# Patient Record
Sex: Male | Born: 1970
Health system: Southern US, Community
[De-identification: ages and names within clinical notes are randomized; demographics above are authoritative.]

## PROBLEM LIST (undated history)

## (undated) DIAGNOSIS — I10 Essential (primary) hypertension: Secondary | ICD-10-CM

## (undated) DIAGNOSIS — I2510 Atherosclerotic heart disease of native coronary artery without angina pectoris: Secondary | ICD-10-CM

## (undated) HISTORY — DX: Atherosclerotic heart disease of native coronary artery without angina pectoris: I25.10

## (undated) HISTORY — PX: KNEE ARTHROSCOPY: SHX127

## (undated) HISTORY — DX: Essential (primary) hypertension: I10

---

## 1998-05-07 ENCOUNTER — Emergency Department (HOSPITAL_COMMUNITY): Admission: EM | Admit: 1998-05-07 | Discharge: 1998-05-07 | Payer: Self-pay | Admitting: Emergency Medicine

## 1998-05-17 ENCOUNTER — Ambulatory Visit (HOSPITAL_BASED_OUTPATIENT_CLINIC_OR_DEPARTMENT_OTHER): Admission: RE | Admit: 1998-05-17 | Discharge: 1998-05-17 | Payer: Self-pay | Admitting: Orthopedic Surgery

## 1999-05-02 ENCOUNTER — Encounter: Admission: RE | Admit: 1999-05-02 | Discharge: 1999-07-31 | Payer: Self-pay | Admitting: Family Medicine

## 2001-02-03 ENCOUNTER — Emergency Department (HOSPITAL_COMMUNITY): Admission: EM | Admit: 2001-02-03 | Discharge: 2001-02-03 | Payer: Self-pay | Admitting: Emergency Medicine

## 2006-03-25 ENCOUNTER — Ambulatory Visit: Payer: Self-pay | Admitting: Family Medicine

## 2006-03-25 ENCOUNTER — Encounter: Admission: RE | Admit: 2006-03-25 | Discharge: 2006-03-25 | Payer: Self-pay | Admitting: Family Medicine

## 2006-04-09 ENCOUNTER — Encounter: Admission: RE | Admit: 2006-04-09 | Discharge: 2006-06-04 | Payer: Self-pay | Admitting: Family Medicine

## 2006-05-16 ENCOUNTER — Ambulatory Visit: Payer: Self-pay | Admitting: Family Medicine

## 2006-05-27 ENCOUNTER — Encounter: Admission: RE | Admit: 2006-05-27 | Discharge: 2006-05-27 | Payer: Self-pay | Admitting: Family Medicine

## 2007-09-06 ENCOUNTER — Ambulatory Visit: Payer: Self-pay | Admitting: Family Medicine

## 2008-01-28 ENCOUNTER — Ambulatory Visit: Payer: Self-pay | Admitting: Internal Medicine

## 2008-01-28 DIAGNOSIS — L989 Disorder of the skin and subcutaneous tissue, unspecified: Secondary | ICD-10-CM | POA: Insufficient documentation

## 2008-01-28 DIAGNOSIS — Z9889 Other specified postprocedural states: Secondary | ICD-10-CM | POA: Insufficient documentation

## 2009-06-12 ENCOUNTER — Emergency Department (HOSPITAL_BASED_OUTPATIENT_CLINIC_OR_DEPARTMENT_OTHER): Admission: EM | Admit: 2009-06-12 | Discharge: 2009-06-12 | Payer: Self-pay | Admitting: Emergency Medicine

## 2009-06-20 ENCOUNTER — Ambulatory Visit: Payer: Self-pay | Admitting: Family Medicine

## 2010-08-14 ENCOUNTER — Ambulatory Visit: Payer: Self-pay | Admitting: Family Medicine

## 2010-08-15 DIAGNOSIS — R21 Rash and other nonspecific skin eruption: Secondary | ICD-10-CM | POA: Insufficient documentation

## 2010-09-28 NOTE — Assessment & Plan Note (Signed)
Summary: KNOTS ON BACK AND CHEST//PH   Vital Signs:  Patient profile:   40 year old male Height:      71 inches Weight:      210.6 pounds BMI:     29.48 Temp:     98.5 degrees F oral BP sitting:   130 / 90  (left arm) Cuff size:   large  Vitals Entered By: Almeta Monas CMA Duncan Dull) (August 15, 2010 3:52 PM) CC: c/o knot on the back---pt noticed it 4 days ago, says it is tender   History of Present Illness: Pt here c/o rash on chest and back that is itchy.  He has not used any otc meds.  No new meds, lotions, soaps etc.  They do have dogs but they are mostly in doors.  He first noticed area on his back and then it spread to his chest.    Current Medications (verified): 1)  Lotrisone 1-0.05 % Crea (Clotrimazole-Betamethasone) .... Apply Two Times A Day  Allergies (verified): No Known Drug Allergies  Past History:  Past Medical History: Last updated: 01/28/2008 hx of HTN 10years ago -- was treated with medication  Family History: Last updated: 01/28/2008 CAD  - no HTN- no stroke - no DM - no colon Ca -no prostate Ca -no  Social History: Last updated: 01/28/2008 Never Smoked Alcohol use-no Drug use-no  Risk Factors: Smoking Status: never (01/28/2008)  Family History: Reviewed history from 01/28/2008 and no changes required. CAD  - no HTN- no stroke - no DM - no colon Ca -no prostate Ca -no  Social History: Reviewed history from 01/28/2008 and no changes required. Never Smoked Alcohol use-no Drug use-no  Review of Systems      See HPI  Physical Exam  General:  Well-developed,well-nourished,in no acute distress; alert,appropriate and cooperative throughout examination Skin:  + Patch scaley papules on L side upper back and same kind of patch on L side upper abd  Cervical Nodes:  No lymphadenopathy noted Psych:  Cognition and judgment appear intact. Alert and cooperative with normal attention span and concentration. No apparent delusions, illusions,  hallucinations   Impression & Recommendations:  Problem # 1:  SKIN RASH (ICD-782.1)  His updated medication list for this problem includes:    Lotrisone 1-0.05 % Crea (Clotrimazole-betamethasone) .Marland Kitchen... Apply two times a day  Discussed medication use and symptom control.   Complete Medication List: 1)  Lotrisone 1-0.05 % Crea (Clotrimazole-betamethasone) .... Apply two times a day Prescriptions: LOTRISONE 1-0.05 % CREA (CLOTRIMAZOLE-BETAMETHASONE) apply two times a day  #30g x 1   Entered and Authorized by:   Loreen Freud DO   Signed by:   Loreen Freud DO on 08/15/2010   Method used:   Electronically to        [facility] [phone]* (retail)       [address]       [place], [place]  [address]       Ph: [phone]       Fax: [phone]   RxID:   [id number]    Orders Added: 1)  Est. Patient Level III [36644]

## 2011-10-26 ENCOUNTER — Ambulatory Visit: Payer: Self-pay | Admitting: Family Medicine

## 2011-11-30 ENCOUNTER — Encounter: Payer: Self-pay | Admitting: Family Medicine

## 2011-11-30 ENCOUNTER — Ambulatory Visit (INDEPENDENT_AMBULATORY_CARE_PROVIDER_SITE_OTHER): Payer: BC Managed Care – PPO | Admitting: Family Medicine

## 2011-11-30 VITALS — BP 128/90 | HR 77 | Temp 98.1°F | Ht 70.0 in | Wt 216.0 lb

## 2011-11-30 DIAGNOSIS — Z23 Encounter for immunization: Secondary | ICD-10-CM

## 2011-11-30 DIAGNOSIS — R5383 Other fatigue: Secondary | ICD-10-CM

## 2011-11-30 DIAGNOSIS — Z0000 Encounter for general adult medical examination without abnormal findings: Secondary | ICD-10-CM

## 2011-11-30 DIAGNOSIS — R5381 Other malaise: Secondary | ICD-10-CM

## 2011-11-30 NOTE — Patient Instructions (Signed)
Preventive Care for Adults, Male A healthy lifestyle and preventative care can promote health and wellness. Preventative health guidelines for men include the following key practices:  A routine yearly physical is a good way to check with your caregiver about your health and preventative screening. It is a chance to share any concerns and updates on your health, and to receive a thorough exam.   Visit your dentist for a routine exam and preventative care every 6 months. Brush your teeth twice a day and floss once a day. Good oral hygiene prevents tooth decay and gum disease.   The frequency of eye exams is based on your age, health, family medical history, use of contact lenses, and other factors. Follow your caregiver's recommendations for frequency of eye exams.   Eat a healthy diet. Foods like vegetables, fruits, whole grains, low-fat dairy products, and lean protein foods contain the nutrients you need without too many calories. Decrease your intake of foods high in solid fats, added sugars, and salt. Eat the right amount of calories for you.Get information about a proper diet from your caregiver, if necessary.   Regular physical exercise is one of the most important things you can do for your health. Most adults should get at least 150 minutes of moderate-intensity exercise (any activity that increases your heart rate and causes you to sweat) each week. In addition, most adults need muscle-strengthening exercises on 2 or more days a week.   Maintain a healthy weight. The body mass index (BMI) is a screening tool to identify possible weight problems. It provides an estimate of body fat based on height and weight. Your caregiver can help determine your BMI, and can help you achieve or maintain a healthy weight.For adults 20 years and older:   A BMI below 18.5 is considered underweight.   A BMI of 18.5 to 24.9 is normal.   A BMI of 25 to 29.9 is considered overweight.   A BMI of 30 and above  is considered obese.   Maintain normal blood lipids and cholesterol levels by exercising and minimizing your intake of saturated fat. Eat a balanced diet with plenty of fruit and vegetables. Blood tests for lipids and cholesterol should begin at age 20 and be repeated every 5 years. If your lipid or cholesterol levels are high, you are over 50, or you are a high risk for heart disease, you may need your cholesterol levels checked more frequently.Ongoing high lipid and cholesterol levels should be treated with medicines if diet and exercise are not effective.   If you smoke, find out from your caregiver how to quit. If you do not use tobacco, do not start.   If you choose to drink alcohol, do not exceed 2 drinks per day. One drink is considered to be 12 ounces (355 mL) of beer, 5 ounces (148 mL) of wine, or 1.5 ounces (44 mL) of liquor.   Avoid use of street drugs. Do not share needles with anyone. Ask for help if you need support or instructions about stopping the use of drugs.   High blood pressure causes heart disease and increases the risk of stroke. Your blood pressure should be checked at least every 1 to 2 years. Ongoing high blood pressure should be treated with medicines, if weight loss and exercise are not effective.   If you are 45 to 42 years old, ask your caregiver if you should take aspirin to prevent heart disease.   Diabetes screening involves taking a blood   sample to check your fasting blood sugar level. This should be done once every 3 years, after age 45, if you are within normal weight and without risk factors for diabetes. Testing should be considered at a younger age or be carried out more frequently if you are overweight and have at least 1 risk factor for diabetes.   Colorectal cancer can be detected and often prevented. Most routine colorectal cancer screening begins at the age of 50 and continues through age 75. However, your caregiver may recommend screening at an earlier  age if you have risk factors for colon cancer. On a yearly basis, your caregiver may provide home test kits to check for hidden blood in the stool. Use of a small camera at the end of a tube, to directly examine the colon (sigmoidoscopy or colonoscopy), can detect the earliest forms of colorectal cancer. Talk to your caregiver about this at age 50, when routine screening begins. Direct examination of the colon should be repeated every 5 to 10 years through age 75, unless early forms of pre-cancerous polyps or small growths are found.   Hepatitis C blood testing is recommended for all people born from 1945 through 1965 and any individual with known risks for hepatitis C.   Practice safe sex. Use condoms and avoid high-risk sexual practices to reduce the spread of sexually transmitted infections (STIs). STIs include gonorrhea, chlamydia, syphilis, trichomonas, herpes, HPV, and human immunodeficiency virus (HIV). Herpes, HIV, and HPV are viral illnesses that have no cure. They can result in disability, cancer, and death.   A one-time screening for abdominal aortic aneurysm (AAA) and surgical repair of large AAAs by sound wave imaging (ultrasonography) is recommended for ages 65 to 75 years who are current or former smokers.   Healthy men should no longer receive prostate-specific antigen (PSA) blood tests as part of routine cancer screening. Consult with your caregiver about prostate cancer screening.   Testicular cancer screening is not recommended for adult males who have no symptoms. Screening includes self-exam, caregiver exam, and other screening tests. Consult with your caregiver about any symptoms you have or any concerns you have about testicular cancer.   Use sunscreen with skin protection factor (SPF) of 30 or more. Apply sunscreen liberally and repeatedly throughout the day. You should seek shade when your shadow is shorter than you. Protect yourself by wearing long sleeves, pants, a  wide-brimmed hat, and sunglasses year round, whenever you are outdoors.   Once a month, do a whole body skin exam, using a mirror to look at the skin on your back. Notify your caregiver of new moles, moles that have irregular borders, moles that are larger than a pencil eraser, or moles that have changed in shape or color.   Stay current with required immunizations.   Influenza. You need a dose every fall (or winter). The composition of the flu vaccine changes each year, so being vaccinated once is not enough.   Pneumococcal polysaccharide. You need 1 to 2 doses if you smoke cigarettes or if you have certain chronic medical conditions. You need 1 dose at age 65 (or older) if you have never been vaccinated.   Tetanus, diphtheria, pertussis (Tdap, Td). Get 1 dose of Tdap vaccine if you are younger than age 65 years, are over 65 and have contact with an infant, are a healthcare worker, or simply want to be protected from whooping cough. After that, you need a Td booster dose every 10 years. Consult your caregiver if   you have not had at least 3 tetanus and diphtheria-containing shots sometime in your life or have a deep or dirty wound.   HPV. This vaccine is recommended for males 13 through 42 years of age. This vaccine may be given to men 22 through 42 years of age who have not completed the 3 dose series. It is recommended for men through age 26 who have sex with men or whose immune system is weakened because of HIV infection, other illness, or medications. The vaccine is given in 3 doses over 6 months.   Measles, mumps, rubella (MMR). You need at least 1 dose of MMR if you were born in 1957 or later. You may also need a 2nd dose.   Meningococcal. If you are age 19 to 21 years and a first-year college student living in a residence hall, or have one of several medical conditions, you need to get vaccinated against meningococcal disease. You may also need additional booster doses.   Zoster (shingles).  If you are age 60 years or older, you should get this vaccine.   Varicella (chickenpox). If you have never had chickenpox or you were vaccinated but received only 1 dose, talk to your caregiver to find out if you need this vaccine.   Hepatitis A. You need this vaccine if you have a specific risk factor for hepatitis A virus infection, or you simply wish to be protected from this disease. The vaccine is usually given as 2 doses, 6 to 18 months apart.   Hepatitis B. You need this vaccine if you have a specific risk factor for hepatitis B virus infection or you simply wish to be protected from this disease. The vaccine is given in 3 doses, usually over 6 months.  Preventative Service / Frequency Ages 19 to 39  Blood pressure check.** / Every 1 to 2 years.   Lipid and cholesterol check.** / Every 5 years beginning at age 20.   Hepatitis C blood test.** / For any individual with known risks for hepatitis C.   Skin self-exam. / Monthly.   Influenza immunization.** / Every year.   Pneumococcal polysaccharide immunization.** / 1 to 2 doses if you smoke cigarettes or if you have certain chronic medical conditions.   Tetanus, diphtheria, pertussis (Tdap,Td) immunization. / A one-time dose of Tdap vaccine. After that, you need a Td booster dose every 10 years.   HPV immunization. / 3 doses over 6 months, if 26 and younger.   Measles, mumps, rubella (MMR) immunization. / You need at least 1 dose of MMR if you were born in 1957 or later. You may also need a 2nd dose.   Meningococcal immunization. / 1 dose if you are age 19 to 21 years and a first-year college student living in a residence hall, or have one of several medical conditions, you need to get vaccinated against meningococcal disease. You may also need additional booster doses.   Varicella immunization.** / Consult your caregiver.   Hepatitis A immunization.** / Consult your caregiver. 2 doses, 6 to 18 months apart.   Hepatitis B  immunization.** / Consult your caregiver. 3 doses usually over 6 months.  Ages 40 to 64  Blood pressure check.** / Every 1 to 2 years.   Lipid and cholesterol check.** / Every 5 years beginning at age 20.   Fecal occult blood test (FOBT) of stool. / Every year beginning at age 50 and continuing until age 75. You may not have to do this test if   you get colonoscopy every 10 years.   Flexible sigmoidoscopy** or colonoscopy.** / Every 5 years for a flexible sigmoidoscopy or every 10 years for a colonoscopy beginning at age 50 and continuing until age 75.   Hepatitis C blood test.** / For all people born from 1945 through 1965 and any individual with known risks for hepatitis C.   Skin self-exam. / Monthly.   Influenza immunization.** / Every year.   Pneumococcal polysaccharide immunization.** / 1 to 2 doses if you smoke cigarettes or if you have certain chronic medical conditions.   Tetanus, diphtheria, pertussis (Tdap/Td) immunization.** / A one-time dose of Tdap vaccine. After that, you need a Td booster dose every 10 years.   Measles, mumps, rubella (MMR) immunization. / You need at least 1 dose of MMR if you were born in 1957 or later. You may also need a 2nd dose.   Varicella immunization.**/ Consult your caregiver.   Meningococcal immunization.** / Consult your caregiver.   Hepatitis A immunization.** / Consult your caregiver. 2 doses, 6 to 18 months apart.   Hepatitis B immunization.** / Consult your caregiver. 3 doses, usually over 6 months.  Ages 65 and over  Blood pressure check.** / Every 1 to 2 years.   Lipid and cholesterol check.**/ Every 5 years beginning at age 20.   Fecal occult blood test (FOBT) of stool. / Every year beginning at age 50 and continuing until age 75. You may not have to do this test if you get colonoscopy every 10 years.   Flexible sigmoidoscopy** or colonoscopy.** / Every 5 years for a flexible sigmoidoscopy or every 10 years for a colonoscopy  beginning at age 50 and continuing until age 75.   Hepatitis C blood test.** / For all people born from 1945 through 1965 and any individual with known risks for hepatitis C.   Abdominal aortic aneurysm (AAA) screening.** / A one-time screening for ages 65 to 75 years who are current or former smokers.   Skin self-exam. / Monthly.   Influenza immunization.** / Every year.   Pneumococcal polysaccharide immunization.** / 1 dose at age 65 (or older) if you have never been vaccinated.   Tetanus, diphtheria, pertussis (Tdap, Td) immunization. / A one-time dose of Tdap vaccine if you are over 65 and have contact with an infant, are a healthcare worker, or simply want to be protected from whooping cough. After that, you need a Td booster dose every 10 years.   Varicella immunization. ** / Consult your caregiver.   Meningococcal immunization.** / Consult your caregiver.   Hepatitis A immunization. ** / Consult your caregiver. 2 doses, 6 to 18 months apart.   Hepatitis B immunization.** / Check with your caregiver. 3 doses, usually over 6 months.  **Family history and personal history of risk and conditions may change your caregiver's recommendations. Document Released: 10/09/2001 Document Revised: 08/02/2011 Document Reviewed: 01/08/2011 [employer] Patient Information 2012 [employer]. 

## 2011-11-30 NOTE — Progress Notes (Signed)
Subjective:    Patient ID: Chad Chase, male    DOB: 1970-11-26, 41 y.o.   MRN: [id number]  HPI Pt here for cpe and labs.  Pt c/o fatigue and is asking about testosterone and other labs.    Review of Systems Review of Systems  Constitutional: Negative for activity change, appetite change and fatigue.  HENT: Negative for hearing loss, congestion, tinnitus and ear discharge.  dentist 1year ago Eyes: Negative for visual disturbance (see optho q3y).  Respiratory: Negative for cough, chest tightness and shortness of breath.   Cardiovascular: Negative for chest pain, palpitations and leg swelling.  Gastrointestinal: Negative for abdominal pain, diarrhea, constipation and abdominal distention.  Genitourinary: Negative for urgency, frequency, decreased urine volume and difficulty urinating.  Musculoskeletal: Negative for back pain, arthralgias and gait problem.  Skin: Negative for color change, pallor and rash.  Neurological: Negative for dizziness, light-headedness, numbness and headaches.  Hematological: Negative for adenopathy. Does not bruise/bleed easily.  Psychiatric/Behavioral: Negative for suicidal ideas, confusion, sleep disturbance, self-injury, dysphoric mood, decreased concentration and agitation.   Past Medical History  Diagnosis Date  . Hypertension     10 years ago--treated with medication   History   Social History  . Marital Status: Married    Spouse Name: N/A    Number of Children: N/A  . Years of Education: N/A   Occupational History  . self employed    Social History Main Topics  . Smoking status: Never Smoker   . Smokeless tobacco: Never Used  . Alcohol Use: No  . Drug Use: No  . Sexually Active: Yes -- Male partner(s)   Other Topics Concern  . Not on file   Social History Narrative  . No narrative on file   Family History  Problem Relation Age of Onset  . Hypertension Mother          Objective:   Physical Exam  BP 128/90  Pulse 77   Temp(Src) 98.1 F (36.7 C) (Oral)  Ht 5\' 10"  (1.778 m)  Wt 216 lb (97.977 kg)  BMI 30.99 kg/m2  SpO2 97%  General Appearance:    Alert, cooperative, no distress, appears stated age  Head:    Normocephalic, without obvious abnormality, atraumatic  Eyes:    PERRL, conjunctiva/corneas clear, EOM's intact, fundi    benign, both eyes       Ears:    Normal TM's and external ear canals, both ears  Nose:   Nares normal, septum midline, mucosa normal, no drainage   or sinus tenderness  Throat:   Lips, mucosa, and tongue normal; teeth and gums normal  Neck:   Supple, symmetrical, trachea midline, no adenopathy;       thyroid:  No enlargement/tenderness/nodules; no carotid   bruit or JVD  Back:     Symmetric, no curvature, ROM normal, no CVA tenderness  Lungs:     Clear to auscultation bilaterally, respirations unlabored  Chest wall:    No tenderness or deformity  Heart:    Regular rate and rhythm, S1 and S2 normal, no murmur, rub   or gallop  Abdomen:     Soft, non-tender, bowel sounds active all four quadrants,    no masses, no organomegaly  Genitalia:    Normal male without lesion, discharge or tenderness  Rectal:    Normal tone, normal prostate, no masses or tenderness;   guaiac negative stool  Extremities:   Extremities normal, atraumatic, no cyanosis or edema  Pulses:   2+ and symmetric  all extremities  Skin:   Skin color, texture, turgor normal, no rashes or lesions  Lymph nodes:   Cervical, supraclavicular, and axillary nodes normal  Neurologic:   CNII-XII intact. Normal strength, sensation and reflexes      throughout        Assessment & Plan:  cpe---  ghm utd            Check labs             Dtap given Elevated BP---watch salt,  Diet and exercise                          Recheck 3 months  Fatigue---check labs, including testosterone

## 2011-12-07 ENCOUNTER — Other Ambulatory Visit: Payer: BC Managed Care – PPO

## 2011-12-07 LAB — POCT URINALYSIS DIPSTICK
Bilirubin, UA: NEGATIVE
Glucose, UA: NEGATIVE
Ketones, UA: NEGATIVE
Leukocytes, UA: NEGATIVE
pH, UA: 5

## 2011-12-07 LAB — BASIC METABOLIC PANEL
Chloride: 104 mEq/L (ref 96–112)
Potassium: 4.2 mEq/L (ref 3.5–5.1)

## 2011-12-07 LAB — TSH: TSH: 1.7 u[IU]/mL (ref 0.35–5.50)

## 2011-12-07 LAB — HEPATIC FUNCTION PANEL
ALT: 26 U/L (ref 0–53)
AST: 32 U/L (ref 0–37)
Bilirubin, Direct: 0.1 mg/dL (ref 0.0–0.3)
Total Protein: 7.2 g/dL (ref 6.0–8.3)

## 2011-12-07 LAB — LDL CHOLESTEROL, DIRECT: Direct LDL: 221.8 mg/dL

## 2011-12-07 LAB — CBC WITH DIFFERENTIAL/PLATELET
Basophils Relative: 0.9 % (ref 0.0–3.0)
Eosinophils Relative: 1.9 % (ref 0.0–5.0)
HCT: 39.8 % (ref 39.0–52.0)
Lymphs Abs: 1.8 10*3/uL (ref 0.7–4.0)
MCV: 88.3 fl (ref 78.0–100.0)
Monocytes Absolute: 0.5 10*3/uL (ref 0.1–1.0)
Neutrophils Relative %: 46.2 % (ref 43.0–77.0)
RBC: 4.51 Mil/uL (ref 4.22–5.81)
WBC: 4.5 10*3/uL (ref 4.5–10.5)

## 2011-12-07 LAB — LIPID PANEL: Triglycerides: 126 mg/dL (ref 0.0–149.0)

## 2011-12-10 LAB — TESTOSTERONE, FREE, TOTAL, SHBG
Sex Hormone Binding: 21 nmol/L (ref 13–71)
Testosterone, Free: 78.8 pg/mL (ref 47.0–244.0)
Testosterone: 314.81 ng/dL (ref 300–890)

## 2012-03-27 ENCOUNTER — Ambulatory Visit: Payer: BC Managed Care – PPO | Admitting: Family Medicine

## 2014-07-27 ENCOUNTER — Ambulatory Visit (INDEPENDENT_AMBULATORY_CARE_PROVIDER_SITE_OTHER): Payer: BC Managed Care – PPO | Admitting: Family Medicine

## 2014-07-27 ENCOUNTER — Encounter: Payer: Self-pay | Admitting: Family Medicine

## 2014-07-27 VITALS — BP 124/80 | HR 54 | Temp 98.4°F | Wt 231.0 lb

## 2014-07-27 DIAGNOSIS — H02823 Cysts of right eye, unspecified eyelid: Secondary | ICD-10-CM

## 2014-07-27 NOTE — Progress Notes (Signed)
   Subjective:    Patient ID: Chad Chase, male    DOB: Oct 26, 1970, 43 y.o.   MRN: [id number]  HPI Pt here c/o bump in eyelid x 3 weeks that is growing and becoming more painful.   Review of Systems     Objective:   Physical Exam        Assessment & Plan:

## 2014-07-27 NOTE — Patient Instructions (Signed)

## 2014-07-27 NOTE — Progress Notes (Signed)
Pre visit review using our clinic review tool, if applicable. No additional management support is needed unless otherwise documented below in the visit note. 

## 2015-09-21 ENCOUNTER — Telehealth: Payer: Self-pay | Admitting: *Deleted

## 2015-09-21 NOTE — Telephone Encounter (Signed)
At time of Pre-Visit Call, pt requests to reschedule appt for tomorrow. Call transferred to Schedulers.

## 2015-09-22 ENCOUNTER — Encounter: Payer: Self-pay | Admitting: Family Medicine

## 2015-09-29 ENCOUNTER — Encounter: Payer: Self-pay | Admitting: Family Medicine

## 2015-09-30 ENCOUNTER — Encounter: Payer: Self-pay | Admitting: Family Medicine

## 2015-09-30 ENCOUNTER — Telehealth: Payer: Self-pay | Admitting: Family Medicine

## 2015-09-30 NOTE — Telephone Encounter (Signed)
Marked to charge and mailing no show letter °

## 2015-09-30 NOTE — Telephone Encounter (Signed)
charge 

## 2015-09-30 NOTE — Telephone Encounter (Signed)
Pt was no show for cpe 09/29/15 9:30am, pt has not rescheduled, charge or no charge?

## 2015-11-28 ENCOUNTER — Telehealth: Payer: Self-pay

## 2015-11-28 NOTE — Telephone Encounter (Signed)
[facility] note received via fax  Call  Date: 11/25/15 Time:  7:17:50 pm  Caller: [employer] Pet[employer]icki Capitano (Spouse) Return number:  [phone]  Nurse: Lutricia HorsfallAmy Lovelace, RN  Chief Complaint:  Abdominal pain  Reason for call:  Caller states husband has cough, sore throat, and abdominal pain since last night.  Cold symptoms last week and took OTC meds.    Related visit to physician within the last 2 weeks: No  Guideline:  Cough-Acute Productive---Cough with cold symptoms  Disposition:  Home Care

## 2015-11-28 NOTE — Telephone Encounter (Signed)
Called to follow up with patient.  Wife answered and said that patient has gone back to work.  He's feeling somewhat better.  Cough improved, but throat still mildly irritated.  She says that she will check on him and if he continues to have symptoms, she will have him call us to schedule an appt.    Message routed to PCP for FYI.

## 2015-11-28 NOTE — Telephone Encounter (Signed)
noted 

## 2015-11-29 ENCOUNTER — Ambulatory Visit: Payer: Self-pay | Admitting: Medical

## 2015-11-29 DIAGNOSIS — J069 Acute upper respiratory infection, unspecified: Secondary | ICD-10-CM | POA: Diagnosis not present

## 2016-06-21 ENCOUNTER — Encounter: Payer: Self-pay | Admitting: Family Medicine

## 2016-06-21 ENCOUNTER — Ambulatory Visit (INDEPENDENT_AMBULATORY_CARE_PROVIDER_SITE_OTHER): Payer: BLUE CROSS/BLUE SHIELD | Admitting: Family Medicine

## 2016-06-21 VITALS — BP 142/100 | HR 67 | Temp 97.8°F | Resp 16 | Ht 72.0 in | Wt 228.8 lb

## 2016-06-21 DIAGNOSIS — Z0000 Encounter for general adult medical examination without abnormal findings: Secondary | ICD-10-CM | POA: Diagnosis not present

## 2016-06-21 DIAGNOSIS — R350 Frequency of micturition: Secondary | ICD-10-CM | POA: Diagnosis not present

## 2016-06-21 DIAGNOSIS — I10 Essential (primary) hypertension: Secondary | ICD-10-CM

## 2016-06-21 LAB — LIPID PANEL
CHOL/HDL RATIO: 8
Cholesterol: 318 mg/dL — ABNORMAL HIGH (ref 0–200)
HDL: 40.2 mg/dL (ref >39.00)
LDL Cholesterol: 257 mg/dL — ABNORMAL HIGH (ref 0–99)
NONHDL: 277.7
Triglycerides: 102 mg/dL (ref 0.0–149.0)
VLDL: 20.4 mg/dL (ref 0.0–40.0)

## 2016-06-21 LAB — COMPREHENSIVE METABOLIC PANEL
ALT: 27 U/L (ref 0–53)
AST: 26 U/L (ref 0–37)
Albumin: 4.3 g/dL (ref 3.5–5.2)
Alkaline Phosphatase: 65 U/L (ref 39–117)
BILIRUBIN TOTAL: 0.4 mg/dL (ref 0.2–1.2)
BUN: 14 mg/dL (ref 6–23)
CO2: 30 meq/L (ref 19–32)
Calcium: 10 mg/dL (ref 8.4–10.5)
Chloride: 103 mEq/L (ref 96–112)
Creatinine, Ser: 1.36 mg/dL (ref 0.40–1.50)
GFR: 72.49 mL/min (ref >60.00)
GLUCOSE: 101 mg/dL — AB (ref 70–99)
Potassium: 4.7 mEq/L (ref 3.5–5.1)
SODIUM: 139 meq/L (ref 135–145)
Total Protein: 7.4 g/dL (ref 6.0–8.3)

## 2016-06-21 LAB — CBC WITH DIFFERENTIAL/PLATELET
BASOS PCT: 0.5 % (ref 0.0–3.0)
Basophils Absolute: 0 10*3/uL (ref 0.0–0.1)
EOS PCT: 0.6 % (ref 0.0–5.0)
Eosinophils Absolute: 0 10*3/uL (ref 0.0–0.7)
HEMATOCRIT: 40.2 % (ref 39.0–52.0)
HEMOGLOBIN: 13.3 g/dL (ref 13.0–17.0)
Lymphocytes Relative: 37.4 % (ref 12.0–46.0)
Lymphs Abs: 2.1 10*3/uL (ref 0.7–4.0)
MCHC: 33 g/dL (ref 30.0–36.0)
MCV: 84.7 fl (ref 78.0–100.0)
MONO ABS: 0.6 10*3/uL (ref 0.1–1.0)
Monocytes Relative: 10.5 % (ref 3.0–12.0)
Neutro Abs: 2.8 10*3/uL (ref 1.4–7.7)
Neutrophils Relative %: 51 % (ref 43.0–77.0)
Platelets: 325 10*3/uL (ref 150.0–400.0)
RBC: 4.75 Mil/uL (ref 4.22–5.81)
RDW: 13.4 % (ref 11.5–15.5)
WBC: 5.5 10*3/uL (ref 4.0–10.5)

## 2016-06-21 LAB — POCT URINALYSIS DIPSTICK
BILIRUBIN UA: NEGATIVE
Glucose, UA: NEGATIVE
KETONES UA: NEGATIVE
Leukocytes, UA: NEGATIVE
Nitrite, UA: NEGATIVE
PH UA: 6
Protein, UA: NEGATIVE
RBC UA: NEGATIVE
Urobilinogen, UA: NEGATIVE

## 2016-06-21 LAB — PSA: PSA: 0.38 ng/mL (ref 0.10–4.00)

## 2016-06-21 LAB — TSH: TSH: 1.22 u[IU]/mL (ref 0.35–4.50)

## 2016-06-21 MED ORDER — AMLODIPINE BESYLATE 5 MG PO TABS: 5.0000 mg | ORAL_TABLET | Freq: Every day | ORAL | 3 refills | Status: DC

## 2016-06-21 NOTE — Progress Notes (Signed)
Patient ID: Chad ConnorsBarry L Chase, male    DOB: [birth year]  Age: 45 y.o. MRN: [id number]    Subjective:  Subjective  HPI Chad Chase presents for c/o frequent urination-- he is concerned about prostate cancer because his father was just diagnosed with prostate cancer.   Pt also concerned about a cough he has had for 3 weeks--- it has calmed down some  Review of Systems  Constitutional: Negative for appetite change, diaphoresis, fatigue and unexpected weight change.  Eyes: Negative for pain, redness and visual disturbance.  Respiratory: Negative for cough, chest tightness, shortness of breath and wheezing.   Cardiovascular: Negative for chest pain, palpitations and leg swelling.  Endocrine: Negative for cold intolerance, heat intolerance, polydipsia, polyphagia and polyuria.  Genitourinary: Negative for difficulty urinating, dysuria and frequency.  Neurological: Negative for dizziness, light-headedness, numbness and headaches.    History Past Medical History:  Diagnosis Date  . Hypertension    10 years ago--treated with medication    He has no past surgical history on file.   His family history includes Hypertension in his mother; Prostate cancer in his father.He reports that he has never smoked. He has never used smokeless tobacco. He reports that he does not drink alcohol or use drugs.  No current outpatient prescriptions on file prior to visit.   No current facility-administered medications on file prior to visit.      Objective:  Objective  Physical Exam  Constitutional: He is oriented to person, place, and time. Vital signs are normal. He appears well-developed and well-nourished. He is sleeping.  HENT:  Head: Normocephalic and atraumatic.  Mouth/Throat: Oropharynx is clear and moist.  Eyes: EOM are normal. Pupils are equal, round, and reactive to light.  Neck: Normal range of motion. Neck supple. No thyromegaly present.  Cardiovascular: Normal rate and regular rhythm.   No  murmur heard. Pulmonary/Chest: Effort normal and breath sounds normal. No respiratory distress. He has no wheezes. He has no rales. He exhibits no tenderness.  Genitourinary: Rectum normal and prostate normal. Rectal exam shows guaiac negative stool.  Musculoskeletal: He exhibits no edema or tenderness.  Neurological: He is alert and oriented to person, place, and time.  Skin: Skin is warm and dry.  Psychiatric: He has a normal mood and affect. His behavior is normal. Judgment and thought content normal.  Nursing note and vitals reviewed.  BP (!) 142/100   Pulse 67   Temp 97.8 F (36.6 C) (Oral)   Resp 16   Ht 6' (1.829 m)   Wt 228 lb 12.8 oz (103.8 kg)   SpO2 96%   BMI 31.03 kg/m  Wt Readings from Last 3 Encounters:  06/21/16 228 lb 12.8 oz (103.8 kg)  07/27/14 231 lb (104.8 kg)  11/30/11 216 lb (98 kg)     Lab Results  Component Value Date   WBC 5.5 06/21/2016   HGB 13.3 06/21/2016   HCT 40.2 06/21/2016   PLT 325.0 06/21/2016   GLUCOSE 101 (H) 06/21/2016   CHOL 318 (H) 06/21/2016   TRIG 102.0 06/21/2016   HDL 40.20 06/21/2016   LDLDIRECT 221.8 12/07/2011   LDLCALC 257 (H) 06/21/2016   ALT 27 06/21/2016   AST 26 06/21/2016   NA 139 06/21/2016   K 4.7 06/21/2016   CL 103 06/21/2016   CREATININE 1.36 06/21/2016   BUN 14 06/21/2016   CO2 30 06/21/2016   TSH 1.22 06/21/2016   PSA 0.38 06/21/2016    No results found.   Assessment &  Plan:  Plan  I am having Chad Chase start on amLODipine.  Meds ordered this encounter  Medications  . amLODipine (NORVASC) 5 MG tablet    Sig: Take 1 tablet (5 mg total) by mouth daily.    Dispense:  30 tablet    Refill:  3    Problem List Items Addressed This Visit    None    Visit Diagnoses    Essential hypertension    -  Primary   Relevant Medications   amLODipine (NORVASC) 5 MG tablet   Other Relevant Orders   Lipid panel (Completed)   Comprehensive metabolic panel (Completed)   Urine frequency       Relevant Orders    POCT Urinalysis Dipstick (Completed)   PSA (Completed)   Preventative health care       Relevant Orders   Lipid panel (Completed)   Comprehensive metabolic panel (Completed)   POCT urinalysis dipstick   PSA (Completed)   TSH (Completed)   CBC with Differential/Platelet (Completed)   Ambulatory referral to Gastroenterology      Follow-up: Return in about 2 weeks (around 07/05/2016) for hypertension.  Schedule cpe as well  Donato Schultz, DO

## 2016-06-21 NOTE — Progress Notes (Signed)
Pre visit review using our clinic review tool, if applicable. No additional management support is needed unless otherwise documented below in the visit note. 

## 2016-06-21 NOTE — Patient Instructions (Signed)
DASH Eating Plan  DASH stands for "Dietary Approaches to Stop Hypertension." The DASH eating plan is a healthy eating plan that has been shown to reduce high blood pressure (hypertension). Additional health benefits may include reducing the risk of type 2 diabetes mellitus, heart disease, and stroke. The DASH eating plan may also help with weight loss.  WHAT DO I NEED TO KNOW ABOUT THE DASH EATING PLAN?  For the DASH eating plan, you will follow these general guidelines:  · Choose foods with a percent daily value for sodium of less than 5% (as listed on the food label).  · Use salt-free seasonings or herbs instead of table salt or sea salt.  · Check with your health care provider or pharmacist before using salt substitutes.  · Eat lower-sodium products, often labeled as "lower sodium" or "no salt added."  · Eat fresh foods.  · Eat more vegetables, fruits, and low-fat dairy products.  · Choose whole grains. Look for the word "whole" as the first word in the ingredient list.  · Choose fish and skinless chicken or turkey more often than red meat. Limit fish, poultry, and meat to 6 oz (170 g) each day.  · Limit sweets, desserts, sugars, and sugary drinks.  · Choose heart-healthy fats.  · Limit cheese to 1 oz (28 g) per day.  · Eat more home-cooked food and less restaurant, buffet, and fast food.  · Limit fried foods.  · Cook foods using methods other than frying.  · Limit canned vegetables. If you do use them, rinse them well to decrease the sodium.  · When eating at a restaurant, ask that your food be prepared with less salt, or no salt if possible.  WHAT FOODS CAN I EAT?  Seek help from a dietitian for individual calorie needs.  Grains  Whole grain or whole wheat bread. Brown rice. Whole grain or whole wheat pasta. Quinoa, bulgur, and whole grain cereals. Low-sodium cereals. Corn or whole wheat flour tortillas. Whole grain cornbread. Whole grain crackers. Low-sodium crackers.  Vegetables  Fresh or frozen vegetables  (raw, steamed, roasted, or grilled). Low-sodium or reduced-sodium tomato and vegetable juices. Low-sodium or reduced-sodium tomato sauce and paste. Low-sodium or reduced-sodium canned vegetables.   Fruits  All fresh, canned (in natural juice), or frozen fruits.  Meat and Other Protein Products  Ground beef (85% or leaner), grass-fed beef, or beef trimmed of fat. Skinless chicken or turkey. Ground chicken or turkey. Pork trimmed of fat. All fish and seafood. Eggs. Dried beans, peas, or lentils. Unsalted nuts and seeds. Unsalted canned beans.  Dairy  Low-fat dairy products, such as skim or 1% milk, 2% or reduced-fat cheeses, low-fat ricotta or cottage cheese, or plain low-fat yogurt. Low-sodium or reduced-sodium cheeses.  Fats and Oils  Tub margarines without trans fats. Light or reduced-fat mayonnaise and salad dressings (reduced sodium). Avocado. Safflower, olive, or canola oils. Natural peanut or almond butter.  Other  Unsalted popcorn and pretzels.  The items listed above may not be a complete list of recommended foods or beverages. Contact your dietitian for more options.  WHAT FOODS ARE NOT RECOMMENDED?  Grains  White bread. White pasta. White rice. Refined cornbread. Bagels and croissants. Crackers that contain trans fat.  Vegetables  Creamed or fried vegetables. Vegetables in a cheese sauce. Regular canned vegetables. Regular canned tomato sauce and paste. Regular tomato and vegetable juices.  Fruits  Dried fruits. Canned fruit in light or heavy syrup. Fruit juice.  Meat and Other Protein   Products  Fatty cuts of meat. Ribs, chicken wings, bacon, sausage, bologna, salami, chitterlings, fatback, hot dogs, bratwurst, and packaged luncheon meats. Salted nuts and seeds. Canned beans with salt.  Dairy  Whole or 2% milk, cream, half-and-half, and cream cheese. Whole-fat or sweetened yogurt. Full-fat cheeses or blue cheese. Nondairy creamers and whipped toppings. Processed cheese, cheese spreads, or cheese  curds.  Condiments  Onion and garlic salt, seasoned salt, table salt, and sea salt. Canned and packaged gravies. Worcestershire sauce. Tartar sauce. Barbecue sauce. Teriyaki sauce. Soy sauce, including reduced sodium. Steak sauce. Fish sauce. Oyster sauce. Cocktail sauce. Horseradish. Ketchup and mustard. Meat flavorings and tenderizers. Bouillon cubes. Hot sauce. Tabasco sauce. Marinades. Taco seasonings. Relishes.  Fats and Oils  Butter, stick margarine, lard, shortening, ghee, and bacon fat. Coconut, palm kernel, or palm oils. Regular salad dressings.  Other  Pickles and olives. Salted popcorn and pretzels.  The items listed above may not be a complete list of foods and beverages to avoid. Contact your dietitian for more information.  WHERE CAN I FIND MORE INFORMATION?  National Heart, Lung, and Blood Institute: www.nhlbi.nih.gov/health/health-topics/topics/dash/     This information is not intended to replace advice given to you by your health care provider. Make sure you discuss any questions you have with your health care provider.     Document Released: 08/02/2011 Document Revised: 09/03/2014 Document Reviewed: 06/17/2013  Elsevier Interactive Patient Education ©2016 Elsevier Inc.

## 2016-06-25 ENCOUNTER — Other Ambulatory Visit: Payer: Self-pay

## 2016-06-25 MED ORDER — ATORVASTATIN CALCIUM 20 MG PO TABS: 20.0000 mg | ORAL_TABLET | Freq: Every day | ORAL | 1 refills | Status: DC

## 2016-07-24 ENCOUNTER — Encounter: Payer: Self-pay | Admitting: Family Medicine

## 2016-08-06 ENCOUNTER — Other Ambulatory Visit: Payer: Self-pay | Admitting: Family Medicine

## 2016-09-02 DIAGNOSIS — R05 Cough: Secondary | ICD-10-CM | POA: Diagnosis not present

## 2016-09-07 ENCOUNTER — Encounter: Payer: Self-pay | Admitting: Medical

## 2016-09-07 ENCOUNTER — Ambulatory Visit (INDEPENDENT_AMBULATORY_CARE_PROVIDER_SITE_OTHER): Payer: BLUE CROSS/BLUE SHIELD | Admitting: Medical

## 2016-09-07 VITALS — BP 116/88 | HR 61 | Temp 98.1°F | Resp 16 | Ht 70.0 in | Wt 212.0 lb

## 2016-09-07 DIAGNOSIS — R059 Cough, unspecified: Secondary | ICD-10-CM

## 2016-09-07 DIAGNOSIS — R05 Cough: Secondary | ICD-10-CM

## 2016-09-07 DIAGNOSIS — J209 Acute bronchitis, unspecified: Secondary | ICD-10-CM | POA: Diagnosis not present

## 2016-09-07 DIAGNOSIS — H6690 Otitis media, unspecified, unspecified ear: Secondary | ICD-10-CM

## 2016-09-07 MED ORDER — ALBUTEROL SULFATE HFA 108 (90 BASE) MCG/ACT IN AERS: 2.0000 | INHALATION_SPRAY | Freq: Four times a day (QID) | RESPIRATORY_TRACT | 0 refills | Status: DC | PRN

## 2016-09-07 MED ORDER — FLUTICASONE PROPIONATE 50 MCG/ACT NA SUSP: 2.0000 | Freq: Every day | NASAL | 1 refills | Status: DC

## 2016-09-07 MED ORDER — AZITHROMYCIN 250 MG PO TABS: ORAL_TABLET | ORAL | 0 refills | Status: DC

## 2016-09-07 NOTE — Patient Instructions (Signed)
You appear to have bronchitis post flu. Rest hydrate and tylenol for fever. Use delsym for cough and  rx azithromycin antibiotic. For your nasal congestion flonase.   You should gradually get better. If not then notify us and would recommend a chest xray.  If any wheezing then making albuterol available.  Follow up in 7-10 days or as needed

## 2016-09-07 NOTE — Progress Notes (Signed)
Subjective:    Patient ID: Chad Chase, male    DOB: [birth year], 46 y.o.   MRN: [id number]  HPI  Pt in for recent flu like syndrome. Pt had body cahes chills, fever and fatigue as well as fatigue. Pt states symptoms for about one week ago. Pt treated for uri by UC(given benzonatate). Then later his son had same signs and symptoms diagnosed.   Overall pt feels a lot better but still productive cough, mild fatigue and occasional sweats.  Pt thinks may wheeze little at night. No hx of asthma.     Review of Systems  Constitutional: Positive for diaphoresis, fatigue and fever.       See hpi.  HENT: Positive for congestion. Negative for sinus pain, sinus pressure and sore throat.   Respiratory: Positive for cough and wheezing. Negative for shortness of breath.        Maybe mild wheeze  Cardiovascular: Negative for chest pain and palpitations.  Gastrointestinal: Negative for abdominal pain.  Musculoskeletal: Negative for back pain.  Skin: Negative for rash.  Hematological: Negative for adenopathy. Does not bruise/bleed easily.  Psychiatric/Behavioral: Negative for behavioral problems and confusion. The patient is not nervous/anxious.     Past Medical History:  Diagnosis Date  . Hypertension    10 years ago--treated with medication     Social History   Social History  . Marital status: Married    Spouse name: N/A  . Number of children: N/A  . Years of education: N/A   Occupational History  . self employed [employer]   Social History Main Topics  . Smoking status: Never Smoker  . Smokeless tobacco: Never Used  . Alcohol use No  . Drug use: No  . Sexual activity: Yes    Partners: Female   Other Topics Concern  . Not on file   Social History Narrative  . No narrative on file    No past surgical history on file.  Family History  Problem Relation Age of Onset  . Hypertension Mother   . Prostate cancer Father     No Known Allergies  Current Outpatient  Prescriptions on File Prior to Visit  Medication Sig Dispense Refill  . amLODipine (NORVASC) 5 MG tablet Take 1 tablet (5 mg total) by mouth daily. 30 tablet 3  . atorvastatin (LIPITOR) 20 MG tablet TAKE 1 TABLET BY MOUTH DAILY 30 tablet 1   No current facility-administered medications on file prior to visit.     BP 116/88 (BP Location: Right Arm, Patient Position: Sitting, Cuff Size: Large)   Pulse 61   Temp 98.1 F (36.7 C) (Oral)   Resp 16   Ht 5\' 10"  (1.778 m)   Wt 212 lb (96.2 kg)   SpO2 98%   BMI 30.42 kg/m       Objective:   Physical Exam  General  Mental Status - Alert. General Appearance - Well groomed. Not in acute distress.  Skin Rashes- No Rashes.  HEENT Head- Normal. Ear Auditory Canal - Left- Normal. Right - Normal.Tympanic Membrane- Left- Normal. Right- Normal. Eye Sclera/Conjunctiva- Left- Normal. Right- mild red. Nose & Sinuses Nasal Mucosa- Left-  Boggy and Congested. Right-  Boggy and  Congested.Bilateral no maxillary and no frontal sinus pressure. Mouth & Throat Lips: Upper Lip- Normal: no dryness, cracking, pallor, cyanosis, or vesicular eruption. Lower Lip-Normal: no dryness, cracking, pallor, cyanosis or vesicular eruption. Buccal Mucosa- Bilateral- No Aphthous ulcers. Oropharynx- No Discharge or Erythema. Tonsils: Characteristics- Bilateral- No Erythema  or Congestion. Size/Enlargement- Bilateral- No enlargement. Discharge- bilateral-None.  Neck Neck- Supple. No Masses.   Chest and Lung Exam Auscultation: Breath Sounds:-Clear even and unlabored.  Cardiovascular Auscultation:Rythm- Regular, rate and rhythm. Murmurs & Other Heart Sounds:Ausculatation of the heart reveal- No Murmurs.  Lymphatic Head & Neck General Head & Neck Lymphatics: Bilateral: Description- No Localized lymphadenopathy.       Assessment & Plan:  You appear to have bronchitis post flu. Rest hydrate and tylenol for fever. Use delsym for cough and  rx azithromycin  antibiotic. For your nasal congestion flonase.   You should gradually get better. If not then notify us and would recommend a chest xray.  If any wheezing then making albuterol available.  Follow up in 7-10 days or as needed  Chidi Shirer, Ramon DredgeEdward, VF CorporationPA-C

## 2016-09-07 NOTE — Progress Notes (Signed)
Pre visit review using our clinic review tool, if applicable. No additional management support is needed unless otherwise documented below in the visit note/SLS  

## 2016-09-25 ENCOUNTER — Other Ambulatory Visit: Payer: BLUE CROSS/BLUE SHIELD

## 2016-10-04 ENCOUNTER — Other Ambulatory Visit: Payer: Self-pay | Admitting: *Deleted

## 2016-10-04 MED ORDER — OSELTAMIVIR PHOSPHATE 75 MG PO CAPS: 75.0000 mg | ORAL_CAPSULE | Freq: Every day | ORAL | 0 refills | Status: DC

## 2016-10-04 NOTE — Telephone Encounter (Signed)
Daughter diagnosed with flu

## 2017-07-21 ENCOUNTER — Encounter (HOSPITAL_COMMUNITY)
Admission: RE | Disposition: A | Discharge status: Home / Self Care | Payer: Self-pay | Source: Home / Self Care | Attending: Cardiovascular Disease

## 2017-07-21 ENCOUNTER — Other Ambulatory Visit: Payer: Self-pay

## 2017-07-21 ENCOUNTER — Emergency Department (HOSPITAL_BASED_OUTPATIENT_CLINIC_OR_DEPARTMENT_OTHER): Payer: BLUE CROSS/BLUE SHIELD

## 2017-07-21 ENCOUNTER — Encounter (HOSPITAL_BASED_OUTPATIENT_CLINIC_OR_DEPARTMENT_OTHER): Payer: Self-pay | Admitting: Emergency Medicine

## 2017-07-21 ENCOUNTER — Inpatient Hospital Stay (HOSPITAL_BASED_OUTPATIENT_CLINIC_OR_DEPARTMENT_OTHER)
Admission: RE | Admit: 2017-07-21 | Discharge: 2017-07-24 | DRG: 247 | Disposition: A | Discharge status: Home / Self Care | Payer: BLUE CROSS/BLUE SHIELD | Attending: Interventional Cardiology | Admitting: Interventional Cardiology

## 2017-07-21 DIAGNOSIS — I2111 ST elevation (STEMI) myocardial infarction involving right coronary artery: Secondary | ICD-10-CM

## 2017-07-21 DIAGNOSIS — R079 Chest pain, unspecified: Secondary | ICD-10-CM | POA: Diagnosis not present

## 2017-07-21 DIAGNOSIS — I5030 Unspecified diastolic (congestive) heart failure: Secondary | ICD-10-CM | POA: Diagnosis not present

## 2017-07-21 DIAGNOSIS — Z7951 Long term (current) use of inhaled steroids: Secondary | ICD-10-CM

## 2017-07-21 DIAGNOSIS — R509 Fever, unspecified: Secondary | ICD-10-CM | POA: Diagnosis not present

## 2017-07-21 DIAGNOSIS — I10 Essential (primary) hypertension: Secondary | ICD-10-CM | POA: Diagnosis not present

## 2017-07-21 DIAGNOSIS — Z6830 Body mass index (BMI) 30.0-30.9, adult: Secondary | ICD-10-CM

## 2017-07-21 DIAGNOSIS — Z79899 Other long term (current) drug therapy: Secondary | ICD-10-CM | POA: Diagnosis not present

## 2017-07-21 DIAGNOSIS — I2119 ST elevation (STEMI) myocardial infarction involving other coronary artery of inferior wall: Secondary | ICD-10-CM

## 2017-07-21 DIAGNOSIS — J90 Pleural effusion, not elsewhere classified: Secondary | ICD-10-CM | POA: Diagnosis not present

## 2017-07-21 DIAGNOSIS — R0602 Shortness of breath: Secondary | ICD-10-CM | POA: Diagnosis not present

## 2017-07-21 DIAGNOSIS — E669 Obesity, unspecified: Secondary | ICD-10-CM | POA: Diagnosis not present

## 2017-07-21 DIAGNOSIS — I252 Old myocardial infarction: Secondary | ICD-10-CM

## 2017-07-21 DIAGNOSIS — I25110 Atherosclerotic heart disease of native coronary artery with unstable angina pectoris: Secondary | ICD-10-CM

## 2017-07-21 DIAGNOSIS — Z8249 Family history of ischemic heart disease and other diseases of the circulatory system: Secondary | ICD-10-CM | POA: Diagnosis not present

## 2017-07-21 DIAGNOSIS — E785 Hyperlipidemia, unspecified: Secondary | ICD-10-CM

## 2017-07-21 DIAGNOSIS — I110 Hypertensive heart disease with heart failure: Secondary | ICD-10-CM | POA: Diagnosis not present

## 2017-07-21 DIAGNOSIS — D72829 Elevated white blood cell count, unspecified: Secondary | ICD-10-CM | POA: Diagnosis not present

## 2017-07-21 DIAGNOSIS — E782 Mixed hyperlipidemia: Secondary | ICD-10-CM | POA: Diagnosis not present

## 2017-07-21 DIAGNOSIS — R9431 Abnormal electrocardiogram [ECG] [EKG]: Secondary | ICD-10-CM | POA: Diagnosis not present

## 2017-07-21 DIAGNOSIS — Z955 Presence of coronary angioplasty implant and graft: Secondary | ICD-10-CM

## 2017-07-21 HISTORY — PX: CORONARY/GRAFT ACUTE MI REVASCULARIZATION: CATH118305

## 2017-07-21 HISTORY — PX: LEFT HEART CATH AND CORONARY ANGIOGRAPHY: CATH118249

## 2017-07-21 LAB — BASIC METABOLIC PANEL
Anion gap: 6 (ref 5–15)
BUN: 11 mg/dL (ref 6–20)
CO2: 27 mmol/L (ref 22–32)
CREATININE: 1.36 mg/dL — AB (ref 0.61–1.24)
Calcium: 9 mg/dL (ref 8.9–10.3)
Chloride: 104 mmol/L (ref 101–111)
GFR calc Af Amer: >60 mL/min (ref >60)
Glucose, Bld: 114 mg/dL — ABNORMAL HIGH (ref 65–99)
Potassium: 3.5 mmol/L (ref 3.5–5.1)
SODIUM: 137 mmol/L (ref 135–145)

## 2017-07-21 LAB — CBC
HCT: 40.7 % (ref 39.0–52.0)
Hemoglobin: 13 g/dL (ref 13.0–17.0)
MCH: 28.4 pg (ref 26.0–34.0)
MCHC: 31.9 g/dL (ref 30.0–36.0)
MCV: 88.9 fL (ref 78.0–100.0)
PLATELETS: 270 10*3/uL (ref 150–400)
RBC: 4.58 MIL/uL (ref 4.22–5.81)
RDW: 12.6 % (ref 11.5–15.5)
WBC: 4.5 10*3/uL (ref 4.0–10.5)

## 2017-07-21 LAB — LIPID PANEL
Cholesterol: 265 mg/dL — ABNORMAL HIGH (ref 0–200)
HDL: 39 mg/dL — AB (ref >40)
LDL CALC: 172 mg/dL — AB (ref 0–99)
Total CHOL/HDL Ratio: 6.8 RATIO
Triglycerides: 270 mg/dL — ABNORMAL HIGH (ref <150)
VLDL: 54 mg/dL — ABNORMAL HIGH (ref 0–40)

## 2017-07-21 LAB — TROPONIN I
TROPONIN I: 1.83 ng/mL — AB (ref <0.03)
Troponin I: <0.03 ng/mL (ref <0.03)

## 2017-07-21 LAB — POCT ACTIVATED CLOTTING TIME
ACTIVATED CLOTTING TIME: 477 s
ACTIVATED CLOTTING TIME: 301 s
ACTIVATED CLOTTING TIME: 147 s
Activated Clotting Time: 252 seconds
Activated Clotting Time: 186 seconds

## 2017-07-21 LAB — PROTIME-INR
INR: 0.97
PROTHROMBIN TIME: 12.8 s (ref 11.4–15.2)

## 2017-07-21 LAB — MRSA PCR SCREENING: MRSA by PCR: NEGATIVE

## 2017-07-21 LAB — APTT: APTT: 26 s (ref 24–36)

## 2017-07-21 SURGERY — CORONARY/GRAFT ACUTE MI REVASCULARIZATION
Anesthesia: LOCAL

## 2017-07-21 MED ORDER — MORPHINE SULFATE (PF) 4 MG/ML IV SOLN
4.0000 mg | Freq: Once | INTRAVENOUS | Status: AC
  Administered 2017-07-21: 4 mg via INTRAVENOUS
  Filled 2017-07-21: qty 1

## 2017-07-21 MED ORDER — ACETAMINOPHEN 325 MG PO TABS: 650.0000 mg | ORAL_TABLET | ORAL | Status: DC | PRN

## 2017-07-21 MED ORDER — TICAGRELOR 90 MG PO TABS
ORAL_TABLET | ORAL | Status: AC
  Filled 2017-07-21: qty 1

## 2017-07-21 MED ORDER — VERAPAMIL HCL 2.5 MG/ML IV SOLN
INTRAVENOUS | Status: AC
  Filled 2017-07-21: qty 2

## 2017-07-21 MED ORDER — SODIUM CHLORIDE 0.9% FLUSH
3.0000 mL | Freq: Two times a day (BID) | INTRAVENOUS | Status: DC
  Administered 2017-07-21 – 2017-07-22 (×3): 3 mL via INTRAVENOUS

## 2017-07-21 MED ORDER — NITROGLYCERIN 0.4 MG SL SUBL
SUBLINGUAL_TABLET | SUBLINGUAL | Status: AC
  Administered 2017-07-21: 0.4 mg via SUBLINGUAL
  Filled 2017-07-21: qty 3

## 2017-07-21 MED ORDER — IOPAMIDOL (ISOVUE-370) INJECTION 76%
INTRAVENOUS | Status: AC
  Filled 2017-07-21: qty 125

## 2017-07-21 MED ORDER — SODIUM CHLORIDE 0.9% FLUSH
3.0000 mL | INTRAVENOUS | Status: DC | PRN
  Administered 2017-07-22: 3 mL via INTRAVENOUS
  Filled 2017-07-21: qty 3

## 2017-07-21 MED ORDER — ASPIRIN 81 MG PO CHEW
CHEWABLE_TABLET | ORAL | Status: AC
  Administered 2017-07-21: 324 mg via ORAL
  Filled 2017-07-21: qty 4

## 2017-07-21 MED ORDER — HEPARIN (PORCINE) IN NACL 2-0.9 UNIT/ML-% IJ SOLN
INTRAMUSCULAR | Status: AC
  Filled 2017-07-21: qty 1000

## 2017-07-21 MED ORDER — FENTANYL CITRATE (PF) 100 MCG/2ML IJ SOLN
INTRAMUSCULAR | Status: DC | PRN
  Administered 2017-07-21: 50 ug via INTRAVENOUS

## 2017-07-21 MED ORDER — TIROFIBAN (AGGRASTAT) BOLUS VIA INFUSION
INTRAVENOUS | Status: DC | PRN
  Administered 2017-07-21: 2437.5 ug via INTRAVENOUS

## 2017-07-21 MED ORDER — IOPAMIDOL (ISOVUE-370) INJECTION 76%
INTRAVENOUS | Status: AC
  Filled 2017-07-21: qty 100

## 2017-07-21 MED ORDER — FENTANYL CITRATE (PF) 100 MCG/2ML IJ SOLN
INTRAMUSCULAR | Status: AC
  Filled 2017-07-21: qty 2

## 2017-07-21 MED ORDER — HEPARIN SODIUM (PORCINE) 1000 UNIT/ML IJ SOLN
INTRAMUSCULAR | Status: DC | PRN
  Administered 2017-07-21: 8000 [IU] via INTRAVENOUS
  Administered 2017-07-21: 3000 [IU] via INTRAVENOUS

## 2017-07-21 MED ORDER — HEPARIN SODIUM (PORCINE) 1000 UNIT/ML IJ SOLN
INTRAMUSCULAR | Status: AC
  Filled 2017-07-21: qty 1

## 2017-07-21 MED ORDER — MIDAZOLAM HCL 2 MG/2ML IJ SOLN
INTRAMUSCULAR | Status: DC | PRN
  Administered 2017-07-21: 1 mg via INTRAVENOUS

## 2017-07-21 MED ORDER — TICAGRELOR 90 MG PO TABS
90.0000 mg | ORAL_TABLET | Freq: Two times a day (BID) | ORAL | Status: DC
  Administered 2017-07-22 – 2017-07-24 (×5): 90 mg via ORAL
  Filled 2017-07-21 (×5): qty 1

## 2017-07-21 MED ORDER — LIDOCAINE HCL (PF) 1 % IJ SOLN
INTRAMUSCULAR | Status: AC
  Filled 2017-07-21: qty 30

## 2017-07-21 MED ORDER — VERAPAMIL HCL 2.5 MG/ML IV SOLN
INTRAVENOUS | Status: DC | PRN
  Administered 2017-07-21: 10 mL via INTRA_ARTERIAL

## 2017-07-21 MED ORDER — TIROFIBAN HCL IN NACL 5-0.9 MG/100ML-% IV SOLN
INTRAVENOUS | Status: AC
  Filled 2017-07-21: qty 100

## 2017-07-21 MED ORDER — OXYCODONE HCL 5 MG PO TABS
5.0000 mg | ORAL_TABLET | ORAL | Status: DC | PRN
  Administered 2017-07-22 (×2): 5 mg via ORAL
  Filled 2017-07-21 (×2): qty 1

## 2017-07-21 MED ORDER — SODIUM CHLORIDE 0.9 % IV SOLN
INTRAVENOUS | Status: AC
  Administered 2017-07-22: 01:00:00 via INTRAVENOUS

## 2017-07-21 MED ORDER — HEPARIN (PORCINE) IN NACL 2-0.9 UNIT/ML-% IJ SOLN
INTRAMUSCULAR | Status: AC | PRN
  Administered 2017-07-21: 1500 mL

## 2017-07-21 MED ORDER — ASPIRIN 81 MG PO CHEW
81.0000 mg | CHEWABLE_TABLET | Freq: Every day | ORAL | Status: DC
  Administered 2017-07-22 – 2017-07-24 (×3): 81 mg via ORAL
  Filled 2017-07-21 (×3): qty 1

## 2017-07-21 MED ORDER — IOPAMIDOL (ISOVUE-370) INJECTION 76%
INTRAVENOUS | Status: DC | PRN
  Administered 2017-07-21: 185 mL via INTRA_ARTERIAL

## 2017-07-21 MED ORDER — TICAGRELOR 90 MG PO TABS
ORAL_TABLET | ORAL | Status: DC | PRN
  Administered 2017-07-21: 180 mg via ORAL

## 2017-07-21 MED ORDER — NITROGLYCERIN 0.4 MG SL SUBL
0.4000 mg | SUBLINGUAL_TABLET | SUBLINGUAL | Status: DC | PRN
  Administered 2017-07-21 (×2): 0.4 mg via SUBLINGUAL

## 2017-07-21 MED ORDER — TIROFIBAN HCL IN NACL 5-0.9 MG/100ML-% IV SOLN
INTRAVENOUS | Status: AC | PRN
  Administered 2017-07-21: 0.15 ug/kg/min via INTRAVENOUS

## 2017-07-21 MED ORDER — HYDRALAZINE HCL 20 MG/ML IJ SOLN
5.0000 mg | INTRAMUSCULAR | Status: AC | PRN
Start: 2017-07-21 — End: 2017-07-22
  Administered 2017-07-21 (×3): 5 mg via INTRAVENOUS
  Filled 2017-07-21 (×2): qty 1

## 2017-07-21 MED ORDER — SODIUM CHLORIDE 0.9 % IV SOLN
INTRAVENOUS | Status: AC | PRN
  Administered 2017-07-21: 100 mL/h via INTRAVENOUS

## 2017-07-21 MED ORDER — ATROPINE SULFATE 1 MG/10ML IJ SOSY
PREFILLED_SYRINGE | INTRAMUSCULAR | Status: AC
  Filled 2017-07-21: qty 10

## 2017-07-21 MED ORDER — TIROFIBAN HCL IN NACL 5-0.9 MG/100ML-% IV SOLN
0.1500 ug/kg/min | INTRAVENOUS | Status: AC
  Administered 2017-07-21 – 2017-07-22 (×3): 0.15 ug/kg/min via INTRAVENOUS
  Filled 2017-07-21 (×3): qty 100

## 2017-07-21 MED ORDER — MIDAZOLAM HCL 2 MG/2ML IJ SOLN
INTRAMUSCULAR | Status: AC
  Filled 2017-07-21: qty 2

## 2017-07-21 MED ORDER — SODIUM CHLORIDE 0.9 % IV BOLUS (SEPSIS)
250.0000 mL | INTRAVENOUS | Status: AC
  Administered 2017-07-21: 250 mL via INTRAVENOUS

## 2017-07-21 MED ORDER — AMLODIPINE BESYLATE 5 MG PO TABS
5.0000 mg | ORAL_TABLET | Freq: Every day | ORAL | Status: DC
  Administered 2017-07-21: 5 mg via ORAL
  Filled 2017-07-21: qty 1

## 2017-07-21 MED ORDER — NITROGLYCERIN 1 MG/10 ML FOR IR/CATH LAB
INTRA_ARTERIAL | Status: AC
  Filled 2017-07-21: qty 10

## 2017-07-21 MED ORDER — ATORVASTATIN CALCIUM 80 MG PO TABS
80.0000 mg | ORAL_TABLET | Freq: Every day | ORAL | Status: DC
  Administered 2017-07-21 – 2017-07-22 (×2): 80 mg via ORAL
  Filled 2017-07-21 (×2): qty 1

## 2017-07-21 MED ORDER — LABETALOL HCL 5 MG/ML IV SOLN: 10.0000 mg | INTRAVENOUS | Status: AC | PRN

## 2017-07-21 MED ORDER — HEPARIN SODIUM (PORCINE) 5000 UNIT/ML IJ SOLN
60.0000 [IU]/kg | Freq: Once | INTRAMUSCULAR | Status: AC
  Administered 2017-07-21: 5850 [IU] via INTRAVENOUS
  Filled 2017-07-21: qty 2

## 2017-07-21 MED ORDER — SODIUM CHLORIDE 0.9 % IV SOLN: 250.0000 mL | INTRAVENOUS | Status: DC | PRN

## 2017-07-21 MED ORDER — LIDOCAINE HCL (PF) 1 % IJ SOLN
INTRAMUSCULAR | Status: DC | PRN
  Administered 2017-07-21: 15 mL
  Administered 2017-07-21: 2 mL

## 2017-07-21 MED ORDER — ONDANSETRON HCL 4 MG/2ML IJ SOLN: 4.0000 mg | Freq: Four times a day (QID) | INTRAMUSCULAR | Status: DC | PRN

## 2017-07-21 MED ORDER — ASPIRIN 81 MG PO CHEW
324.0000 mg | CHEWABLE_TABLET | Freq: Once | ORAL | Status: AC
  Administered 2017-07-21: 324 mg via ORAL

## 2017-07-21 SURGICAL SUPPLY — 28 items
BALLN SAPPHIRE 2.0X20 (BALLOONS) ×2
BALLN SAPPHIRE 2.5X15 (BALLOONS) ×2
BALLN SAPPHIRE ~~LOC~~ 3.0X18 (BALLOONS) ×1 IMPLANT
BALLN SAPPHIRE ~~LOC~~ 4.0X18 (BALLOONS) ×1 IMPLANT
BALLOON SAPPHIRE 2.0X20 (BALLOONS) IMPLANT
BALLOON SAPPHIRE 2.5X15 (BALLOONS) IMPLANT
CATH 5FR JL3.5 JR4 ANG PIG MP (CATHETERS) ×1 IMPLANT
CATH EXTRAC PRONTO 5.5F 138CM (CATHETERS) ×1 IMPLANT
CATH LAUNCHER 6FR JR4 (CATHETERS) ×1 IMPLANT
CATH VISTA GUIDE 6FR AL1 (CATHETERS) ×1 IMPLANT
COVER PRB 48X5XTLSCP FOLD TPE (BAG) IMPLANT
COVER PROBE 5X48 (BAG)
DEVICE RAD COMP TR BAND LRG (VASCULAR PRODUCTS) ×1 IMPLANT
GLIDESHEATH SLEND SS 6F .021 (SHEATH) ×1 IMPLANT
GUIDELINER 6F (CATHETERS) ×1 IMPLANT
GUIDEWIRE INQWIRE 1.5J.035X260 (WIRE) IMPLANT
INQWIRE 1.5J .035X260CM (WIRE) ×2
KIT ENCORE 26 ADVANTAGE (KITS) ×1 IMPLANT
KIT HEART LEFT (KITS) ×2 IMPLANT
PACK CARDIAC CATHETERIZATION (CUSTOM PROCEDURE TRAY) ×2 IMPLANT
SHEATH PINNACLE 6F 10CM (SHEATH) ×1 IMPLANT
STENT SYNERGY DES 2.75X24 (Permanent Stent) ×1 IMPLANT
STENT SYNERGY DES 3X24 (Permanent Stent) ×1 IMPLANT
SYR MEDRAD MARK V 150ML (SYRINGE) ×2 IMPLANT
TRANSDUCER W/STOPCOCK (MISCELLANEOUS) ×2 IMPLANT
TUBING CIL FLEX 10 FLL-RA (TUBING) ×2 IMPLANT
WIRE COUGAR XT STRL 190CM (WIRE) ×1 IMPLANT
WIRE EMERALD 3MM-J .035X150CM (WIRE) ×1 IMPLANT

## 2017-07-21 NOTE — ED Notes (Signed)
Repeat ekg (#3) completed and given to Dr. Anitra LauthPlunkett to review

## 2017-07-21 NOTE — Progress Notes (Signed)
   07/21/17 1700  Clinical Encounter Type  Visited With Family  Visit Type ED  Referral From Nurse  Consult/Referral To Chaplain  Stress Factors  Family Stress Factors Health changes   Responded to a page for a Code Stemi.  Upon arrival patient taken straight to Cath Lab.  Was paged when spouse arrived.  Escorted her to the waiting area and alerted the Cath Team that family had arrives.  I offered support to the spouse and prayer.  Will follow as needed. Chaplain Agustin CreeNewton Breonia Kirstein

## 2017-07-21 NOTE — ED Triage Notes (Signed)
Patient comes in today have SOB and having chest pain and sweating. Patient reports that this started about an hour ago

## 2017-07-21 NOTE — ED Notes (Signed)
Report called to Janan HalterJamie Blue, RN [facility]

## 2017-07-21 NOTE — ED Provider Notes (Signed)
[facility] EMERGENCY DEPARTMENT Provider Note   CSN: [id number] Arrival date & time: 07/21/17  1406     History   Chief Complaint Chief Complaint  Patient presents with  . Chest Pain    HPI Fransisca ConnorsBarry L Gullion is a 46 y.o. male.  Patient is a [age] year old male with a history of hypertension who after losing weight when off medications about 6 months ago presenting today with sudden onset of chest pain while playing Wii.  Patient states that when the pain started it was a heavy pressure more in the left side of his chest which caused him to have shortness of breath, sweating and not feeling good.  States the pain has not radiated.  He tried to give it up and move around which seem to make it worse.  The pain is been persistent for the last hour and a half.  He has never had symptoms like this before.  He felt fine this morning.  He takes no medications regularly and denies any tobacco or alcohol use.  He does not abuse drugs.  Since arriving here the sweating and feeling dizzy has improved but he still continues to have chest pain and mild shortness of breath.   The history is provided by the patient.  Chest Pain   This is a new problem. The current episode started 1 to 2 hours ago. The problem occurs constantly. The problem has not changed since onset.The pain is associated with exertion. The pain is present in the substernal region. The pain is at a severity of 8/10. The pain is moderate. The quality of the pain is described as pressure-like and heavy. The pain does not radiate. Duration of episode(s) is 2 hours. The symptoms are aggravated by exertion. Associated symptoms include diaphoresis and shortness of breath. Pertinent negatives include no abdominal pain, no back pain, no fever, no headaches, no lower extremity edema and no nausea. He has tried rest for the symptoms. The treatment provided no relief. Risk factors include male gender (hx of HTN but lost weight and not taken med  for about 5mo).  His past medical history is significant for hypertension.  His family medical history is significant for diabetes.  Pertinent negatives for family medical history include: no CAD.    Past Medical History:  Diagnosis Date  . Hypertension    10 years ago--treated with medication    Patient Active Problem List   Diagnosis Date Noted  . SKIN RASH 08/15/2010  . SKIN LESION 01/28/2008  . ARTHROSCOPY, LEFT KNEE, HX OF 01/28/2008    History reviewed. No pertinent surgical history.     Home Medications    Prior to Admission medications   Medication Sig Start Date End Date Taking? Authorizing Provider  albuterol (PROVENTIL HFA;VENTOLIN HFA) 108 (90 Base) MCG/ACT inhaler Inhale 2 puffs into the lungs every 6 (six) hours as needed for wheezing or shortness of breath. 09/07/16   Saguier, Ramon DredgeEdward, PA-C  amLODipine (NORVASC) 5 MG tablet Take 1 tablet (5 mg total) by mouth daily. 06/21/16   Seabron SpatesLowne Chase, Yvonne R, DO  atorvastatin (LIPITOR) 20 MG tablet TAKE 1 TABLET BY MOUTH DAILY 08/06/16   Zola ButtonLowne Chase, Grayling CongressYvonne R, DO  azithromycin (ZITHROMAX) 250 MG tablet Take 2 tablets by mouth on day 1, followed by 1 tablet by mouth daily for 4 days. 09/07/16   Saguier, Ramon DredgeEdward, PA-C  benzonatate (TESSALON) 100 MG capsule Take by mouth 2 (two) times daily as needed for cough.    [provider]  fluticasone (FLONASE) 50 MCG/ACT nasal spray Place 2 sprays into both nostrils daily. 09/07/16   Saguier, Ramon Dredge, PA-C  oseltamivir (TAMIFLU) 75 MG capsule Take 1 capsule (75 mg total) by mouth daily. For 10 days 10/04/16   Donato Schultz, DO    Family History Family History  Problem Relation Age of Onset  . Hypertension Mother   . Prostate cancer Father     Social History Social History   Tobacco Use  . Smoking status: Never Smoker  . Smokeless tobacco: Never Used  Substance Use Topics  . Alcohol use: No  . Drug use: No     Allergies   Patient has no known  allergies.   Review of Systems Review of Systems  Constitutional: Positive for diaphoresis. Negative for fever.  Respiratory: Positive for shortness of breath.   Cardiovascular: Positive for chest pain.  Gastrointestinal: Negative for abdominal pain and nausea.  Musculoskeletal: Negative for back pain.  Neurological: Negative for headaches.  All other systems reviewed and are negative.    Physical Exam Updated Vital Signs BP (!) 185/104 (BP Location: Left Arm)   Pulse (!) 55   Temp 97.6 F (36.4 C) (Oral)   Resp 18   Ht 5\' 11"  (1.803 m)   Wt 97.5 kg (215 lb)   SpO2 100%   BMI 29.99 kg/m   Physical Exam  Constitutional: He is oriented to person, place, and time. He appears well-developed and well-nourished. No distress.  HENT:  Head: Normocephalic and atraumatic.  Mouth/Throat: Oropharynx is clear and moist.  Eyes: Conjunctivae and EOM are normal. Pupils are equal, round, and reactive to light.  Neck: Normal range of motion. Neck supple.  Cardiovascular: Normal rate, regular rhythm and intact distal pulses.  No murmur heard. Pulmonary/Chest: Effort normal and breath sounds normal. No respiratory distress. He has no wheezes. He has no rales.  Abdominal: Soft. He exhibits no distension. There is no tenderness. There is no rebound and no guarding.  Musculoskeletal: Normal range of motion. He exhibits no edema or tenderness.  Neurological: He is alert and oriented to person, place, and time.  Skin: Skin is warm and dry. No rash noted. No erythema.  Psychiatric: He has a normal mood and affect. His behavior is normal.  Nursing note and vitals reviewed.    ED Treatments / Results  Labs (all labs ordered are listed, but only abnormal results are displayed) Labs Reviewed  BASIC METABOLIC PANEL - Abnormal; Notable for the following components:      Result Value   Glucose, Bld 114 (*)    Creatinine, Ser 1.36 (*)    All other components within normal limits  CBC  TROPONIN  I  PROTIME-INR  APTT  LIPID PANEL    EKG  EKG Interpretation  Date/Time:  Sunday July 21 2017 14:08:27 EST Ventricular Rate:  60 PR Interval:  178 QRS Duration: 92 QT Interval:  400 QTC Calculation: 400 R Axis:   36 Text Interpretation:  Normal sinus rhythm Normal ECG No previous tracing Confirmed by Gwyneth Sprout ([id number]) on 07/21/2017 2:24:39 PM       Radiology Dg Chest 2 View  Result Date: 07/21/2017 CLINICAL DATA:  Chest pain, shortness of breath. EXAM: CHEST  2 VIEW COMPARISON:  None. FINDINGS: The heart size and mediastinal contours are within normal limits. Both lungs are clear. No pneumothorax or pleural effusion is noted. The visualized skeletal structures are unremarkable. IMPRESSION: No active cardiopulmonary disease. Electronically Signed   By:  Lupita RaiderJames  Green Jr, M.D.   On: 07/21/2017 14:32    Procedures Procedures (including critical care time)  Medications Ordered in ED Medications  nitroGLYCERIN (NITROSTAT) SL tablet 0.4 mg (0.4 mg Sublingual Given 07/21/17 1447)  heparin injection 5,850 Units (not administered)  aspirin chewable tablet 324 mg (324 mg Oral Given 07/21/17 1428)  morphine 4 MG/ML injection 4 mg (4 mg Intravenous Given 07/21/17 1506)     Initial Impression / Assessment and Plan / ED Course  I have reviewed the triage vital signs and the nursing notes.  Pertinent labs & imaging results that were available during my care of the patient were reviewed by me and considered in my medical decision making (see chart for details).     Pt with symptoms concerning for ACS vs htn emergency.  Heart score 3. Associated symptoms include diaphoresis, sOB and dizziness.  PERC neg. patient has no abdominal pain or GI symptoms.  Pain does not radiate suggestive of dissection.  Low suspicion for PE or infectious etiology.  Possible that this could be a spontaneous pneumothorax and chest x-ray is pending.  ASA and NTG given. EKG wnl, CXR, CBC, BMP, trop  pending.  2:58 PM CXR wnl.  CBC and BMP wnl.  Trop wnl.  Pt was not pain free after NTG but blood pressure did improve.  Will given morphine.  3:16 PM Pain improved to 3/10.  Repeat EKG with mild changes in the inferior leads.  Will discuss with interventional cards.  3:28 PM Cardiology agreed to call code STEMI.  Trop and BMP without acute findings.  Pt given heparin and Code STEMI activated.  CRITICAL CARE Performed by: Denelle Capurro Total critical care time: 30 minutes Critical care time was exclusive of separately billable procedures and treating other patients. Critical care was necessary to treat or prevent imminent or life-threatening deterioration. Critical care was time spent personally by me on the following activities: development of treatment plan with patient and/or surrogate as well as nursing, discussions with consultants, evaluation of patient's response to treatment, examination of patient, obtaining history from patient or surrogate, ordering and performing treatments and interventions, ordering and review of laboratory studies, ordering and review of radiographic studies, pulse oximetry and re-evaluation of patient's condition.     Final Clinical Impressions(s) / ED Diagnoses   Final diagnoses:  ST elevation myocardial infarction (STEMI) involving other coronary artery of inferior wall [facility](HCC)    ED Discharge Orders    None       Gwyneth SproutPlunkett, Jhamal Plucinski, MD 07/21/17 1529

## 2017-07-21 NOTE — ED Notes (Signed)
Attempt to call cath lab, no answer. Asher MuirJamie, RN (charge nurse [place]) aware [facility]

## 2017-07-21 NOTE — ED Notes (Signed)
Repeat ekg done per VORB from Dr. Anitra LauthPlunkett and given to her for review

## 2017-07-21 NOTE — Progress Notes (Addendum)
Site area: right groin  Site Prior to Removal:  Level 2  Pressure Applied For 40 MINUTES by Margaretann LovelessMadeline Shafter Jupin RN & Hillery AldoShanna Stowe RN   Minutes Beginning at 2237  Manual:   Yes.    Patient Status During Pull:  Unstable, BP dropped to 80s SBP, HR dipped into 50s, required 250cc bolus, paused angiomax    Post Pull Groin Site:  Level 1  Post Pull Instructions Given:  Yes.    Post Pull Pulses Present:  Yes.    Dressing Applied:  Yes.  Pressure dressing applied per MD request  Comments:  Dr. Vonzella NippleWosik updated on patient condition throughout sheath pull process; orders to keep Aggrastat paused for another hour (resart ~0015). Pt vagaled prior to pull and was symptomatic (diaphoretic and "felt funny"), but recovered well with 250cc bolus. Pt is now hemodynamically stable post sheath pull, pt and family at bedside able to verbalize mobility restrictions and education.   Bedrest beginning at 2307.

## 2017-07-21 NOTE — H&P (Signed)
Cardiology Admission History and Physical:   Patient ID: Chad Chase; MRN: [id number]; DOB: 02/05/1971   Admission date: 07/21/2017  Primary Care Provider: Zola ButtonLowne Chase, Grayling CongressYvonne R, DO Primary Cardiologist: Havery MorosNew (Eboni Coval)  Chief Complaint:  Inferior STEMI/chest pain  Patient Profile:   Chad Chase is a 46 y.o. male with a history of HTN who presented to the [facility] today with c/o chest pain, diaphoresis and dyspnea. This started 1 hour prior to arrival in the ED. Initial EKG did not show an injury current. Subsequent EKG showed 1 mm ST elevation in the inferior leads. Code STEMI was activated.   History of Present Illness:   Chad Chase is 46 yo male with a history of HTN who presented to the [facility] today with c/o chest pain, diaphoresis and dyspnea. This started 1 hour prior to arrival in the ED. Initial EKG did not show an injury current. Subsequent EKG showed 1 mm ST elevation in the inferior leads. Code STEMI was activated. He was given NTG, heparin, ASA and morphine in the ED. [facility], he was still having mild chest pain. He stopped taking his Norvasc several months ago after losing weight.    Past Medical History:  Diagnosis Date  . Hypertension    10 years ago--treated with medication    Past Surgical History:  Procedure Laterality Date  . KNEE ARTHROSCOPY       Medications Prior to Admission: None  Allergies:   No Known Allergies  Social History:   Social History   Socioeconomic History  . Marital status: Married    Spouse name: Not on file  . Number of children: Not on file  . Years of education: Not on file  . Highest education level: Not on file  Social Needs  . Financial resource strain: Not on file  . Food insecurity - worry: Not on file  . Food insecurity - inability: Not on file  . Transportation needs - medical: Not on file  . Transportation needs - non-medical: Not on file  Occupational History  .  Occupation: self employed    Associate Professormployer: [employer]  Tobacco Use  . Smoking status: Never Smoker  . Smokeless tobacco: Never Used  Substance and Sexual Activity  . Alcohol use: No  . Drug use: No  . Sexual activity: Yes    Partners: Female  Other Topics Concern  . Not on file  Social History Narrative  . Not on file    Family History:   The patient's family history includes Hypertension in his mother; Prostate cancer in his father.    ROS:  Please see the history of present illness.  All other ROS reviewed and negative.     Physical Exam/Data:   Vitals:   07/21/17 1457 07/21/17 1501 07/21/17 1540 07/21/17 1540  BP: 131/85 (!) 134/91 (!) 165/102 (!) 165/102  Pulse: 67 72 60 (!) 59  Resp: (!) 26 16 13 18   Temp:      TempSrc:      SpO2: 96% 95% 100% 98%  Weight:      Height:       No intake or output data in the 24 hours ending 07/21/17 1620 Filed Weights   07/21/17 1409  Weight: 215 lb (97.5 kg)   Body mass index is 29.99 kg/m.  General:  Well nourished, well developed, appears uncomfortable HEENT: normal Lymph: no adenopathy Neck: no JVD Endocrine:  No thryomegaly Vascular: No carotid bruits;  FA pulses 2+ bilaterally without bruits  Cardiac:  normal S1, S2; RRR; no murmur  Lungs:  clear to auscultation bilaterally, no wheezing, rhonchi or rales  Abd: soft, nontender, no hepatomegaly  Ext: no LE edema Musculoskeletal:  No deformities, BUE and BLE strength normal and equal Skin: warm and dry  Neuro:  CNs 2-12 intact, no focal abnormalities noted Psych:  Normal affect    EKG:  The ECG that was done was personally reviewed and demonstrates sinus rhythm with flattening of the T waves in the lateral leads and 1 mm ST elevation in the inferior leads.   Relevant CV Studies: None  Laboratory Data:  Chemistry Recent Labs  Lab 07/21/17 1434  NA 137  K 3.5  CL 104  CO2 27  GLUCOSE 114*  BUN 11  CREATININE 1.36*  CALCIUM 9.0  GFRNONAA >60  GFRAA >60    ANIONGAP 6    No results for input(s): PROT, ALBUMIN, AST, ALT, ALKPHOS, BILITOT in the last 168 hours. Hematology Recent Labs  Lab 07/21/17 1434  WBC 4.5  RBC 4.58  HGB 13.0  HCT 40.7  MCV 88.9  MCH 28.4  MCHC 31.9  RDW 12.6  PLT 270   Cardiac Enzymes Recent Labs  Lab 07/21/17 1434  TROPONINI <0.03   No results for input(s): TROPIPOC in the last 168 hours.  BNPNo results for input(s): BNP, PROBNP in the last 168 hours.  DDimer No results for input(s): DDIMER in the last 168 hours.  Radiology/Studies:  Dg Chest 2 View  Result Date: 07/21/2017 CLINICAL DATA:  Chest pain, shortness of breath. EXAM: CHEST  2 VIEW COMPARISON:  None. FINDINGS: The heart size and mediastinal contours are within normal limits. Both lungs are clear. No pneumothorax or pleural effusion is noted. The visualized skeletal structures are unremarkable. IMPRESSION: No active cardiopulmonary disease. Electronically Signed   By: Lupita Raider, M.D.   On: 07/21/2017 14:32    Assessment and Plan:   1. Inferior STEMI: Pt is presenting with chest pain and there is inferior ST elevation on his EKG. Plan emergent cardiac cath with probable PCI. Further plans to follow after cath.   2. HTN: Will resume anti-hypertensive medications.   Severity of Illness: The appropriate patient status for this patient is INPATIENT. Inpatient status is judged to be reasonable and necessary in order to provide the required intensity of service to ensure the patient's safety. The patient's presenting symptoms, physical exam findings, and initial radiographic and laboratory data in the context of their chronic comorbidities is felt to place them at high risk for further clinical deterioration. Furthermore, it is not anticipated that the patient will be medically stable for discharge from the hospital within 2 midnights of admission. The following factors support the patient status of inpatient.   " The patient's presenting  symptoms include chest pain/inferior STEMI. " The worrisome physical exam findings include ST elevation MI on EKG " The initial radiographic and laboratory data are worrisome because of his inferior MI " The chronic co-morbidities include HTN, obesity   * I certify that at the point of admission it is my clinical judgment that the patient will require inpatient hospital care spanning beyond 2 midnights from the point of admission due to high intensity of service, high risk for further deterioration and high frequency of surveillance required.*    For questions or updates, please contact [facility] Please consult [email or web address] for contact info under Cardiology/STEMI.    Signed, Verne Carrow, MD  07/21/2017 4:20 PM

## 2017-07-22 ENCOUNTER — Inpatient Hospital Stay (HOSPITAL_COMMUNITY): Payer: BLUE CROSS/BLUE SHIELD

## 2017-07-22 ENCOUNTER — Encounter (HOSPITAL_COMMUNITY): Payer: Self-pay | Admitting: Cardiovascular Disease

## 2017-07-22 DIAGNOSIS — I110 Hypertensive heart disease with heart failure: Secondary | ICD-10-CM

## 2017-07-22 DIAGNOSIS — R9431 Abnormal electrocardiogram [ECG] [EKG]: Secondary | ICD-10-CM

## 2017-07-22 DIAGNOSIS — I5030 Unspecified diastolic (congestive) heart failure: Secondary | ICD-10-CM

## 2017-07-22 LAB — BASIC METABOLIC PANEL
ANION GAP: 10 (ref 5–15)
BUN: 9 mg/dL (ref 6–20)
CHLORIDE: 102 mmol/L (ref 101–111)
CO2: 21 mmol/L — AB (ref 22–32)
Calcium: 8.9 mg/dL (ref 8.9–10.3)
Creatinine, Ser: 1.1 mg/dL (ref 0.61–1.24)
GFR calc non Af Amer: >60 mL/min (ref >60)
Glucose, Bld: 138 mg/dL — ABNORMAL HIGH (ref 65–99)
POTASSIUM: 3.6 mmol/L (ref 3.5–5.1)
Sodium: 133 mmol/L — ABNORMAL LOW (ref 135–145)

## 2017-07-22 LAB — CBC
HCT: 39.9 % (ref 39.0–52.0)
HEMOGLOBIN: 13.1 g/dL (ref 13.0–17.0)
MCH: 28.7 pg (ref 26.0–34.0)
MCHC: 32.8 g/dL (ref 30.0–36.0)
MCV: 87.5 fL (ref 78.0–100.0)
Platelets: 245 10*3/uL (ref 150–400)
RBC: 4.56 MIL/uL (ref 4.22–5.81)
RDW: 12.9 % (ref 11.5–15.5)
WBC: 11.8 10*3/uL — ABNORMAL HIGH (ref 4.0–10.5)

## 2017-07-22 LAB — PROTIME-INR
INR: 0.94
Prothrombin Time: 12.5 seconds (ref 11.4–15.2)

## 2017-07-22 LAB — ECHOCARDIOGRAM COMPLETE
HEIGHTINCHES: 71 in
Weight: 3456.81 oz

## 2017-07-22 LAB — TROPONIN I
TROPONIN I: 7.59 ng/mL — AB (ref <0.03)
Troponin I: 3.31 ng/mL (ref <0.03)

## 2017-07-22 MED ORDER — HYDRALAZINE HCL 20 MG/ML IJ SOLN
INTRAMUSCULAR | Status: AC
  Administered 2017-07-22: 10 mg via INTRAVENOUS
  Filled 2017-07-22: qty 1

## 2017-07-22 MED ORDER — AMLODIPINE BESYLATE 5 MG PO TABS
5.0000 mg | ORAL_TABLET | Freq: Every day | ORAL | Status: DC
  Administered 2017-07-22 – 2017-07-24 (×3): 5 mg via ORAL
  Filled 2017-07-22 (×4): qty 1

## 2017-07-22 MED ORDER — MORPHINE SULFATE (PF) 4 MG/ML IV SOLN
1.0000 mg | INTRAVENOUS | Status: DC | PRN
  Administered 2017-07-22 (×3): 4 mg via INTRAVENOUS
  Administered 2017-07-22: 2 mg via INTRAVENOUS
  Administered 2017-07-23: 4 mg via INTRAVENOUS
  Filled 2017-07-22 (×5): qty 1

## 2017-07-22 MED ORDER — CARVEDILOL 6.25 MG PO TABS
6.2500 mg | ORAL_TABLET | Freq: Two times a day (BID) | ORAL | Status: DC
  Administered 2017-07-22 – 2017-07-23 (×3): 6.25 mg via ORAL
  Filled 2017-07-22 (×3): qty 1

## 2017-07-22 MED ORDER — HYDRALAZINE HCL 20 MG/ML IJ SOLN
10.0000 mg | INTRAMUSCULAR | Status: DC | PRN
  Administered 2017-07-22 – 2017-07-23 (×2): 20 mg via INTRAVENOUS
  Filled 2017-07-22 (×2): qty 1

## 2017-07-22 MED ORDER — HYDRALAZINE HCL 20 MG/ML IJ SOLN
10.0000 mg | Freq: Four times a day (QID) | INTRAMUSCULAR | Status: DC | PRN
  Administered 2017-07-22: 10 mg via INTRAVENOUS

## 2017-07-22 MED FILL — Nitroglycerin IV Soln 100 MCG/ML in D5W: INTRA_ARTERIAL | Qty: 10 | Status: AC

## 2017-07-22 NOTE — Progress Notes (Signed)
  Echocardiogram 2D Echocardiogram has been performed.  Jamiya Nims T Bogdan Vivona 07/22/2017, 10:55 AM

## 2017-07-22 NOTE — Progress Notes (Signed)
EKG CRITICAL VALUE     12 lead EKG performed.  Critical value noted.  Margaretann LovelessMadeline Himes, RN notified.   Sufian Ravi H, CCT 07/22/2017 7:03 AM

## 2017-07-22 NOTE — Plan of Care (Signed)
  Progressing Education: Understanding of cardiac disease, CV risk reduction, and recovery process will improve 07/22/2017 0140 - Progressing by Joycelyn ManHimes, Yandiel Bergum M, RN Understanding of medication regimen will improve 07/22/2017 0140 - Progressing by Joycelyn ManHimes, Yamaira Spinner M, RN Cardiac: Ability to achieve and maintain adequate cardiopulmonary perfusion will improve 07/22/2017 0140 - Progressing by Joycelyn ManHimes, Idara Woodside M, RN Vascular access site(s) Level 0-1 will be maintained 07/22/2017 0140 - Progressing by Joycelyn ManHimes, Jamiya Nims M, RN Note Complications noted in progress note earlier this shift. Currently both vascular sites are stable and level 0-1.  Health Behavior/Discharge Planning: Ability to safely manage health-related needs after discharge will improve 07/22/2017 0140 - Progressing by Joycelyn ManHimes, Jeramey Lanuza M, RN Education: Knowledge of General Education information will improve 07/22/2017 0140 - Progressing by Joycelyn ManHimes, Loel Betancur M, RN Health Behavior/Discharge Planning: Ability to manage health-related needs will improve 07/22/2017 0140 - Progressing by Joycelyn ManHimes, Judea Fennimore M, RN Safety: Ability to remain free from injury will improve 07/22/2017 0140 - Progressing by Joycelyn ManHimes, Carter Kaman M, RN   Not Progressing Activity: Ability to tolerate increased activity will improve 07/22/2017 0140 - Not Progressing by Joycelyn ManHimes, Elyzabeth Goatley M, RN Note Not applicable at this time: pt on strict bedrest after femoral sheath removal. Up time is ~0500.

## 2017-07-22 NOTE — H&P (View-Only) (Signed)
Progress Note  Patient Name: Chad Chase Date of Encounter: 07/22/2017  Primary Cardiologist: Ellis Parents Clifton James)  Subjective   Patient states he had recurrence of substernal chest pain around midnight last night with no other associated symptoms; this is similar pain to his STEMI yesterday prior to cath, but not as severe. He denies shortness of breath, palpitations, dizziness.  Inpatient Medications    Scheduled Meds: . amLODipine  5 mg Oral Daily  . aspirin  81 mg Oral Daily  . atorvastatin  80 mg Oral Daily  . atropine      . sodium chloride flush  3 mL Intravenous Q12H  . ticagrelor  90 mg Oral BID   Continuous Infusions: . sodium chloride    . tirofiban 0.15 mcg/kg/min (07/22/17 0546)   PRN Meds: sodium chloride, acetaminophen, hydrALAZINE, ondansetron (ZOFRAN) IV, oxyCODONE, sodium chloride flush   Vital Signs    Vitals:   07/22/17 0645 07/22/17 0700 07/22/17 0800 07/22/17 0900  BP: (!) 172/115 (!) 182/112 (!) 176/108 (!) 160/112  Pulse: 97 100 97 97  Resp: (!) 23 (!) 21 (!) 26 20  Temp:   98.8 F (37.1 C)   TempSrc:   Oral   SpO2: 99% 100% 100% 100%  Weight:      Height:        Intake/Output Summary (Last 24 hours) at 07/22/2017 0918 Last data filed at 07/22/2017 0800 Gross per 24 hour  Intake 1333.87 ml  Output 1550 ml  Net -216.13 ml   Filed Weights   07/21/17 1409 07/21/17 1810  Weight: 215 lb (97.5 kg) 216 lb 0.8 oz (98 kg)    Telemetry    SR - Personally Reviewed  ECG    SR, ST elevation in lead III again after resolution post cath yesterday; ST depression in V2-4 - Personally Reviewed  Physical Exam   GEN: No acute distress.   Neck: No JVD Cardiac: RRR, no murmurs, rubs, or gallops.  Respiratory: Clear to auscultation bilaterally. GI: Soft, nontender, non-distended  MS: No edema; No deformity. Right groin site with mild tenderness Neuro:  Nonfocal  Psych: Normal affect   Labs    Chemistry Recent Labs  Lab 07/21/17 1434  07/22/17 0657  NA 137 133*  K 3.5 3.6  CL 104 102  CO2 27 21*  GLUCOSE 114* 138*  BUN 11 9  CREATININE 1.36* 1.10  CALCIUM 9.0 8.9  GFRNONAA >60 >60  GFRAA >60 >60  ANIONGAP 6 10     Hematology Recent Labs  Lab 07/21/17 1434 07/22/17 0657  WBC 4.5 11.8*  RBC 4.58 4.56  HGB 13.0 13.1  HCT 40.7 39.9  MCV 88.9 87.5  MCH 28.4 28.7  MCHC 31.9 32.8  RDW 12.6 12.9  PLT 270 245    Cardiac Enzymes Recent Labs  Lab 07/21/17 1434 07/21/17 1916 07/22/17 0020 07/22/17 0657  TROPONINI <0.03 1.83* 7.59* 3.31*   No results for input(s): TROPIPOC in the last 168 hours.   BNPNo results for input(s): BNP, PROBNP in the last 168 hours.   DDimer No results for input(s): DDIMER in the last 168 hours.   Radiology    Dg Chest 2 View  Result Date: 07/21/2017 CLINICAL DATA:  Chest pain, shortness of breath. EXAM: CHEST  2 VIEW COMPARISON:  None. FINDINGS: The heart size and mediastinal contours are within normal limits. Both lungs are clear. No pneumothorax or pleural effusion is noted. The visualized skeletal structures are unremarkable. IMPRESSION: No active cardiopulmonary disease. Electronically Signed  By: Lupita RaiderJames  Green Jr, M.D.   On: 07/21/2017 14:32    Cardiac Studies   LHC 07/21/2017:  Mid RCA lesion is 100% stenosed.  Acute Mrg lesion is 50% stenosed.  A drug-eluting stent was successfully placed using a STENT SYNERGY DES 3X24.  Post intervention, there is a 0% residual stenosis.  Dist RCA lesion is 80% stenosed.  A drug-eluting stent was successfully placed using a STENT SYNERGY DES 2.75X24.  Post intervention, there is a 0% residual stenosis.  Prox Cx to Mid Cx lesion is 30% stenosed.  Ramus lesion is 90% stenosed.  Ost Ramus lesion is 30% stenosed.  Ost 1st Diag lesion is 50% stenosed.  Prox LAD to Mid LAD lesion is 20% stenosed.  The left ventricular systolic function is normal.  LV end diastolic pressure is normal.  The left ventricular  ejection fraction is greater than 65% by visual estimate.  There is no mitral valve regurgitation. 1. Acute inferior STEMI secondary to occluded, aneurysmal mid RCA. 2. Successful PTCA/DES x 2 mid RCA with evidence of residual thrombus in the small caliber distal branches.  3. Mild non-obstructive disease in the LAD. Moderate ostial stenosis in the moderate caliber first Diagonal branch.  4. Mild non-obstructive disease in the Circumflex artery.  5. Severe stenosis in the large caliber intermediate branch.  6. Normal LV systolic function  Patient Profile     46 y.o. male with PMH of HTN who [facility] with chest pain, diaphoresis and dyspnea and was found to have ST elevations in the inferior leads on repeat EKG; Code STEMI was called, [facility] and had 2 DES stents placed to his occluded, aneurysmal mid RCA.  Assessment & Plan    Inferior STEMI CAD: S/p mid RCA DES stent x2. Staged cath for possible intervention to intermediate branch. Troponin peaked at 7.6. Hgb stable; renal function stable. His recurrence of chest pain and EKG changes likely from residual RCA disease as he had slow flow post PCI. --aggrastat gtt for 18hr total due to amount of clot burden --f/u Echo --asa + Brillinta --atorvastatin 80mg  daily --morphine for pain --staged PCI tomorrow  HLD: Atorvastatin 80mg  daily.  HTN: Amlodipine 5mg  daily; PRN hydral for SBP>160.  For questions or updates, please contact [facility] Please consult [email or web address] for contact info under Cardiology/STEMI.   Signed, Nyra MarketGorica Svalina, MD  07/22/2017, 9:18 AM    I have examined the patient and reviewed assessment and plan and discussed with patient.  Agree with above as stated.    GEN: Well nourished, well developed, in no acute distress  HEENT: normal  Neck: no JVD, carotid bruits, or masses Cardiac: RRR; no murmurs, rubs, or gallops,no edema ; 2+  right radial pulse, no right groin hematoma Respiratory:  clear to auscultation bilaterally, normal work of breathing GI: soft, nontender, nondistended,  MS: no deformity or atrophy  Skin: warm and dry, no rash Neuro:  Strength and sensation are intact Psych: euthymic mood, full affect  Recurrent discomfort.  I personally reviewed the cath films.  He had evidence of no reflow.  He has been on 2 B3 inhibitor.  I suspect some of his discomfort is coming from microvascular disease.  Plan for cath tomorrow with PCI of the ramus and relook of the RCA.  Troponin was trending down.  Increase antihypertensives.  Morphine also renewed for discomfort.  I personally reviewed the ECG.  Minimal inferior ST changes noted.  Continue with medical therapy at this time.  Carvedilol added.   Lance MussJayadeep Varanasi

## 2017-07-22 NOTE — Progress Notes (Signed)
 Progress Note  Patient Name: Chad Chase Date of Encounter: 07/22/2017  Primary Cardiologist: [facility] ([place])  Subjective   Patient states he had recurrence of substernal chest pain around midnight last night with no other associated symptoms; this is similar pain to his STEMI yesterday prior to cath, but not as severe. He denies shortness of breath, palpitations, dizziness.  Inpatient Medications    Scheduled Meds: . amLODipine  5 mg Oral Daily  . aspirin  81 mg Oral Daily  . atorvastatin  80 mg Oral Daily  . atropine      . sodium chloride flush  3 mL Intravenous Q12H  . ticagrelor  90 mg Oral BID   Continuous Infusions: . sodium chloride    . tirofiban 0.15 mcg/kg/min (07/22/17 0546)   PRN Meds: sodium chloride, acetaminophen, hydrALAZINE, ondansetron (ZOFRAN) IV, oxyCODONE, sodium chloride flush   Vital Signs    Vitals:   07/22/17 0645 07/22/17 0700 07/22/17 0800 07/22/17 0900  BP: (!) 172/115 (!) 182/112 (!) 176/108 (!) 160/112  Pulse: 97 100 97 97  Resp: (!) 23 (!) 21 (!) 26 20  Temp:   98.8 F (37.1 C)   TempSrc:   Oral   SpO2: 99% 100% 100% 100%  Weight:      Height:        Intake/Output Summary (Last 24 hours) at 07/22/2017 0918 Last data filed at 07/22/2017 0800 Gross per 24 hour  Intake 1333.87 ml  Output 1550 ml  Net -216.13 ml   Filed Weights   07/21/17 1409 07/21/17 1810  Weight: 215 lb (97.5 kg) 216 lb 0.8 oz (98 kg)    Telemetry    SR - Personally Reviewed  ECG    SR, ST elevation in lead III again after resolution post cath yesterday; ST depression in V2-4 - Personally Reviewed  Physical Exam   GEN: No acute distress.   Neck: No JVD Cardiac: RRR, no murmurs, rubs, or gallops.  Respiratory: Clear to auscultation bilaterally. GI: Soft, nontender, non-distended  MS: No edema; No deformity. Right groin site with mild tenderness Neuro:  Nonfocal  Psych: Normal affect   Labs    Chemistry Recent Labs  Lab 07/21/17 1434  07/22/17 0657  NA 137 133*  K 3.5 3.6  CL 104 102  CO2 27 21*  GLUCOSE 114* 138*  BUN 11 9  CREATININE 1.36* 1.10  CALCIUM 9.0 8.9  GFRNONAA >60 >60  GFRAA >60 >60  ANIONGAP 6 10     Hematology Recent Labs  Lab 07/21/17 1434 07/22/17 0657  WBC 4.5 11.8*  RBC 4.58 4.56  HGB 13.0 13.1  HCT 40.7 39.9  MCV 88.9 87.5  MCH 28.4 28.7  MCHC 31.9 32.8  RDW 12.6 12.9  PLT 270 245    Cardiac Enzymes Recent Labs  Lab 07/21/17 1434 07/21/17 1916 07/22/17 0020 07/22/17 0657  TROPONINI <0.03 1.83* 7.59* 3.31*   No results for input(s): TROPIPOC in the last 168 hours.   BNPNo results for input(s): BNP, PROBNP in the last 168 hours.   DDimer No results for input(s): DDIMER in the last 168 hours.   Radiology    Dg Chest 2 View  Result Date: 07/21/2017 CLINICAL DATA:  Chest pain, shortness of breath. EXAM: CHEST  2 VIEW COMPARISON:  None. FINDINGS: The heart size and mediastinal contours are within normal limits. Both lungs are clear. No pneumothorax or pleural effusion is noted. The visualized skeletal structures are unremarkable. IMPRESSION: No active cardiopulmonary disease. Electronically Signed     By: James  Green Jr, M.D.   On: 07/21/2017 14:32    Cardiac Studies   LHC 07/21/2017:  Mid RCA lesion is 100% stenosed.  Acute Mrg lesion is 50% stenosed.  A drug-eluting stent was successfully placed using a STENT SYNERGY DES 3X24.  Post intervention, there is a 0% residual stenosis.  Dist RCA lesion is 80% stenosed.  A drug-eluting stent was successfully placed using a STENT SYNERGY DES 2.75X24.  Post intervention, there is a 0% residual stenosis.  Prox Cx to Mid Cx lesion is 30% stenosed.  Ramus lesion is 90% stenosed.  Ost Ramus lesion is 30% stenosed.  Ost 1st Diag lesion is 50% stenosed.  Prox LAD to Mid LAD lesion is 20% stenosed.  The left ventricular systolic function is normal.  LV end diastolic pressure is normal.  The left ventricular  ejection fraction is greater than 65% by visual estimate.  There is no mitral valve regurgitation. 1. Acute inferior STEMI secondary to occluded, aneurysmal mid RCA. 2. Successful PTCA/DES x 2 mid RCA with evidence of residual thrombus in the small caliber distal branches.  3. Mild non-obstructive disease in the LAD. Moderate ostial stenosis in the moderate caliber first Diagonal branch.  4. Mild non-obstructive disease in the Circumflex artery.  5. Severe stenosis in the large caliber intermediate branch.  6. Normal LV systolic function  Patient Profile     46 y.o. male with PMH of HTN who [facility] with chest pain, diaphoresis and dyspnea and was found to have ST elevations in the inferior leads on repeat EKG; Code STEMI was called, [facility] and taken for emergent cath and had 2 DES stents placed to his occluded, aneurysmal mid RCA.  Assessment & Plan    Inferior STEMI CAD: S/p mid RCA DES stent x2. Staged cath for possible intervention to intermediate branch. Troponin peaked at 7.6. Hgb stable; renal function stable. His recurrence of chest pain and EKG changes likely from residual RCA disease as he had slow flow post PCI. --aggrastat gtt for 18hr total due to amount of clot burden --f/u Echo --asa + Brillinta --atorvastatin 80mg daily --morphine for pain --staged PCI tomorrow  HLD: Atorvastatin 80mg daily.  HTN: Amlodipine 5mg daily; PRN hydral for SBP>160.  For questions or updates, please contact [facility] Please consult [email or web address] for contact info under Cardiology/STEMI.   Signed, Gorica Svalina, MD  07/22/2017, 9:18 AM    I have examined the patient and reviewed assessment and plan and discussed with patient.  Agree with above as stated.    GEN: Well nourished, well developed, in no acute distress  HEENT: normal  Neck: no JVD, carotid bruits, or masses Cardiac: RRR; no murmurs, rubs, or gallops,no edema ; 2+  right radial pulse, no right groin hematoma Respiratory:  clear to auscultation bilaterally, normal work of breathing GI: soft, nontender, nondistended,  MS: no deformity or atrophy  Skin: warm and dry, no rash Neuro:  Strength and sensation are intact Psych: euthymic mood, full affect  Recurrent discomfort.  I personally reviewed the cath films.  He had evidence of no reflow.  He has been on 2 B3 inhibitor.  I suspect some of his discomfort is coming from microvascular disease.  Plan for cath tomorrow with PCI of the ramus and relook of the RCA.  Troponin was trending down.  Increase antihypertensives.  Morphine also renewed for discomfort.  I personally reviewed the ECG.  Minimal inferior ST changes noted.    Continue with medical therapy at this time.  Carvedilol added.   Niklaus Mamaril  

## 2017-07-22 NOTE — Care Management Note (Addendum)
Case Management Note  Patient Details  Name: Fransisca ConnorsBarry L Flanagan MRN: [id number] Date of Birth: 1971/06/03  Subjective/Objective:  Pt admitted with STEMI                  Action/Plan:   PTA independent from home with wife.  Pt has PCP and denied barriers to obtaining/paying for medications.  Pt will discharge home on Brilinta - CM confirmed that pt does not have medicare nor medicaid.  CM provided free 30 day card and reduced monthly copay card - submitted benefit check to clarify if prior Berkley Harveyauth is required.  CM verified [place] [facility] [place]- CM verified pharmacy can fill script at discharge  Expected Discharge Date:                  Expected Discharge Plan:  Home/Self Care  In-House Referral:     Discharge planning Services  CM Consult, Medication Assistance  Post Acute Care Choice:    Choice offered to:     DME Arranged:    DME Agency:     HH Arranged:    HH Agency:     Status of Service:     If discussed at [employer] Meetings, dates discussed:    Additional Comments: Brilinta benefit check:  No prior auth required.  Pt informed that per benefit check copay will be $0.   Cherylann ParrClaxton, Nathyn Luiz S, RN 07/22/2017, 11:03 AM

## 2017-07-22 NOTE — Progress Notes (Signed)
CARDIAC REHAB PHASE I   Pt in bed, had episode of CP earlier, received morphine, pt for relook cath tomorrow, will hold ambulation at this time. Began MI/stent education with pt and family at bedside.  Reviewed risk factors, MI book, anti-platelet therapy, stent card, activity restrictions, ntg,and phase 2 cardiac rehab. Left heart healthy diet handout for pt to review. Pt and verbalized understanding. Pt agrees to phase 2 cardiac rehab referral, [facility]. Pt in bed, call bell within reach. Will follow.    [id number] Joylene GrapesEmily C Bertha Earwood, RN, BSN 07/22/2017 3:01 PM

## 2017-07-22 NOTE — Progress Notes (Signed)
CRITICAL VALUE ALERT  Critical Value:  Troponin 7.59  Date & Time Notied:  07/22/17 1:27 AM  Orders Received/Actions taken: Notified Dr. Vonzella NippleWosik, no new orders. Expected finding post-STEMI, no reports of chest pain/pressure/discomfort at this time.

## 2017-07-23 ENCOUNTER — Encounter (HOSPITAL_COMMUNITY): Payer: Self-pay | Admitting: Cardiovascular Disease

## 2017-07-23 ENCOUNTER — Telehealth: Payer: Self-pay | Admitting: Family Medicine

## 2017-07-23 ENCOUNTER — Encounter (HOSPITAL_COMMUNITY)
Admission: RE | Disposition: A | Discharge status: Home / Self Care | Payer: Self-pay | Source: Home / Self Care | Attending: Cardiovascular Disease

## 2017-07-23 DIAGNOSIS — E782 Mixed hyperlipidemia: Secondary | ICD-10-CM

## 2017-07-23 DIAGNOSIS — I25110 Atherosclerotic heart disease of native coronary artery with unstable angina pectoris: Secondary | ICD-10-CM

## 2017-07-23 DIAGNOSIS — I10 Essential (primary) hypertension: Secondary | ICD-10-CM

## 2017-07-23 HISTORY — PX: CORONARY STENT INTERVENTION: CATH118234

## 2017-07-23 HISTORY — PX: LEFT HEART CATH AND CORONARY ANGIOGRAPHY: CATH118249

## 2017-07-23 LAB — BASIC METABOLIC PANEL
Anion gap: 9 (ref 5–15)
BUN: 7 mg/dL (ref 6–20)
CALCIUM: 9.1 mg/dL (ref 8.9–10.3)
CO2: 23 mmol/L (ref 22–32)
CREATININE: 1.1 mg/dL (ref 0.61–1.24)
Chloride: 99 mmol/L — ABNORMAL LOW (ref 101–111)
Glucose, Bld: 131 mg/dL — ABNORMAL HIGH (ref 65–99)
Potassium: 3.9 mmol/L (ref 3.5–5.1)
SODIUM: 131 mmol/L — AB (ref 135–145)

## 2017-07-23 LAB — POCT ACTIVATED CLOTTING TIME
ACTIVATED CLOTTING TIME: 296 s
Activated Clotting Time: 390 seconds
Activated Clotting Time: 566 seconds

## 2017-07-23 LAB — CBC
HCT: 38.9 % — ABNORMAL LOW (ref 39.0–52.0)
HEMOGLOBIN: 12.6 g/dL — AB (ref 13.0–17.0)
MCH: 28.4 pg (ref 26.0–34.0)
MCHC: 32.4 g/dL (ref 30.0–36.0)
MCV: 87.6 fL (ref 78.0–100.0)
PLATELETS: 255 10*3/uL (ref 150–400)
RBC: 4.44 MIL/uL (ref 4.22–5.81)
RDW: 12.9 % (ref 11.5–15.5)
WBC: 12.6 10*3/uL — ABNORMAL HIGH (ref 4.0–10.5)

## 2017-07-23 LAB — APTT: APTT: 30 s (ref 24–36)

## 2017-07-23 SURGERY — CORONARY STENT INTERVENTION
Anesthesia: LOCAL

## 2017-07-23 MED ORDER — SODIUM CHLORIDE 0.9 % IV SOLN
INTRAVENOUS | Status: AC
  Administered 2017-07-23: 11:00:00 via INTRAVENOUS

## 2017-07-23 MED ORDER — LABETALOL HCL 5 MG/ML IV SOLN: 10.0000 mg | INTRAVENOUS | Status: AC | PRN

## 2017-07-23 MED ORDER — HEPARIN SODIUM (PORCINE) 1000 UNIT/ML IJ SOLN
INTRAMUSCULAR | Status: AC
  Filled 2017-07-23: qty 1

## 2017-07-23 MED ORDER — FENTANYL CITRATE (PF) 100 MCG/2ML IJ SOLN
INTRAMUSCULAR | Status: AC
  Filled 2017-07-23: qty 2

## 2017-07-23 MED ORDER — SODIUM CHLORIDE 0.9 % WEIGHT BASED INFUSION: 1.0000 mL/kg/h | INTRAVENOUS | Status: DC

## 2017-07-23 MED ORDER — SODIUM CHLORIDE 0.9 % IV SOLN: 250.0000 mL | INTRAVENOUS | Status: DC | PRN

## 2017-07-23 MED ORDER — CARVEDILOL 6.25 MG PO TABS
6.2500 mg | ORAL_TABLET | Freq: Once | ORAL | Status: AC
  Administered 2017-07-23: 6.25 mg via ORAL
  Filled 2017-07-23: qty 1

## 2017-07-23 MED ORDER — HYDRALAZINE HCL 20 MG/ML IJ SOLN: 5.0000 mg | INTRAMUSCULAR | Status: AC | PRN

## 2017-07-23 MED ORDER — MIDAZOLAM HCL 2 MG/2ML IJ SOLN
INTRAMUSCULAR | Status: AC
  Filled 2017-07-23: qty 2

## 2017-07-23 MED ORDER — IOPAMIDOL (ISOVUE-370) INJECTION 76%
INTRAVENOUS | Status: DC | PRN
  Administered 2017-07-23: 135 mL via INTRA_ARTERIAL

## 2017-07-23 MED ORDER — IOPAMIDOL (ISOVUE-370) INJECTION 76%
INTRAVENOUS | Status: AC
  Filled 2017-07-23: qty 50

## 2017-07-23 MED ORDER — NITROGLYCERIN 1 MG/10 ML FOR IR/CATH LAB
INTRA_ARTERIAL | Status: AC
  Filled 2017-07-23: qty 10

## 2017-07-23 MED ORDER — SODIUM CHLORIDE 0.9% FLUSH: 3.0000 mL | INTRAVENOUS | Status: DC | PRN

## 2017-07-23 MED ORDER — HEPARIN SODIUM (PORCINE) 1000 UNIT/ML IJ SOLN
INTRAMUSCULAR | Status: DC | PRN
Start: 2017-07-23 — End: 2017-07-23
  Administered 2017-07-23: 10000 [IU] via INTRAVENOUS

## 2017-07-23 MED ORDER — VERAPAMIL HCL 2.5 MG/ML IV SOLN
INTRAVENOUS | Status: DC | PRN
  Administered 2017-07-23: 10 mL via INTRA_ARTERIAL

## 2017-07-23 MED ORDER — SODIUM CHLORIDE 0.9 % IV SOLN
250.0000 mL | INTRAVENOUS | Status: DC | PRN
Start: 2017-07-23 — End: 2017-07-23

## 2017-07-23 MED ORDER — SODIUM CHLORIDE 0.9% FLUSH
3.0000 mL | Freq: Two times a day (BID) | INTRAVENOUS | Status: DC
  Administered 2017-07-23 – 2017-07-24 (×2): 3 mL via INTRAVENOUS

## 2017-07-23 MED ORDER — LIDOCAINE HCL (PF) 1 % IJ SOLN
INTRAMUSCULAR | Status: AC
Start: 2017-07-23 — End: 2017-07-23
  Filled 2017-07-23: qty 30

## 2017-07-23 MED ORDER — SODIUM CHLORIDE 0.9% FLUSH
3.0000 mL | Freq: Two times a day (BID) | INTRAVENOUS | Status: DC
  Administered 2017-07-23: 3 mL via INTRAVENOUS

## 2017-07-23 MED ORDER — ACETAMINOPHEN 325 MG PO TABS
650.0000 mg | ORAL_TABLET | Freq: Four times a day (QID) | ORAL | Status: DC | PRN
  Administered 2017-07-23 – 2017-07-24 (×2): 650 mg via ORAL
  Filled 2017-07-23 (×2): qty 2

## 2017-07-23 MED ORDER — SODIUM CHLORIDE 0.9 % WEIGHT BASED INFUSION: 3.0000 mL/kg/h | INTRAVENOUS | Status: DC

## 2017-07-23 MED ORDER — LIDOCAINE HCL (PF) 1 % IJ SOLN
INTRAMUSCULAR | Status: DC | PRN
  Administered 2017-07-23: 2 mL via INTRADERMAL

## 2017-07-23 MED ORDER — MIDAZOLAM HCL 2 MG/2ML IJ SOLN
INTRAMUSCULAR | Status: DC | PRN
  Administered 2017-07-23: 2 mg via INTRAVENOUS

## 2017-07-23 MED ORDER — VERAPAMIL HCL 2.5 MG/ML IV SOLN
INTRAVENOUS | Status: AC
Start: 2017-07-23 — End: 2017-07-23
  Filled 2017-07-23: qty 2

## 2017-07-23 MED ORDER — SODIUM CHLORIDE 0.9 % WEIGHT BASED INFUSION
3.0000 mL/kg/h | INTRAVENOUS | Status: DC
Start: 2017-07-23 — End: 2017-07-23
  Administered 2017-07-23: 3 mL/kg/h via INTRAVENOUS

## 2017-07-23 MED ORDER — CARVEDILOL 12.5 MG PO TABS
12.5000 mg | ORAL_TABLET | Freq: Two times a day (BID) | ORAL | Status: DC
  Administered 2017-07-23 – 2017-07-24 (×2): 12.5 mg via ORAL
  Filled 2017-07-23 (×2): qty 1

## 2017-07-23 MED ORDER — HEPARIN (PORCINE) IN NACL 2-0.9 UNIT/ML-% IJ SOLN
INTRAMUSCULAR | Status: AC
Start: 2017-07-23 — End: 2017-07-23
  Filled 2017-07-23: qty 1000

## 2017-07-23 MED ORDER — SODIUM CHLORIDE 0.9 % WEIGHT BASED INFUSION
1.0000 mL/kg/h | INTRAVENOUS | Status: DC
Start: 2017-07-23 — End: 2017-07-23

## 2017-07-23 MED ORDER — HEPARIN (PORCINE) IN NACL 2-0.9 UNIT/ML-% IJ SOLN
INTRAMUSCULAR | Status: AC | PRN
  Administered 2017-07-23: 1000 mL via INTRA_ARTERIAL

## 2017-07-23 MED ORDER — FENTANYL CITRATE (PF) 100 MCG/2ML IJ SOLN
INTRAMUSCULAR | Status: DC | PRN
Start: 2017-07-23 — End: 2017-07-23
  Administered 2017-07-23: 50 ug via INTRAVENOUS

## 2017-07-23 MED ORDER — IOPAMIDOL (ISOVUE-370) INJECTION 76%
INTRAVENOUS | Status: AC
  Filled 2017-07-23: qty 125

## 2017-07-23 MED ORDER — ATORVASTATIN CALCIUM 80 MG PO TABS
80.0000 mg | ORAL_TABLET | Freq: Every day | ORAL | Status: DC
  Administered 2017-07-23: 80 mg via ORAL
  Filled 2017-07-23: qty 1

## 2017-07-23 MED ORDER — SODIUM CHLORIDE 0.9% FLUSH
3.0000 mL | INTRAVENOUS | Status: DC | PRN
Start: 2017-07-23 — End: 2017-07-23

## 2017-07-23 SURGICAL SUPPLY — 19 items
BALLN SAPPHIRE 2.5X12 (BALLOONS) ×2
BALLN SAPPHIRE ~~LOC~~ 2.75X12 (BALLOONS) ×1 IMPLANT
BALLOON SAPPHIRE 2.5X12 (BALLOONS) IMPLANT
CATH INFINITI JR4 5F (CATHETERS) ×1 IMPLANT
CATH LAUNCHER 6FR AL1 (CATHETERS) IMPLANT
CATH VISTA GUIDE 6FR XBLAD3.5 (CATHETERS) ×1 IMPLANT
CATHETER LAUNCHER 6FR AL1 (CATHETERS) ×2
DEVICE RAD COMP TR BAND LRG (VASCULAR PRODUCTS) ×1 IMPLANT
GLIDESHEATH SLEND SS 6F .021 (SHEATH) ×1 IMPLANT
GUIDEWIRE INQWIRE 1.5J.035X260 (WIRE) IMPLANT
INQWIRE 1.5J .035X260CM (WIRE) ×2
KIT ENCORE 26 ADVANTAGE (KITS) ×1 IMPLANT
KIT HEART LEFT (KITS) ×2 IMPLANT
PACK CARDIAC CATHETERIZATION (CUSTOM PROCEDURE TRAY) ×2 IMPLANT
STENT SIERRA 2.75 X 18 MM (Permanent Stent) ×1 IMPLANT
TRANSDUCER W/STOPCOCK (MISCELLANEOUS) ×2 IMPLANT
TUBING CIL FLEX 10 FLL-RA (TUBING) ×2 IMPLANT
WIRE ASAHI PROWATER 180CM (WIRE) ×1 IMPLANT
WIRE HI TORQ WHISPER MS 190CM (WIRE) ×1 IMPLANT

## 2017-07-23 NOTE — Progress Notes (Signed)
Stent card from placement of stent to Ramus today given to pt/pt's significant other (Rikki). Pt has stent card and booklet from placement of stents on Sunday 07/21/17.

## 2017-07-23 NOTE — Interval H&P Note (Signed)
History and Physical Interval Note:  07/23/2017 8:30 AM  Chad Chase  has presented today for cardiac cath with the diagnosis of CAD, staged PCI of intermediate, relook of the RCA stented segment. The various methods of treatment have been discussed with the patient and family. After consideration of risks, benefits and other options for treatment, the patient has consented to  Procedure(s): CORONARY STENT INTERVENTION (N/A) as a surgical intervention .  The patient's history has been reviewed, patient examined, no change in status, stable for surgery.  I have reviewed the patient's chart and labs.  Questions were answered to the patient's satisfaction.    Cath Lab Visit (complete for each Cath Lab visit)  Clinical Evaluation Leading to the Procedure:   ACS: Yes.    Non-ACS:    Anginal Classification: CCS III  Anti-ischemic medical therapy: Maximal Therapy (2 or more classes of medications)  Non-Invasive Test Results: No non-invasive testing performed  Prior CABG: No previous CABG         Verne Carrowhristopher Brendon Christoffel

## 2017-07-23 NOTE — Progress Notes (Signed)
Temp up to 101.3,  precath 99.2, in cath lab 100.6, cath recovery 100.0, post cath in room 99.0, now up to 101.3.   Laverda PageLindsay Roberts, NP notified, current orders for tylenol for HA and mild pain, also informed NP of pt DBP remaining around 100, pt was given a.m. norvasc post cath.  Has hydralazine for SBP > 160.  She will review pt records and place orders.

## 2017-07-23 NOTE — Telephone Encounter (Signed)
Spoke to wife- she stated she [facility]  and he was taken straight to the hospital for surgery. I let her know that the doctors should be able to view his BP in Epic- they should have access. Expressed we hope that things go well and we will she them after he is released.

## 2017-07-23 NOTE — Progress Notes (Signed)
Progress Note  Patient Name: Chad ConnorsBarry L Chase Date of Encounter: 07/23/2017  Primary Cardiologist: Ellis ParentsNew Clifton James([place])  Subjective   Patient states he has some mild chest pain only when breathing in, no shortness of breath or tightness, no palpitations.   We went over cath findings and recommendations again with patient and family.  Inpatient Medications    Scheduled Meds: . amLODipine  5 mg Oral Daily  . aspirin  81 mg Oral Daily  . atorvastatin  80 mg Oral Daily  . carvedilol  6.25 mg Oral BID WC  . sodium chloride flush  3 mL Intravenous Q12H  . ticagrelor  90 mg Oral BID   Continuous Infusions: . sodium chloride    . sodium chloride    . sodium chloride     PRN Meds: sodium chloride, sodium chloride, acetaminophen, hydrALAZINE, morphine injection, ondansetron (ZOFRAN) IV, oxyCODONE, sodium chloride flush   Vital Signs    Vitals:   07/23/17 0200 07/23/17 0300 07/23/17 0400 07/23/17 0500  BP: (!) 144/87 (!) 133/92 (!) 136/94 135/89  Pulse: 96 98 96 99  Resp: (!) 30 (!) 26 (!) 28 (!) 30  Temp:      TempSrc:      SpO2: 97% 97% 96% 97%  Weight:      Height:        Intake/Output Summary (Last 24 hours) at 07/23/2017 0736 Last data filed at 07/23/2017 0224 Gross per 24 hour  Intake 249.2 ml  Output 1450 ml  Net -1200.8 ml   Filed Weights   07/21/17 1409 07/21/17 1810  Weight: 215 lb (97.5 kg) 216 lb 0.8 oz (98 kg)    Telemetry    SR - Personally Reviewed  ECG    N/a today  Physical Exam   GEN: No acute distress.   Neck: No JVD Cardiac: RRR, no murmurs, rubs, or gallops. L radial cath site looks clean without a hematoma; Radial pulse is intact. PT pulses intact bilaterally Respiratory: Clear to auscultation bilaterally on anterior lung fields. GI: Soft, nontender, non-distended  MS: No edema; no deformity.  Neuro:  Nonfocal  Psych: Normal affect   Labs    Chemistry Recent Labs  Lab 07/21/17 1434 07/22/17 0657 07/23/17 0204  NA 137 133* 131*    K 3.5 3.6 3.9  CL 104 102 99*  CO2 27 21* 23  GLUCOSE 114* 138* 131*  BUN 11 9 7   CREATININE 1.36* 1.10 1.10  CALCIUM 9.0 8.9 9.1  GFRNONAA >60 >60 >60  GFRAA >60 >60 >60  ANIONGAP 6 10 9      Hematology Recent Labs  Lab 07/21/17 1434 07/22/17 0657 07/23/17 0204  WBC 4.5 11.8* 12.6*  RBC 4.58 4.56 4.44  HGB 13.0 13.1 12.6*  HCT 40.7 39.9 38.9*  MCV 88.9 87.5 87.6  MCH 28.4 28.7 28.4  MCHC 31.9 32.8 32.4  RDW 12.6 12.9 12.9  PLT 270 245 255    Cardiac Enzymes Recent Labs  Lab 07/21/17 1434 07/21/17 1916 07/22/17 0020 07/22/17 0657  TROPONINI <0.03 1.83* 7.59* 3.31*   No results for input(s): TROPIPOC in the last 168 hours.   BNPNo results for input(s): BNP, PROBNP in the last 168 hours.   DDimer No results for input(s): DDIMER in the last 168 hours.   Radiology    Dg Chest 2 View  Result Date: 07/21/2017 CLINICAL DATA:  Chest pain, shortness of breath. EXAM: CHEST  2 VIEW COMPARISON:  None. FINDINGS: The heart size and mediastinal contours are within normal  limits. Both lungs are clear. No pneumothorax or pleural effusion is noted. The visualized skeletal structures are unremarkable. IMPRESSION: No active cardiopulmonary disease. Electronically Signed   By: Lupita Raider, M.D.   On: 07/21/2017 14:32    Cardiac Studies   LHC 07/21/2017:  Mid RCA lesion is 100% stenosed.  Acute Mrg lesion is 50% stenosed.  A drug-eluting stent was successfully placed using a STENT SYNERGY DES 3X24.  Post intervention, there is a 0% residual stenosis.  Dist RCA lesion is 80% stenosed.  A drug-eluting stent was successfully placed using a STENT SYNERGY DES 2.75X24.  Post intervention, there is a 0% residual stenosis.  Prox Cx to Mid Cx lesion is 30% stenosed.  Ramus lesion is 90% stenosed.  Ost Ramus lesion is 30% stenosed.  Ost 1st Diag lesion is 50% stenosed.  Prox LAD to Mid LAD lesion is 20% stenosed.  The left ventricular systolic function is  normal.  LV end diastolic pressure is normal.  The left ventricular ejection fraction is greater than 65% by visual estimate.  There is no mitral valve regurgitation. 1. Acute inferior STEMI secondary to occluded, aneurysmal mid RCA. 2. Successful PTCA/DES x 2 mid RCA with evidence of residual thrombus in the small caliber distal branches.  3. Mild non-obstructive disease in the LAD. Moderate ostial stenosis in the moderate caliber first Diagonal branch.  4. Mild non-obstructive disease in the Circumflex artery.  5. Severe stenosis in the large caliber intermediate branch.  6. Normal LV systolic function  Echo 07/22/2017: - Left ventricle: The cavity size was normal. There was moderate   concentric hypertrophy. Systolic function was normal. The   estimated ejection fraction was in the range of 60% to 65%. Wall   motion was normal; there were no regional wall motion   abnormalities. Doppler parameters are consistent with abnormal   left ventricular relaxation (grade 1 diastolic dysfunction). - Aortic valve: Transvalvular velocity was within the normal range.   There was no stenosis. There was mild regurgitation. Valve area   (Vmax): 1.94 cm^2. - Mitral valve: Transvalvular velocity was within the normal range.   There was no evidence for stenosis. There was no regurgitation.   Valve area by pressure half-time: 1.71 cm^2. - Right ventricle: The cavity size was normal. Wall thickness was   normal. Systolic function was normal. - Atrial septum: No defect or patent foramen ovale was identified. - Tricuspid valve: There was no regurgitation. - Pericardium, extracardiac: There was a left pleural effusion.  LHC 07/23/17:  Mid RCA to Dist RCA lesion is 100% stenosed.  Ramus lesion is 90% stenosed.  Prox Cx to Mid Cx lesion is 30% stenosed.  Ost 1st Diag lesion is 50% stenosed.  A drug-eluting stent was successfully placed using a STENT SIERRA 2.75 X 18 MM.  Post intervention,  there is a 0% residual stenosis. 1. Severe stenosis ramus intermediate branch. Successful PTCA/DES x 1 intermediate branch 2. Occlusion of the mid RCA stents. Unable to open the occluded RCA stented segment.  --Recommendations: Continue DAPT with ASA and Brilinta for one year. Continue statin/beta blocker.   Patient Profile     46 y.o. male with PMH of HTN who presented to [facility] 11/25 with chest pain, diaphoresis and dyspnea and was found to have ST elevations in the inferior leads on repeat EKG; Code STEMI was called, patient transferred to [facility] and taken for emergent cath and had 2 DES stents placed to his occluded, aneurysmal mid RCA  which restenosed on repeat cath; he also had a DES stent placed in intermediate branch on 11/27.  Assessment & Plan    Inferior STEMI CAD: S/p mid RCA DES stent x2 - restenosed. S/p intermediate branch DES stent. Echo showed EEF 60-65%, G1DD. Renal function is stable (Cr 1.1), Hgb with mild drop to 12.6. --asa + Brillinta for 1 year --atorvastatin 80mg  daily --carvedilol 6.25mg  BID - titrate up tomorrow if able --morphine for pain --staged PCI today  HLD: Atorvastatin 80mg  daily.  HTN: Amlodipine 5mg  daily, carvedilol 6.25mg  BID (can work on titrating up); PRN hydral for SBP>160.  For questions or updates, please contact [facility] Please consult [email or web address] for contact info under Cardiology/STEMI.   Signed, Nyra MarketGorica Svalina, MD  07/23/2017, 7:36 AM    I have examined the patient and reviewed assessment and plan and discussed with patient.  Agree with above as stated.  RCA stents occluded, likely related to no reflow phenomenon cuasing slower flow in the mid RCA.  Successful PCI today.  Possible discharge in AM.  Increase coreg to 12.5 mg BID for better BP control. Left radial site stable.   Lance MussJayadeep Florence Antonelli

## 2017-07-23 NOTE — Telephone Encounter (Signed)
FYI

## 2017-07-23 NOTE — Telephone Encounter (Signed)
Copied from CRM #[id number]. Topic: General - Other >> Jul 23, 2017  1:00 PM Oneal GroutSebastian, Jennifer S wrote: Reason for CRM: Patient is in the hospital, wife is requesting to know BP readings are normally in office. Please advise

## 2017-07-23 NOTE — Telephone Encounter (Signed)
He was seen in [place] for bronchitis by edward but had not been seen in over a year for his bp

## 2017-07-24 ENCOUNTER — Other Ambulatory Visit: Payer: Self-pay

## 2017-07-24 ENCOUNTER — Telehealth: Payer: Self-pay | Admitting: Cardiovascular Disease

## 2017-07-24 DIAGNOSIS — I10 Essential (primary) hypertension: Secondary | ICD-10-CM

## 2017-07-24 DIAGNOSIS — E785 Hyperlipidemia, unspecified: Secondary | ICD-10-CM

## 2017-07-24 LAB — CBC
HEMATOCRIT: 38 % — AB (ref 39.0–52.0)
HEMOGLOBIN: 12.6 g/dL — AB (ref 13.0–17.0)
MCH: 28.9 pg (ref 26.0–34.0)
MCHC: 33.2 g/dL (ref 30.0–36.0)
MCV: 87.2 fL (ref 78.0–100.0)
Platelets: 254 10*3/uL (ref 150–400)
RBC: 4.36 MIL/uL (ref 4.22–5.81)
RDW: 12.9 % (ref 11.5–15.5)
WBC: 14.6 10*3/uL — ABNORMAL HIGH (ref 4.0–10.5)

## 2017-07-24 LAB — BASIC METABOLIC PANEL
ANION GAP: 10 (ref 5–15)
BUN: 11 mg/dL (ref 6–20)
CHLORIDE: 99 mmol/L — AB (ref 101–111)
CO2: 23 mmol/L (ref 22–32)
Calcium: 9 mg/dL (ref 8.9–10.3)
Creatinine, Ser: 1.19 mg/dL (ref 0.61–1.24)
GFR calc Af Amer: >60 mL/min (ref >60)
GLUCOSE: 112 mg/dL — AB (ref 65–99)
POTASSIUM: 3.8 mmol/L (ref 3.5–5.1)
Sodium: 132 mmol/L — ABNORMAL LOW (ref 135–145)

## 2017-07-24 MED ORDER — CARVEDILOL 12.5 MG PO TABS
12.5000 mg | ORAL_TABLET | Freq: Once | ORAL | Status: AC
  Administered 2017-07-24: 12.5 mg via ORAL
  Filled 2017-07-24: qty 1

## 2017-07-24 MED ORDER — CARVEDILOL 25 MG PO TABS: 25.0000 mg | ORAL_TABLET | Freq: Two times a day (BID) | ORAL | Status: DC

## 2017-07-24 MED ORDER — ATORVASTATIN CALCIUM 80 MG PO TABS: 80.0000 mg | ORAL_TABLET | Freq: Every day | ORAL | 1 refills | Status: DC

## 2017-07-24 MED ORDER — CARVEDILOL 25 MG PO TABS: 25.0000 mg | ORAL_TABLET | Freq: Two times a day (BID) | ORAL | 2 refills | Status: DC

## 2017-07-24 MED ORDER — NITROGLYCERIN 0.4 MG SL SUBL: 0.4000 mg | SUBLINGUAL_TABLET | SUBLINGUAL | 2 refills | Status: DC | PRN

## 2017-07-24 MED ORDER — TICAGRELOR 90 MG PO TABS: 90.0000 mg | ORAL_TABLET | Freq: Two times a day (BID) | ORAL | 2 refills | Status: DC

## 2017-07-24 MED ORDER — ASPIRIN 81 MG PO CHEW: 81.0000 mg | CHEWABLE_TABLET | Freq: Every day | ORAL | Status: AC

## 2017-07-24 NOTE — Plan of Care (Signed)
Patient is ready for discharge. Medications ready. Cardiac rehab has been set up.

## 2017-07-24 NOTE — Progress Notes (Signed)
CARDIAC REHAB PHASE I   PRE:  Rate/Rhythm: 92 RS  BP:  Sitting: 114/89        SaO2: 98 RA  MODE:  Ambulation: 740 ft   POST:  Rate/Rhythm: 98 SR  BP:  Sitting: 117/88         SaO2: 98 RA  Pt ambulated 740 ft on RA, independent, steady gait, tolerated well with no complaints. Second walk today. Reinforced education, reviewed exercise guidelines, heart healthy diet. Pt and wife verbalized understanding. [facility] Pt to edge of bed per pt request after walk, call bell within reach, hopeful for discharge today.   [phone] Chad GrapesEmily C Nekhi Liwanag, RN, BSN 07/24/2017 11:52 AM

## 2017-07-24 NOTE — Telephone Encounter (Signed)
This medication is managed/refilled by patients pcp. Patient has never been seen here.  I spoke with patient and made him aware of this. He verbalized his understanding and stated that he will call his pcp to request this.

## 2017-07-24 NOTE — Discharge Summary (Addendum)
Discharge Summary    Patient ID: Chad Chase,  MRN: [id number], DOB/AGE: 08/31/1970 46 y.o.  Admit date: 07/21/2017 Discharge date: 07/24/2017  Primary Care Provider: Seabron Spates R Primary Cardiologist: [facility]   Discharge Diagnoses    Active Problems:   Acute ST elevation myocardial infarction (STEMI) of inferior wall [facility])   Coronary artery disease involving native coronary artery of native heart with unstable angina pectoris (HCC)   Hypertension   Hyperlipidemia   Allergies No Known Allergies  Diagnostic Studies/Procedures    Cath: 07/21/17  Conclusion     Mid RCA lesion is 100% stenosed.  Acute Mrg lesion is 50% stenosed.  A drug-eluting stent was successfully placed using a STENT SYNERGY DES 3X24.  Post intervention, there is a 0% residual stenosis.  Dist RCA lesion is 80% stenosed.  A drug-eluting stent was successfully placed using a STENT SYNERGY DES 2.75X24.  Post intervention, there is a 0% residual stenosis.  Prox Cx to Mid Cx lesion is 30% stenosed.  Ramus lesion is 90% stenosed.  Ost Ramus lesion is 30% stenosed.  Ost 1st Diag lesion is 50% stenosed.  Prox LAD to Mid LAD lesion is 20% stenosed.  The left ventricular systolic function is normal.  LV end diastolic pressure is normal.  The left ventricular ejection fraction is greater than 65% by visual estimate.  There is no mitral valve regurgitation.   1. Acute inferior STEMI secondary to occluded, aneurysmal mid RCA. 2. Successful PTCA/DES x 2 mid RCA with evidence of residual thrombus in the small caliber distal branches.  3. Mild non-obstructive disease in the LAD. Moderate ostial stenosis in the moderate caliber first Diagonal branch.  4. Mild non-obstructive disease in the Circumflex artery.  5. Severe stenosis in the large caliber intermediate branch.  6. Normal LV systolic function  Recommendations: Will admit to ICU and continue Aggrastat drip for 18 hours  given the large thrombus burden. Continue ASA/Brilinta/high intensity statin. No beta blocker given bradycardia. Echo in am. Will plan re-look cath late tomorrow afternoon or on Tuesday and at that time will likely perform PCI on the intermediate branch and assess need for any further intervention on the RCA.     Cath: 07/23/17  Conclusion     Mid RCA to Dist RCA lesion is 100% stenosed.  Ramus lesion is 90% stenosed.  Prox Cx to Mid Cx lesion is 30% stenosed.  Ost 1st Diag lesion is 50% stenosed.  A drug-eluting stent was successfully placed using a STENT SIERRA 2.75 X 18 MM.  Post intervention, there is a 0% residual stenosis.   1. Severe stenosis ramus intermediate branch. Successful PTCA/DES x 1 intermediate branch 2. Occlusion of the mid RCA stents. Unable to open the occluded RCA stented segment.   Recommendations: Continue DAPT with ASA and Brilinta for one year. Continue statin/beta blocker.    TTE: 07/22/17  Study Conclusions  - Left ventricle: The cavity size was normal. There was moderate   concentric hypertrophy. Systolic function was normal. The   estimated ejection fraction was in the range of 60% to 65%. Wall   motion was normal; there were no regional wall motion   abnormalities. Doppler parameters are consistent with abnormal   left ventricular relaxation (grade 1 diastolic dysfunction). - Aortic valve: Transvalvular velocity was within the normal range.   There was no stenosis. There was mild regurgitation. Valve area   (Vmax): 1.94 cm^2. - Mitral valve: Transvalvular velocity was within the normal range.  There was no evidence for stenosis. There was no regurgitation.   Valve area by pressure half-time: 1.71 cm^2. - Right ventricle: The cavity size was normal. Wall thickness was   normal. Systolic function was normal. - Atrial septum: No defect or patent foramen ovale was identified. - Tricuspid valve: There was no regurgitation. - Pericardium,  extracardiac: There was a left pleural effusion. _____________   History of Present Illness     [name] is 46 yo male with a history of HTN who presented to the [facility] with c/o chest pain, diaphoresis and dyspnea. This started 1 hour prior to arrival in the ED. Initial EKG did not show an injury current. Subsequent EKG showed 1 mm ST elevation in the inferior leads. Code STEMI was activated. He was given NTG, heparin, ASA and morphine in the ED. [facility] he was still having mild chest pain. He stopped taking his Norvasc several months ago after losing weight.   Hospital Course     Underwent cardiac cath noted above with [facility] residual thrombus in the small caliber branches. Also noted severe stenosis in a large caliber intermediate branch. He was continued on aggrastat for 18 hours post cath given thrombus burden. Troponin peaked at 7.59. Plan for DAPT with ASA/Brilinta. Follow up echo showed normal EF with no WMA. Underwent re-look cath with PCI/DES x1 intermediate branch and found to have occluded mRCA stents previously placed. Unable to reopen these stents. LDL 172 and was started on high dose statin. Started on coreg that was titrated up to 25mg  BID. Worked well with cardiac rehab without any further reports of chest pain. Did have a low grade fever, but s/s of infection which was felt to be related to MI.    Chad Chase was seen by Dr. Eldridge DaceVaranasi and determined stable for discharge home. Follow up in the office has been arranged. Medications are listed below.   _____________  Discharge Vitals Blood pressure 122/88, pulse 86, temperature 98.4 F (36.9 C), temperature source Oral, resp. rate (!) 25, height 5\' 11"  (1.803 m), weight 216 lb 0.8 oz (98 kg), SpO2 92 %.  Filed Weights   07/21/17 1409 07/21/17 1810  Weight: 215 lb (97.5 kg) 216 lb 0.8 oz (98 kg)    Labs & Radiologic Studies    CBC Recent Labs    07/23/17 0204  07/24/17 0337  WBC 12.6* 14.6*  HGB 12.6* 12.6*  HCT 38.9* 38.0*  MCV 87.6 87.2  PLT 255 254   Basic Metabolic Panel Recent Labs    40/98/1111/27/18 0204 07/24/17 0337  NA 131* 132*  K 3.9 3.8  CL 99* 99*  CO2 23 23  GLUCOSE 131* 112*  BUN 7 11  CREATININE 1.10 1.19  CALCIUM 9.1 9.0   Liver Function Tests No results for input(s): AST, ALT, ALKPHOS, BILITOT, PROT, ALBUMIN in the last 72 hours. No results for input(s): LIPASE, AMYLASE in the last 72 hours. Cardiac Enzymes Recent Labs    07/21/17 1916 07/22/17 0020 07/22/17 0657  TROPONINI 1.83* 7.59* 3.31*   BNP Invalid input(s): POCBNP D-Dimer No results for input(s): DDIMER in the last 72 hours. Hemoglobin A1C No results for input(s): HGBA1C in the last 72 hours. Fasting Lipid Panel Recent Labs    07/21/17 1434  CHOL 265*  HDL 39*  LDLCALC 172*  TRIG 270*  CHOLHDL 6.8   Thyroid Function Tests No results for input(s): TSH, T4TOTAL, T3FREE, THYROIDAB in the  last 72 hours.  Invalid input(s): FREET3 _____________  Dg Chest 2 View  Result Date: 07/21/2017 CLINICAL DATA:  Chest pain, shortness of breath. EXAM: CHEST  2 VIEW COMPARISON:  None. FINDINGS: The heart size and mediastinal contours are within normal limits. Both lungs are clear. No pneumothorax or pleural effusion is noted. The visualized skeletal structures are unremarkable. IMPRESSION: No active cardiopulmonary disease. Electronically Signed   By: Lupita RaiderJames  Green Jr, M.D.   On: 07/21/2017 14:32   Disposition   Pt is being discharged home today in good condition.  Follow-up Plans & Appointments    Follow-up Information    Dyann KiefLenze, Michele M, PA-C Follow up on 08/05/2017.   Specialty:  Cardiology Why:  at 8am for your follow up appt.  Contact information: [address] [place] [address] [phone]          Discharge Instructions    Amb Referral to Cardiac Rehabilitation   Complete by:  As directed    Diagnosis:   Coronary  Stents STEMI     Call MD for:  redness, tenderness, or signs of infection (pain, swelling, redness, odor or green/yellow discharge around incision site)   Complete by:  As directed    Diet - low sodium heart healthy   Complete by:  As directed    Discharge instructions   Complete by:  As directed    Radial Site Care Refer to this sheet in the next few weeks. These instructions provide you with information on caring for yourself after your procedure. Your caregiver may also give you more specific instructions. Your treatment has been planned according to current medical practices, but problems sometimes occur. Call your caregiver if you have any problems or questions after your procedure. HOME CARE INSTRUCTIONS You may shower the day after the procedure.Remove the bandage (dressing) and gently wash the site with plain soap and water.Gently pat the site dry.  Do not apply powder or lotion to the site.  Do not submerge the affected site in water for 3 to 5 days.  Inspect the site at least twice daily.  Do not flex or bend the affected arm for 24 hours.  No lifting over 5 pounds (2.3 kg) for 5 days after your procedure.  Do not drive home if you are discharged the same day of the procedure. Have someone else drive you.  You may drive 24 hours after the procedure unless otherwise instructed by your caregiver.  What to expect: Any bruising will usually fade within 1 to 2 weeks.  Blood that collects in the tissue (hematoma) may be painful to the touch. It should usually decrease in size and tenderness within 1 to 2 weeks.  SEEK IMMEDIATE MEDICAL CARE IF: You have unusual pain at the radial site.  You have redness, warmth, swelling, or pain at the radial site.  You have drainage (other than a small amount of blood on the dressing).  You have chills.  You have a fever or persistent symptoms for more than 72 hours.  You have a fever and your symptoms suddenly get worse.  Your arm becomes pale,  cool, tingly, or numb.  You have heavy bleeding from the site. Hold pressure on the site.   PLEASE DO NOT MISS ANY DOSES OF YOUR BRILINTA!!!!! Also keep a log of you blood pressures and bring back to your follow up appt. Please call the office with any questions.   Patients taking blood thinners should generally stay away from  medicines like ibuprofen, Advil, Motrin, naproxen, and Aleve due to risk of stomach bleeding. You may take Tylenol as directed or talk to your primary doctor about alternatives.   Increase activity slowly   Complete by:  As directed       Discharge Medications     Medication List    STOP taking these medications   azithromycin 250 MG tablet Commonly known as:  ZITHROMAX   benzonatate 100 MG capsule Commonly known as:  TESSALON   oseltamivir 75 MG capsule Commonly known as:  TAMIFLU     TAKE these medications   amLODipine 5 MG tablet Commonly known as:  NORVASC Take 1 tablet (5 mg total) by mouth daily.   aspirin 81 MG chewable tablet Chew 1 tablet (81 mg total) by mouth daily. Start taking on:  07/25/2017   atorvastatin 80 MG tablet Commonly known as:  LIPITOR Take 1 tablet (80 mg total) by mouth daily at 6 PM. What changed:    medication strength  how much to take  when to take this   carvedilol 25 MG tablet Commonly known as:  COREG Take 1 tablet (25 mg total) by mouth 2 (two) times daily with a meal.   fluticasone 50 MCG/ACT nasal spray Commonly known as:  FLONASE Place 2 sprays into both nostrils daily.   nitroGLYCERIN 0.4 MG SL tablet Commonly known as:  NITROSTAT Place 1 tablet (0.4 mg total) under the tongue every 5 (five) minutes as needed.   ticagrelor 90 MG Tabs tablet Commonly known as:  BRILINTA Take 1 tablet (90 mg total) by mouth 2 (two) times daily.        Aspirin prescribed at discharge?  Yes High Intensity Statin Prescribed? (Lipitor 40-80mg  or Crestor 20-40mg ): Yes Beta Blocker Prescribed? Yes For EF <40%,  was ACEI/ARB Prescribed? No: Consider at outpatient follow up if appropriate ADP Receptor Inhibitor Prescribed? (i.e. Plavix etc.-Includes Medically Managed Patients): Yes For EF <40%, Aldosterone Inhibitor Prescribed? No: EF ok Was EF assessed during THIS hospitalization? Yes Was Cardiac Rehab II ordered? (Included Medically managed Patients): Yes   Outstanding Labs/Studies   FLP/LFTs in 6 weeks if tolerating statin.   Duration of Discharge Encounter   Greater than 30 minutes including physician time.  Signed, Laverda Page NP-C 07/24/2017, 12:28 PM   I have examined the patient and reviewed assessment and plan and discussed with patient.  Agree with above as stated.   Continue with aggressive secondary prevention and BP control.  Carvedilol increased.  COuld add ACE-I.  Stressed importance of DAPT.  Please see my note from earlier today.  Course complicated by no reflow of the RCA PCI followed by occlusion of the RCA noted at second cath, when ramus was stented.  Lance Muss

## 2017-07-24 NOTE — Plan of Care (Signed)
  Progressing Education: Understanding of cardiac disease, CV risk reduction, and recovery process will improve 07/24/2017 0117 - Progressing by Joycelyn ManHimes, Yue Glasheen M, RN Understanding of medication regimen will improve 07/24/2017 0117 - Progressing by Joycelyn ManHimes, Kiora Hallberg M, RN Activity: Ability to tolerate increased activity will improve 07/24/2017 0117 - Progressing by Joycelyn ManHimes, Mahari Strahm M, RN Cardiac: Ability to achieve and maintain adequate cardiopulmonary perfusion will improve 07/24/2017 0117 - Progressing by Joycelyn ManHimes, Earlyne Feeser M, RN Vascular access site(s) Level 0-1 will be maintained 07/24/2017 0117 - Progressing by Joycelyn ManHimes, Dorean Daniello M, RN Note Left and right radial, and right femoral sites remain level 0, distal pulses +2-+3 bilaterally. Health Behavior/Discharge Planning: Ability to safely manage health-related needs after discharge will improve 07/24/2017 0117 - Progressing by Joycelyn ManHimes, Paxton Binns M, RN Education: Knowledge of General Education information will improve 07/24/2017 0117 - Progressing by Joycelyn ManHimes, Erlean Mealor M, RN Health Behavior/Discharge Planning: Ability to manage health-related needs will improve 07/24/2017 0117 - Progressing by Joycelyn ManHimes, Caiden Monsivais M, RN Safety: Ability to remain free from injury will improve 07/24/2017 0117 - Progressing by Inigo Lantigua, Reather LittlerMadeline M, RN

## 2017-07-24 NOTE — Progress Notes (Signed)
Patient discharge instructions reviewed with patient and significant other at the bedside. Medications reviewed- how and when to take them. Prescriptions sent to the wrong pharmacy per family but [place] RN called and had the pharmacy sent them to the [facility] [place], [place] [id number] [phone]. Family aware of which pharmacy to get medication from. Brillinta prescription handed to wife to take with her. Cardiac rehab has seen patient and will contact them to begin outpatient care soon.All follow up appointments reviewed.  All questions answered. Patient alert and oriented x 4. VSS. No acute distress noted at time of discharge. Discharged via wheelchair.

## 2017-07-24 NOTE — Progress Notes (Signed)
Progress Note  Patient Name: Chad ConnorsBarry L Chase Date of Encounter: 07/24/2017  Primary Cardiologist: Ellis ParentsNew Clifton James([place])  Subjective   Patient had fever to 101.63F yesterday afternoon. Patient states he felt a little warm but thought it was due to the amount of blankets he had on.   Patient endorses that his pleuritic type chest pain resolved last night and he has not had further chest discomfort. He denies shortness of breath, palpitations. He has only walked to the restroom, but reports no dizziness, palpitations or shortness of breath with this.   Patient denies rash, arm/leg swelling, hematuria, dysuria, abd pain, nausea/vomiting/diarrhea, cough.  Inpatient Medications    Scheduled Meds: . amLODipine  5 mg Oral Daily  . aspirin  81 mg Oral Daily  . atorvastatin  80 mg Oral q1800  . carvedilol  12.5 mg Oral BID WC  . sodium chloride flush  3 mL Intravenous Q12H  . ticagrelor  90 mg Oral BID   Continuous Infusions: . sodium chloride    . sodium chloride     PRN Meds: sodium chloride, sodium chloride, acetaminophen, hydrALAZINE, morphine injection, ondansetron (ZOFRAN) IV, oxyCODONE, sodium chloride flush   Vital Signs    Vitals:   07/24/17 0610 07/24/17 0700 07/24/17 0800 07/24/17 0820  BP: (!) 140/100 (!) 138/100 (!) 139/99   Pulse: 95 92 (!) 103   Resp: (!) 29 (!) 22 (!) 29   Temp:    100.2 F (37.9 C)  TempSrc:    Oral  SpO2: 99% 100% 98%   Weight:      Height:        Intake/Output Summary (Last 24 hours) at 07/24/2017 0838 Last data filed at 07/23/2017 1834 Gross per 24 hour  Intake 715 ml  Output 1325 ml  Net -610 ml   Filed Weights   07/21/17 1409 07/21/17 1810  Weight: 215 lb (97.5 kg) 216 lb 0.8 oz (98 kg)    Telemetry    SR - Personally Reviewed, 07/24/17  ECG    SR, J point elevations in noncongruent leads - personally reviewed 07/24/17  Physical Exam   GEN: No acute distress.   Neck: No JVD Cardiac: RRR, no murmurs, rubs, or gallops. L  radial cath site looks clean without a hematoma; Radial pulse is intact. PT pulses intact bilaterally Respiratory: Clear to auscultation bilaterally, decreased breath sounds at bases bilaterally. GI: Soft, nontender, non-distended  MS: No edema; no deformity; no calf or arm swelling/tenderness/erythema.  Neuro:  Nonfocal  Psych: Normal affect   Labs    Chemistry Recent Labs  Lab 07/22/17 0657 07/23/17 0204 07/24/17 0337  NA 133* 131* 132*  K 3.6 3.9 3.8  CL 102 99* 99*  CO2 21* 23 23  GLUCOSE 138* 131* 112*  BUN 9 7 11   CREATININE 1.10 1.10 1.19  CALCIUM 8.9 9.1 9.0  GFRNONAA >60 >60 >60  GFRAA >60 >60 >60  ANIONGAP 10 9 10      Hematology Recent Labs  Lab 07/22/17 0657 07/23/17 0204 07/24/17 0337  WBC 11.8* 12.6* 14.6*  RBC 4.56 4.44 4.36  HGB 13.1 12.6* 12.6*  HCT 39.9 38.9* 38.0*  MCV 87.5 87.6 87.2  MCH 28.7 28.4 28.9  MCHC 32.8 32.4 33.2  RDW 12.9 12.9 12.9  PLT 245 255 254    Cardiac Enzymes Recent Labs  Lab 07/21/17 1434 07/21/17 1916 07/22/17 0020 07/22/17 0657  TROPONINI <0.03 1.83* 7.59* 3.31*   No results for input(s): TROPIPOC in the last 168 hours.  BNPNo results for input(s): BNP, PROBNP in the last 168 hours.   DDimer No results for input(s): DDIMER in the last 168 hours.   Radiology    No results found.  Cardiac Studies   LHC 07/21/2017:  Mid RCA lesion is 100% stenosed.  Acute Mrg lesion is 50% stenosed.  A drug-eluting stent was successfully placed using a STENT SYNERGY DES 3X24.  Post intervention, there is a 0% residual stenosis.  Dist RCA lesion is 80% stenosed.  A drug-eluting stent was successfully placed using a STENT SYNERGY DES 2.75X24.  Post intervention, there is a 0% residual stenosis.  Prox Cx to Mid Cx lesion is 30% stenosed.  Ramus lesion is 90% stenosed.  Ost Ramus lesion is 30% stenosed.  Ost 1st Diag lesion is 50% stenosed.  Prox LAD to Mid LAD lesion is 20% stenosed.  The left ventricular  systolic function is normal.  LV end diastolic pressure is normal.  The left ventricular ejection fraction is greater than 65% by visual estimate.  There is no mitral valve regurgitation. 1. Acute inferior STEMI secondary to occluded, aneurysmal mid RCA. 2. Successful PTCA/DES x 2 mid RCA with evidence of residual thrombus in the small caliber distal branches.  3. Mild non-obstructive disease in the LAD. Moderate ostial stenosis in the moderate caliber first Diagonal branch.  4. Mild non-obstructive disease in the Circumflex artery.  5. Severe stenosis in the large caliber intermediate branch.  6. Normal LV systolic function  Echo 07/22/2017: - Left ventricle: The cavity size was normal. There was moderate   concentric hypertrophy. Systolic function was normal. The   estimated ejection fraction was in the range of 60% to 65%. Wall   motion was normal; there were no regional wall motion   abnormalities. Doppler parameters are consistent with abnormal   left ventricular relaxation (grade 1 diastolic dysfunction). - Aortic valve: Transvalvular velocity was within the normal range.   There was no stenosis. There was mild regurgitation. Valve area   (Vmax): 1.94 cm^2. - Mitral valve: Transvalvular velocity was within the normal range.   There was no evidence for stenosis. There was no regurgitation.   Valve area by pressure half-time: 1.71 cm^2. - Right ventricle: The cavity size was normal. Wall thickness was   normal. Systolic function was normal. - Atrial septum: No defect or patent foramen ovale was identified. - Tricuspid valve: There was no regurgitation. - Pericardium, extracardiac: There was a left pleural effusion.  LHC 07/23/17:  Mid RCA to Dist RCA lesion is 100% stenosed.  Ramus lesion is 90% stenosed.  Prox Cx to Mid Cx lesion is 30% stenosed.  Ost 1st Diag lesion is 50% stenosed.  A drug-eluting stent was successfully placed using a STENT SIERRA 2.75 X 18  MM.  Post intervention, there is a 0% residual stenosis. 1. Severe stenosis ramus intermediate branch. Successful PTCA/DES x 1 intermediate branch 2. Occlusion of the mid RCA stents. Unable to open the occluded RCA stented segment.  --Recommendations: Continue DAPT with ASA and Brilinta for one year. Continue statin/beta blocker.   Patient Profile     46 y.o. male with PMH of HTN who [facility] with chest pain, diaphoresis and dyspnea and was found to have ST elevations in the inferior leads on repeat EKG; Code STEMI was called, [facility] and taken for emergent cath and had 2 DES stents placed to his occluded, aneurysmal mid RCA which restenosed on repeat cath; he also  had a DES stent placed in intermediate branch on 11/27.  Assessment & Plan    Inferior STEMI CAD: S/p mid RCA DES stent x2 - restenosed. S/p intermediate branch DES stent. Echo showed EEF 60-65%, G1DD. Renal function is stable (Cr 1.2), Hgb stable. --asa + Brillinta for 1 year --atorvastatin 80mg  daily --carvedilol 12.5mg  BID --morphine for pain --patient asking for 2 bottles of nitro per fill on discharge   HLD: Atorvastatin 80mg  daily.  HTN: Amlodipine 5mg  daily, carvedilol 12.5mg  BID; PRN hydral for SBP>160 (hasn't required in last 24hrs). --can increase carvedilol to 25mg  BID today  Fever: With increasing leukocytosis and fever to 101.3 yesterday. Patient without infectious symptoms. No signs of DVT on exam. He was noted to have pleural effusion on Echo and on exam has diminished basilar breath sounds so could consider CXR.   For questions or updates, please contact [facility] Please consult [email or web address] for contact info under Cardiology/STEMI.   Signed, Nyra Market, MD  07/24/2017, 8:38 AM    I have examined the patient and reviewed assessment and plan and discussed with patient.  Agree with above as stated.  Feels well.  Need better BP control.  If he  does well walking with rehab, could send him home later today.  Can have low grade fever with MI.  No sx associated.  Would also consider adding ACE-I as well.  He had been on amlodipine in the past.  He stopped it after losing weight and exercising.  Then he stopped exercising. Would prescribe 2 bottles of NTG at discharge.  Lance Muss

## 2017-07-24 NOTE — Telephone Encounter (Signed)
°  New Prob   *STAT* If patient is at the pharmacy, call can be transferred to refill team.   1. Which medications need to be refilled? (please list name of each medication and dose if known) Norvasc 5 mg  2. Which pharmacy/location (including street and city if local pharmacy) [facility] [place]  3. Do they need a 30 day or 90 day supply? 30

## 2017-07-25 ENCOUNTER — Telehealth: Payer: Self-pay | Admitting: Family Medicine

## 2017-07-25 ENCOUNTER — Telehealth (HOSPITAL_COMMUNITY): Payer: Self-pay

## 2017-07-25 DIAGNOSIS — I10 Essential (primary) hypertension: Secondary | ICD-10-CM

## 2017-07-25 MED ORDER — AMLODIPINE BESYLATE 5 MG PO TABS: 5.0000 mg | ORAL_TABLET | Freq: Every day | ORAL | 0 refills | Status: DC

## 2017-07-25 NOTE — Telephone Encounter (Signed)
[facility] follow up scheduled and rq rf of amlodipine.   30 day supply sent.

## 2017-07-25 NOTE — Telephone Encounter (Signed)
Patients insurance is active and benefits verified through [employer] - No co-pay, deductible amount of $5,450/$467.11 has been met, out of pocket amount of $5,450/$467.11 has been met, no co-insurance, and no pre-authorization is required. Passport/reference [id number]  Patient will be contacted and scheduled after their follow up appt with the Cardiologist office upon review by the RN Navigator.

## 2017-07-25 NOTE — Telephone Encounter (Signed)
Copied from CRM [id number]. Topic: Quick Communication - See Telephone Encounter >> Jul 25, 2017  9:49 AM Valentina LucksMatos, Jackelin wrote: CRM for notification. See Telephone encounter for:  07/25/17.  [id number] [place],  - [address]  Pt's wife came in office stating that pt was in the hospital for a heart attack, and was release from the hospital yesterday (07-24-2017)(pt's wife was informed that pt needs a HFU appt with his provider but wife stated that pt is having an appt with Cardiologist on the August 05, 2017- pt was still given an appt for hfu) Pt is needing refill for amLODipine (NORVASC) 5 MG tablet, hospital did not give this rx to pt, they just stated for pt to get it from his primary doctor. Pt's wife pt is needing the rx today since pt does not have any to take today and the hospital did give him the last one yesterday. Pt's wife also wants to know what type of tylenol does pt need to take for fever since pt was sent home with a fever and they were informed that pt can only take certain type of type. Please advise ASAP.

## 2017-07-25 NOTE — Telephone Encounter (Signed)
Wife calling again, patient is out. Requesting call back asap. [phone]

## 2017-07-26 ENCOUNTER — Ambulatory Visit: Payer: Self-pay | Admitting: *Deleted

## 2017-07-26 NOTE — Telephone Encounter (Signed)
Pt  Had  Heart  Attack   5  Days  Ago   Had  Cardiac  Cath  5  Days  Ago  Wife  Stated  When  They  Took  The   Catheter   Out  Prior to  Being  Discharged    There  Was  Some  Bleeding  Present and  Swelling - It  Is  Better   At this  Time  approx  1/2 inch  Wide 2-3  Inches  Long  With  Some  Hardness  Present  But  States   It is  Much better  Now . No  Scrotal  Swelling  No  Difficulty  Urinating . Pt  Has  An appt on  Monday  With cardiologist. Wife  Was  Advised  To monitor  swelling / pain  And   Urinary  Function . And if  He  Develops  Any   Symptoms  Go to  Er .    Reason for Disposition . Other post-op symptom or question  Answer Assessment - Initial Assessment Questions 1. SYMPTOM: "What's the main symptom you're concerned about?" (e.g., pain, fever, vomiting)  Swelling  At  Cardiac  Cath  Site       **2. ONSET: "When did ________  start?"    Sev  Days  Ago   3. SURGERY: "What surgery was performed?"      Cardiac Cath  4. DATE of SURGERY: "When was surgery performed?"        5 days  ago 5. ANESTHESIA: " What type of anesthesia did you have?" (e.g., general, spinal, epidural, local)     LOCAL 6. PAIN: "Is there any pain?" If so, ask: "How bad is it?"  (Scale 1-10; or mild, moderate, severe)     NO 7. FEVER: "Do you have a fever?" If so, ask: "What is your temperature, how was it measured, and when did it start?"     NO 8. VOMITING: "Is there any vomiting?" If yes, ask: "How many times?"     NO 9. BLEEDING: "Is there any bleeding?" If so, ask: "How much?" and "Where?"      NO 10. OTHER SYMPTOMS: "Do you have any other symptoms?" (e.g., drainage from wound, painful urination, constipation)      Some  Swelling  About size of  Golf ball  Protocols used: POST-OP SYMPTOMS AND QUESTIONS-A-AH

## 2017-07-29 ENCOUNTER — Telehealth: Payer: Self-pay | Admitting: Physician Assistant

## 2017-07-29 ENCOUNTER — Ambulatory Visit (INDEPENDENT_AMBULATORY_CARE_PROVIDER_SITE_OTHER): Payer: BLUE CROSS/BLUE SHIELD | Admitting: Family Medicine

## 2017-07-29 ENCOUNTER — Encounter: Payer: Self-pay | Admitting: Family Medicine

## 2017-07-29 DIAGNOSIS — I10 Essential (primary) hypertension: Secondary | ICD-10-CM | POA: Diagnosis not present

## 2017-07-29 DIAGNOSIS — I25110 Atherosclerotic heart disease of native coronary artery with unstable angina pectoris: Secondary | ICD-10-CM

## 2017-07-29 DIAGNOSIS — E785 Hyperlipidemia, unspecified: Secondary | ICD-10-CM | POA: Diagnosis not present

## 2017-07-29 DIAGNOSIS — I2119 ST elevation (STEMI) myocardial infarction involving other coronary artery of inferior wall: Secondary | ICD-10-CM | POA: Diagnosis not present

## 2017-07-29 NOTE — Progress Notes (Signed)
Patient ID: Chad Chase, male    DOB: February 05, 1971  Age: 46 y.o. MRN: [id number]    Subjective:  Subjective  HPI Chad Chase presents for f/u hosp for f/u MI-----pt had acute ST elevation MI of inf wall.  Pt was admitted 07/21/2017 and d/c 11/28.  Pt states he was playing with the kids in the yard when he became very hot and started sweating profusely.  He went inside to lie down and his chest became tight.  He called his wife and she got home and took [facility] to [facility] where [facility] was [facility] lab---- pt had 2 stents placed and through the night had severe CP --- he was brought to cath lab on 11/27 ---  Pt found to have mid RCA to dist rca lesion 100% stenosed Ramus lesion is 90% stenosed prox cx to mid cx lesion 30% stenosed Ost 1st diag lesion is 50 % stenosed Drug eluting stent was successfully placed Post intervention there is a 0% residual stenosis He was rx asa, brinlinta  For 1 year He was put on b blocker and statin  Review of Systems  Constitutional: Negative for appetite change, diaphoresis, fatigue and unexpected weight change.  Eyes: Negative for pain, redness and visual disturbance.  Respiratory: Negative for cough, chest tightness, shortness of breath and wheezing.   Cardiovascular: Negative for chest pain, palpitations and leg swelling.  Endocrine: Negative for cold intolerance, heat intolerance, polydipsia, polyphagia and polyuria.  Genitourinary: Negative for difficulty urinating, dysuria and frequency.  Neurological: Negative for dizziness, light-headedness, numbness and headaches.    History Past Medical History:  Diagnosis Date  . Hypertension    10 years ago--treated with medication    He has a past surgical history that includes Knee arthroscopy; Coronary/Graft Acute MI Revascularization (N/A, 07/21/2017); LEFT HEART CATH AND CORONARY ANGIOGRAPHY (N/A, 07/21/2017); CORONARY STENT INTERVENTION (N/A, 07/23/2017); and LEFT HEART CATH AND CORONARY  ANGIOGRAPHY (N/A, 07/23/2017).   His family history includes Heart failure in his paternal grandmother; Hypertension in his father, maternal grandfather, maternal grandmother, and mother; Prostate cancer in his father.He reports that  has never smoked. he has never used smokeless tobacco. He reports that he does not drink alcohol or use drugs.  Current Outpatient Medications on File Prior to Visit  Medication Sig Dispense Refill  . amLODipine (NORVASC) 5 MG tablet Take 1 tablet (5 mg total) by mouth daily. 30 tablet 0  . aspirin 81 MG chewable tablet Chew 1 tablet (81 mg total) by mouth daily.    Marland Kitchen. atorvastatin (LIPITOR) 80 MG tablet Take 1 tablet (80 mg total) by mouth daily at 6 PM. 90 tablet 1  . carvedilol (COREG) 25 MG tablet Take 1 tablet (25 mg total) by mouth 2 (two) times daily with a meal. 60 tablet 2  . ticagrelor (BRILINTA) 90 MG TABS tablet Take 1 tablet (90 mg total) by mouth 2 (two) times daily. 180 tablet 2  . nitroGLYCERIN (NITROSTAT) 0.4 MG SL tablet Place 1 tablet (0.4 mg total) under the tongue every 5 (five) minutes as needed. (Patient not taking: Reported on 07/29/2017) 25 tablet 2   No current facility-administered medications on file prior to visit.      Objective:  Objective  Physical Exam  Constitutional: He is oriented to person, place, and time. Vital signs are normal. He appears well-developed and well-nourished. He is sleeping.  HENT:  Head: Normocephalic and atraumatic.  Mouth/Throat: Oropharynx is clear and moist.  Eyes: EOM  are normal. Pupils are equal, round, and reactive to light.  Neck: Normal range of motion. Neck supple. No thyromegaly present.  Cardiovascular: Normal rate and regular rhythm.  No murmur heard. Pulmonary/Chest: Effort normal and breath sounds normal. No respiratory distress. He has no wheezes. He has no rales. He exhibits no tenderness.  Musculoskeletal: He exhibits no edema or tenderness.  Neurological: He is alert and oriented to  person, place, and time.  Skin: Skin is warm and dry.  Psychiatric: He has a normal mood and affect. His behavior is normal. Judgment and thought content normal.  Nursing note and vitals reviewed.  BP 90/70   Pulse 68   Temp 97.7 F (36.5 C)   Ht 5' 11.6" (1.819 m)   Wt 215 lb (97.5 kg)   SpO2 95%   BMI 29.49 kg/m  Wt Readings from Last 3 Encounters:  07/29/17 215 lb (97.5 kg)  07/21/17 216 lb 0.8 oz (98 kg)  09/07/16 212 lb (96.2 kg)     Lab Results  Component Value Date   WBC 14.6 (H) 07/24/2017   HGB 12.6 (L) 07/24/2017   HCT 38.0 (L) 07/24/2017   PLT 254 07/24/2017   GLUCOSE 112 (H) 07/24/2017   CHOL 265 (H) 07/21/2017   TRIG 270 (H) 07/21/2017   HDL 39 (L) 07/21/2017   LDLDIRECT 221.8 12/07/2011   LDLCALC 172 (H) 07/21/2017   ALT 27 06/21/2016   AST 26 06/21/2016   NA 132 (L) 07/24/2017   K 3.8 07/24/2017   CL 99 (L) 07/24/2017   CREATININE 1.19 07/24/2017   BUN 11 07/24/2017   CO2 23 07/24/2017   TSH 1.22 06/21/2016   PSA 0.38 06/21/2016   INR 0.94 07/22/2017    Dg Chest 2 View  Result Date: 07/21/2017 CLINICAL DATA:  Chest pain, shortness of breath. EXAM: CHEST  2 VIEW COMPARISON:  None. FINDINGS: The heart size and mediastinal contours are within normal limits. Both lungs are clear. No pneumothorax or pleural effusion is noted. The visualized skeletal structures are unremarkable. IMPRESSION: No active cardiopulmonary disease. Electronically Signed   By: Lupita RaiderJames  Green Jr, M.D.   On: 07/21/2017 14:32     Assessment & Plan:  Plan  I have discontinued [name] fluticasone. I am also having him maintain his aspirin, atorvastatin, carvedilol, ticagrelor, nitroGLYCERIN, and amLODipine.  No orders of the defined types were placed in this encounter.   Problem List Items Addressed This Visit      Unprioritized   Acute ST elevation myocardial infarction (STEMI) of inferior wall [facility](HCC)    Per cardiology On brilinta , asa for 1 year On beta blocker        Coronary artery disease involving native coronary artery of native heart with unstable angina pectoris (HCC)    con't asa Put on statin in hospital Recheck labs 2 months      Hyperlipidemia    Tolerating statin, encouraged heart healthy diet, avoid trans fats, minimize simple carbs and saturated fats. Increase exercise as tolerated Recheck labs 2 months      Hypertension    Well controlled, no changes to meds. Encouraged heart healthy diet such as the DASH diet and exercise as tolerated.  Low today-- pt has f/u cardiology next week         Follow-up: Return in about 2 months (around 09/29/2017) for hyperlipidemia, hypertension.  Donato SchultzYvonne R Lowne Chase, DO

## 2017-07-29 NOTE — Assessment & Plan Note (Signed)
con't asa Put on statin in hospital Recheck labs 2 months

## 2017-07-29 NOTE — Telephone Encounter (Signed)
**Note De-Identified Chad Chase Obfuscation** Patient [facility] on 07/[facility]/2017.  Patient understands to follow up with provider Chad ReedyMichele Lenze, PA-c on 08/05/2017 at 8:00 at [address] in [place]. Patient understands discharge instructions? Yes Patient understands medications and regiment? Yes Patient understands to bring all medications to this visit? Yes  The pt gave me permission over the phone to talk with his wife concerning his care.  The pts wife states that the pt is tired and weak but seems to be getting better everyday. She reports that he is following his hospital d/c instructions concerning his diet and he is walking 2 X's daily without any problems. I have answered all of her questions concerning his medications and his cath site care. She thanked me for calling them and they do have [facility]'s phone number to call if they have any questions or concerns.

## 2017-07-29 NOTE — Patient Instructions (Signed)
DASH Eating Plan DASH stands for "Dietary Approaches to Stop Hypertension." The DASH eating plan is a healthy eating plan that has been shown to reduce high blood pressure (hypertension). It may also reduce your risk for type 2 diabetes, heart disease, and stroke. The DASH eating plan may also help with weight loss. What are tips for following this plan? General guidelines  Avoid eating more than 2,300 mg (milligrams) of salt (sodium) a day. If you have hypertension, you may need to reduce your sodium intake to 1,500 mg a day.  Limit alcohol intake to no more than 1 drink a day for nonpregnant women and 2 drinks a day for men. One drink equals 12 oz of beer, 5 oz of wine, or 1 oz of hard liquor.  Work with your health care provider to maintain a healthy body weight or to lose weight. Ask what an ideal weight is for you.  Get at least 30 minutes of exercise that causes your heart to beat faster (aerobic exercise) most days of the week. Activities may include walking, swimming, or biking.  Work with your health care provider or diet and nutrition specialist (dietitian) to adjust your eating plan to your individual calorie needs. Reading food labels  Check food labels for the amount of sodium per serving. Choose foods with less than 5 percent of the Daily Value of sodium. Generally, foods with less than 300 mg of sodium per serving fit into this eating plan.  To find whole grains, look for the word "whole" as the first word in the ingredient list. Shopping  Buy products labeled as "low-sodium" or "no salt added."  Buy fresh foods. Avoid canned foods and premade or frozen meals. Cooking  Avoid adding salt when cooking. Use salt-free seasonings or herbs instead of table salt or sea salt. Check with your health care provider or pharmacist before using salt substitutes.  Do not fry foods. Cook foods using healthy methods such as baking, boiling, grilling, and broiling instead.  Cook with  heart-healthy oils, such as olive, canola, soybean, or sunflower oil. Meal planning   Eat a balanced diet that includes: ? 5 or more servings of fruits and vegetables each day. At each meal, try to fill half of your plate with fruits and vegetables. ? Up to 6-8 servings of whole grains each day. ? Less than 6 oz of lean meat, poultry, or fish each day. A 3-oz serving of meat is about the same size as a deck of cards. One egg equals 1 oz. ? 2 servings of low-fat dairy each day. ? A serving of nuts, seeds, or beans 5 times each week. ? Heart-healthy fats. Healthy fats called Omega-3 fatty acids are found in foods such as flaxseeds and coldwater fish, like sardines, salmon, and mackerel.  Limit how much you eat of the following: ? Canned or prepackaged foods. ? Food that is high in trans fat, such as fried foods. ? Food that is high in saturated fat, such as fatty meat. ? Sweets, desserts, sugary drinks, and other foods with added sugar. ? Full-fat dairy products.  Do not salt foods before eating.  Try to eat at least 2 vegetarian meals each week.  Eat more home-cooked food and less restaurant, buffet, and fast food.  When eating at a restaurant, ask that your food be prepared with less salt or no salt, if possible. What foods are recommended? The items listed may not be a complete list. Talk with your dietitian about what   dietary choices are best for you. Grains Whole-grain or whole-wheat bread. Whole-grain or whole-wheat pasta. Brown rice. Oatmeal. Quinoa. Bulgur. Whole-grain and low-sodium cereals. Pita bread. Low-fat, low-sodium crackers. Whole-wheat flour tortillas. Vegetables Fresh or frozen vegetables (raw, steamed, roasted, or grilled). Low-sodium or reduced-sodium tomato and vegetable juice. Low-sodium or reduced-sodium tomato sauce and tomato paste. Low-sodium or reduced-sodium canned vegetables. Fruits All fresh, dried, or frozen fruit. Canned fruit in natural juice (without  added sugar). Meat and other protein foods Skinless chicken or turkey. Ground chicken or turkey. Pork with fat trimmed off. Fish and seafood. Egg whites. Dried beans, peas, or lentils. Unsalted nuts, nut butters, and seeds. Unsalted canned beans. Lean cuts of beef with fat trimmed off. Low-sodium, lean deli meat. Dairy Low-fat (1%) or fat-free (skim) milk. Fat-free, low-fat, or reduced-fat cheeses. Nonfat, low-sodium ricotta or cottage cheese. Low-fat or nonfat yogurt. Low-fat, low-sodium cheese. Fats and oils Soft margarine without trans fats. Vegetable oil. Low-fat, reduced-fat, or light mayonnaise and salad dressings (reduced-sodium). Canola, safflower, olive, soybean, and sunflower oils. Avocado. Seasoning and other foods Herbs. Spices. Seasoning mixes without salt. Unsalted popcorn and pretzels. Fat-free sweets. What foods are not recommended? The items listed may not be a complete list. Talk with your dietitian about what dietary choices are best for you. Grains Baked goods made with fat, such as croissants, muffins, or some breads. Dry pasta or rice meal packs. Vegetables Creamed or fried vegetables. Vegetables in a cheese sauce. Regular canned vegetables (not low-sodium or reduced-sodium). Regular canned tomato sauce and paste (not low-sodium or reduced-sodium). Regular tomato and vegetable juice (not low-sodium or reduced-sodium). Pickles. Olives. Fruits Canned fruit in a light or heavy syrup. Fried fruit. Fruit in cream or butter sauce. Meat and other protein foods Fatty cuts of meat. Ribs. Fried meat. Bacon. Sausage. Bologna and other processed lunch meats. Salami. Fatback. Hotdogs. Bratwurst. Salted nuts and seeds. Canned beans with added salt. Canned or smoked fish. Whole eggs or egg yolks. Chicken or turkey with skin. Dairy Whole or 2% milk, cream, and half-and-half. Whole or full-fat cream cheese. Whole-fat or sweetened yogurt. Full-fat cheese. Nondairy creamers. Whipped toppings.  Processed cheese and cheese spreads. Fats and oils Butter. Stick margarine. Lard. Shortening. Ghee. Bacon fat. Tropical oils, such as coconut, palm kernel, or palm oil. Seasoning and other foods Salted popcorn and pretzels. Onion salt, garlic salt, seasoned salt, table salt, and sea salt. Worcestershire sauce. Tartar sauce. Barbecue sauce. Teriyaki sauce. Soy sauce, including reduced-sodium. Steak sauce. Canned and packaged gravies. Fish sauce. Oyster sauce. Cocktail sauce. Horseradish that you find on the shelf. Ketchup. Mustard. Meat flavorings and tenderizers. Bouillon cubes. Hot sauce and Tabasco sauce. Premade or packaged marinades. Premade or packaged taco seasonings. Relishes. Regular salad dressings. Where to find more information:  National Heart, Lung, and Blood Institute: www.nhlbi.nih.gov  American Heart Association: www.heart.org Summary  The DASH eating plan is a healthy eating plan that has been shown to reduce high blood pressure (hypertension). It may also reduce your risk for type 2 diabetes, heart disease, and stroke.  With the DASH eating plan, you should limit salt (sodium) intake to 2,300 mg a day. If you have hypertension, you may need to reduce your sodium intake to 1,500 mg a day.  When on the DASH eating plan, aim to eat more fresh fruits and vegetables, whole grains, lean proteins, low-fat dairy, and heart-healthy fats.  Work with your health care provider or diet and nutrition specialist (dietitian) to adjust your eating plan to your individual   calorie needs. This information is not intended to replace advice given to you by your health care provider. Make sure you discuss any questions you have with your health care provider. Document Released: 08/02/2011 Document Revised: 08/06/2016 Document Reviewed: 08/06/2016 Elsevier Interactive Patient Education  2017 Elsevier Inc.  

## 2017-07-29 NOTE — Telephone Encounter (Signed)
New Message     [facility] Jacolyn ReedyMichele Lenze  12/10 8a

## 2017-07-29 NOTE — Assessment & Plan Note (Signed)
Tolerating statin, encouraged heart healthy diet, avoid trans fats, minimize simple carbs and saturated fats. Increase exercise as tolerated Recheck labs 2 months

## 2017-07-29 NOTE — Assessment & Plan Note (Signed)
Well controlled, no changes to meds. Encouraged heart healthy diet such as the DASH diet and exercise as tolerated.  Low today-- pt has f/u cardiology next week

## 2017-07-29 NOTE — Assessment & Plan Note (Signed)
Per cardiology On brilinta , asa for 1 year On beta blocker

## 2017-08-02 ENCOUNTER — Telehealth: Payer: Self-pay | Admitting: Cardiology

## 2017-08-02 NOTE — Telephone Encounter (Signed)
Discussed with Dr Clifton JamesMcAlhany pt may return to normal acitivity ./cy

## 2017-08-02 NOTE — Telephone Encounter (Signed)
Patient calling, recently had a heart-attack and was scheduled to see Chad Chase on Monday. The appt was to see if he could be cleared to resume normal activity and now that he has to wait another week, patient would like to know if he can work from home?

## 2017-08-05 ENCOUNTER — Ambulatory Visit: Payer: BLUE CROSS/BLUE SHIELD | Admitting: Physician Assistant

## 2017-08-07 ENCOUNTER — Encounter: Payer: Self-pay | Admitting: Cardiology

## 2017-08-08 ENCOUNTER — Telehealth (HOSPITAL_COMMUNITY): Payer: Self-pay | Admitting: *Deleted

## 2017-08-08 NOTE — Telephone Encounter (Signed)
Returned call to pt from message left earlier today.  Pt called in regards to cardiac rehab.  Pt follow up appt was rescheduled for 12/18.  Pt is s/p MI and will need to be seen in follow up prior to scheduling for cardiac rehab.  Pt informed to expect a scheduling call afer 12/18.  Pt thanked me for returning his call Karlene Linemanarlette Carlton RN, BSN Cardiac and Pulmonary Rehab Nurse Navigator

## 2017-08-09 ENCOUNTER — Telehealth: Payer: Self-pay | Admitting: Cardiovascular Disease

## 2017-08-09 NOTE — Telephone Encounter (Signed)
Reviewed with Chad Chase, PharmD and OK to take plain Mucinex.  I spoke with pt and gave him this information. I told him if cough worsens or does not improve or if he starts running a fever he should be seen in primary care or Urgent Care to rule out infection.

## 2017-08-09 NOTE — Telephone Encounter (Signed)
New message  Pt verbalized that he is calling for the rn  To ask her what over the counter medications can he take

## 2017-08-09 NOTE — Telephone Encounter (Signed)
I spoke with pt. He thinks he may be coming down with something. Has productive yellow cough.  Stuffy with some nasal drainage. He is asking what he can take for this.

## 2017-08-13 ENCOUNTER — Encounter: Payer: Self-pay | Admitting: Cardiology

## 2017-08-13 ENCOUNTER — Ambulatory Visit (INDEPENDENT_AMBULATORY_CARE_PROVIDER_SITE_OTHER): Payer: BLUE CROSS/BLUE SHIELD | Admitting: Cardiology

## 2017-08-13 VITALS — BP 100/76 | HR 53 | Resp 16 | Ht 71.5 in | Wt 213.8 lb

## 2017-08-13 DIAGNOSIS — E785 Hyperlipidemia, unspecified: Secondary | ICD-10-CM | POA: Diagnosis not present

## 2017-08-13 DIAGNOSIS — R001 Bradycardia, unspecified: Secondary | ICD-10-CM | POA: Diagnosis not present

## 2017-08-13 DIAGNOSIS — I10 Essential (primary) hypertension: Secondary | ICD-10-CM

## 2017-08-13 DIAGNOSIS — I2510 Atherosclerotic heart disease of native coronary artery without angina pectoris: Secondary | ICD-10-CM | POA: Diagnosis not present

## 2017-08-13 DIAGNOSIS — I2111 ST elevation (STEMI) myocardial infarction involving right coronary artery: Secondary | ICD-10-CM

## 2017-08-13 MED ORDER — AMLODIPINE BESYLATE 5 MG PO TABS: 5.0000 mg | ORAL_TABLET | Freq: Every day | ORAL | 3 refills | Status: DC

## 2017-08-13 NOTE — Patient Instructions (Signed)
Medication Instructions: Your physician recommends that you continue on your current medications as directed. Please refer to the Current Medication list given to you today.  Labwork: Your physician recommends that you return for lab work in 6 WEEKS - FASTING Lipid and Hepatic Function Panel  Procedures/Testing: None Ordered  Follow-Up: Your physician recommends that you schedule a follow-up appointment in: 8 WEEKS with Dr. Clifton JamesMcAlhany  If you need a refill on your cardiac medications before your next appointment, please call your pharmacy.

## 2017-08-13 NOTE — Progress Notes (Signed)
Cardiology Office Note   Date:  08/13/2017   ID:  CRIS TALAVERA, DOB 06-06-1971, MRN [id number]  PCP:  Zola Button, Grayling Congress, DO  Cardiologist:  [facility]    Chief Complaint  Patient presents with  . Hospitalization Follow-up    STEMI      History of Present Illness: Chad Chase is a 46 y.o. male who presents for post hospitalization for STEMI of the inf wall.  Emergent successful PTCA/DES x 2 mid RCA with evidence of residual thrombus in the small caliber distal branches.  Follow up cath 07/23/17 for residual disease of ramus intermediate with Successful PTCA/DES x 1 intermediate branch.  Occlusion of the mRCA stents unable to open the occluded RCA stented segment.. EF by echo 60-65%. Mild MR.  Also with HTN and LHD.    Initally bradycardia but pror to discharge this resolved and Coreg started and titrated to 25 mg BID. Pk troponin 7.59.      NEEDs lipids and hepatic in 6 weeks.  Today is doing well no chest pain or SOB. encouraged to walk beginning with 5 min per day.  He has returned to work short days, he works with soft ware.  He and wife are trying to eat healthy.  He is interested in cardiac rehab.  We reviewed procedure and stents.      Past Medical History:  Diagnosis Date  . Hypertension    10 years ago--treated with medication    Past Surgical History:  Procedure Laterality Date  . CORONARY STENT INTERVENTION N/A 07/23/2017   Procedure: CORONARY STENT INTERVENTION;  Surgeon: Kathleene Hazel, MD;  Location: [facility] INVASIVE [facility];  Service: Cardiovascular;  Laterality: N/A;  . CORONARY/GRAFT ACUTE MI REVASCULARIZATION N/A 07/21/2017   Procedure: Coronary/Graft Acute MI Revascularization;  Surgeon: Kathleene Hazel, MD;  Location: [facility] INVASIVE CV LAB;  Service: Cardiovascular;  Laterality: N/A;  . KNEE ARTHROSCOPY    . LEFT HEART CATH AND CORONARY ANGIOGRAPHY N/A 07/21/2017   Procedure: LEFT HEART CATH AND CORONARY ANGIOGRAPHY;  Surgeon: Kathleene Hazel, MD;  Location: [facility] INVASIVE CV [facility];  Service: Cardiovascular;  Laterality: N/A;  . LEFT HEART CATH AND CORONARY ANGIOGRAPHY N/A 07/23/2017   Procedure: LEFT HEART CATH AND CORONARY ANGIOGRAPHY;  Surgeon: Kathleene Hazel, MD;  Location: [facility] INVASIVE CV LAB;  Service: Cardiovascular;  Laterality: N/A;     Current Outpatient Medications  Medication Sig Dispense Refill  . amLODipine (NORVASC) 5 MG tablet Take 1 tablet (5 mg total) by mouth daily. 30 tablet 0  . aspirin 81 MG chewable tablet Chew 1 tablet (81 mg total) by mouth daily.    Marland Kitchen atorvastatin (LIPITOR) 80 MG tablet Take 1 tablet (80 mg total) by mouth daily at 6 PM. 90 tablet 1  . carvedilol (COREG) 25 MG tablet Take 1 tablet (25 mg total) by mouth 2 (two) times daily with a meal. 60 tablet 2  . ticagrelor (BRILINTA) 90 MG TABS tablet Take 1 tablet (90 mg total) by mouth 2 (two) times daily. 180 tablet 2  . nitroGLYCERIN (NITROSTAT) 0.4 MG SL tablet Place 1 tablet (0.4 mg total) under the tongue every 5 (five) minutes as needed. (Patient not taking: Reported on 07/29/2017) 25 tablet 2   No current facility-administered medications for this visit.     Allergies:   Patient has no known allergies.    Social History:  The patient  reports that  has never smoked. he has never used smokeless tobacco. He  reports that he does not drink alcohol or use drugs.   Family History:  The patient's family history includes Heart failure in his paternal grandmother; Hypertension in his father, maternal grandfather, maternal grandmother, and mother; Prostate cancer in his father.    ROS:  General:no colds or fevers, no weight changes Skin:no rashes or ulcers HEENT:no blurred vision, no congestion CV:see HPI PUL:see HPI GI:no diarrhea constipation or melena, no indigestion GU:no hematuria, no dysuria MS:no joint pain, no claudication Neuro:no syncope, no lightheadedness Endo:no diabetes, no thyroid disease  Wt Readings from  Last 3 Encounters:  08/13/17 213 lb 12.8 oz (97 kg)  07/29/17 215 lb (97.5 kg)  07/21/17 216 lb 0.8 oz (98 kg)     PHYSICAL EXAM: VS:  BP 100/76   Pulse (!) 53   Resp 16   Ht 5' 11.5" (1.816 m)   Wt 213 lb 12.8 oz (97 kg)   SpO2 98%   BMI 29.40 kg/m  , BMI Body mass index is 29.4 kg/m. General:Pleasant affect, NAD Skin:Warm and dry, brisk capillary refill HEENT:normocephalic, sclera clear, mucus membranes moist Neck:supple, no JVD, no bruits  Heart:S1S2 RRR without murmur, gallup, rub or click Lungs:clear without rales, rhonchi, or wheezes ZOX:WRUEAbd:soft, non tender, + BS, do not palpate liver spleen or masses Ext:no lower ext edema, 2+ pedal pulses, 2+ radial pulses Neuro:alert and oriented, MAE, follows commands, + facial symmetry    EKG:  EKG is ordered today. The ekg ordered today demonstrates SB at 48 non specific ST abnormality    Recent Labs: 07/24/2017: BUN 11; Creatinine, Ser 1.19; Hemoglobin 12.6; Platelets 254; Potassium 3.8; Sodium 132    Lipid Panel    Component Value Date/Time   CHOL 265 (H) 07/21/2017 1434   TRIG 270 (H) 07/21/2017 1434   HDL 39 (L) 07/21/2017 1434   CHOLHDL 6.8 07/21/2017 1434   VLDL 54 (H) 07/21/2017 1434   LDLCALC 172 (H) 07/21/2017 1434   LDLDIRECT 221.8 12/07/2011 0814       Other studies Reviewed: Additional studies/ records that were reviewed today include:  Cardiac cath 07/21/17. Conclusion     Mid RCA lesion is 100% stenosed.  Acute Mrg lesion is 50% stenosed.  A drug-eluting stent was successfully placed using a STENT SYNERGY DES 3X24.  Post intervention, there is a 0% residual stenosis.  Dist RCA lesion is 80% stenosed.  A drug-eluting stent was successfully placed using a STENT SYNERGY DES 2.75X24.  Post intervention, there is a 0% residual stenosis.  Prox Cx to Mid Cx lesion is 30% stenosed.  Ramus lesion is 90% stenosed.  Ost Ramus lesion is 30% stenosed.  Ost 1st Diag lesion is 50% stenosed.  Prox  LAD to Mid LAD lesion is 20% stenosed.  The left ventricular systolic function is normal.  LV end diastolic pressure is normal.  The left ventricular ejection fraction is greater than 65% by visual estimate.  There is no mitral valve regurgitation.   1. Acute inferior STEMI secondary to occluded, aneurysmal mid RCA. 2. Successful PTCA/DES x 2 mid RCA with evidence of residual thrombus in the small caliber distal branches.  3. Mild non-obstructive disease in the LAD. Moderate ostial stenosis in the moderate caliber first Diagonal branch.  4. Mild non-obstructive disease in the Circumflex artery.  5. Chase stenosis in the large caliber intermediate branch.  6. Normal LV systolic function  Recommendations: Will admit to ICU and continue Aggrastat drip for 18 hours given the large thrombus burden. Continue ASA/Brilinta/high intensity statin.  No beta blocker given bradycardia. Echo in am. Will plan re-look cath late tomorrow afternoon or on Tuesday and at that time will likely perform PCI on the intermediate branch and assess need for any further intervention on the RCA.     Cardiac cath 07/23/17 Conclusion     Mid RCA to Dist RCA lesion is 100% stenosed.  Ramus lesion is 90% stenosed.  Prox Cx to Mid Cx lesion is 30% stenosed.  Ost 1st Diag lesion is 50% stenosed.  A drug-eluting stent was successfully placed using a STENT SIERRA 2.75 X 18 MM.  Post intervention, there is a 0% residual stenosis.   1. Chase stenosis ramus intermediate branch. Successful PTCA/DES x 1 intermediate branch 2. Occlusion of the mid RCA stents. Unable to open the occluded RCA stented segment.   Recommendations: Continue DAPT with ASA and Brilinta for one year. Continue statin/beta blocker.     ECHO 07/22/17 Study Conclusions  - Left ventricle: The cavity size was normal. There was moderate   concentric hypertrophy. Systolic function was normal. The   estimated ejection fraction was in the  range of 60% to 65%. Wall   motion was normal; there were no regional wall motion   abnormalities. Doppler parameters are consistent with abnormal   left ventricular relaxation (grade 1 diastolic dysfunction). - Aortic valve: Transvalvular velocity was within the normal range.   There was no stenosis. There was mild regurgitation. Valve area   (Vmax): 1.94 cm^2. - Mitral valve: Transvalvular velocity was within the normal range.   There was no evidence for stenosis. There was no regurgitation.   Valve area by pressure half-time: 1.71 cm^2. - Right ventricle: The cavity size was normal. Wall thickness was   normal. Systolic function was normal. - Atrial septum: No defect or patent foramen ovale was identified. - Tricuspid valve: There was no regurgitation. - Pericardium, extracardiac: There was a left pleural effusion.   ASSESSMENT AND PLAN:  1.  STEMI of inf wall.  With stents to RCA and follow up cath with stent to ramus but RCA stents had re- stenosed.  Unable to open.  EF 60-65%  Follow up with Dr. Clifton JamesMcAlhany in 6-8 weeks. Continue DAPT  2.  CAD with closure of stents to RCA.  No further chest pain.  Is beginning to walk some, encouraged cardiac rehab.  Will send note.    3.  HLD on statin continue and recheck hepatic and lipid in 6 weeks  4. HTN controlled  5.  S brady but asymptomatic.  May need to decrease BB if he becomes symptomatic.     Current medicines are reviewed with the patient today.  The patient Has no concerns regarding medicines.  The following changes have been made:  See above Labs/ tests ordered today include:see above  Disposition:   FU:  see above  Signed, Nada BoozerLaura Leylany Nored, NP  08/13/2017 11:52 AM    [facility] [address], [place], [place]  [address]/ [address] [place], [place] Phone: [phone]; Fax: [phone]  [phone]

## 2017-08-14 ENCOUNTER — Telehealth (HOSPITAL_COMMUNITY): Payer: Self-pay

## 2017-08-14 NOTE — Telephone Encounter (Signed)
Called and spoke with patient about Cardiac Rehab - Patient is interested in program. Scheduled orientation on 09/17/2017 at 8:30am. Patient will attend the 8:15am exc class.

## 2017-09-11 ENCOUNTER — Telehealth (HOSPITAL_COMMUNITY): Payer: Self-pay | Admitting: Pharmacist

## 2017-09-12 NOTE — Telephone Encounter (Signed)
Cardiac Rehab Medication Review by a Pharmacist  Does the patient  feel that his/her medications are working for him/her?  yes  Has the patient been experiencing any side effects to the medications prescribed?  Yes--some low blood pressures  Does the patient measure his/her own blood pressure or blood glucose at home?  Yes--checks twice a day  Does the patient have any problems obtaining medications due to transportation or finances?   no  Understanding of regimen: excellent Understanding of indications: excellent Potential of compliance: excellent    Pharmacist comments: Confirmed that I was able to speak with patient's wife about medications. She was able to verify all medications the patient is taking, including doses. She had no concerns other than his blood pressure runs low sometimes, but noted that the physician is aware and they are working on dose adjustments. They check his blood pressure typically twice a week, but have not been checking this week. She had no further questions or concerns.     Blake DivineShannon Sahaana Weitman, Pharm.D. PGY1 Pharmacy Resident 09/12/2017 3:25 PM Main Pharmacy: [phone]

## 2017-09-17 ENCOUNTER — Encounter (HOSPITAL_COMMUNITY)
Admission: RE | Admit: 2017-09-17 | Discharge: 2017-09-17 | Disposition: A | Discharge status: Home / Self Care | Payer: BLUE CROSS/BLUE SHIELD | Source: Ambulatory Visit | Attending: Cardiovascular Disease | Admitting: Cardiovascular Disease

## 2017-09-17 ENCOUNTER — Encounter (HOSPITAL_COMMUNITY): Payer: Self-pay

## 2017-09-17 VITALS — BP 128/84 | HR 57 | Ht 70.75 in | Wt 221.1 lb

## 2017-09-17 DIAGNOSIS — I10 Essential (primary) hypertension: Secondary | ICD-10-CM | POA: Insufficient documentation

## 2017-09-17 DIAGNOSIS — Z79899 Other long term (current) drug therapy: Secondary | ICD-10-CM | POA: Insufficient documentation

## 2017-09-17 DIAGNOSIS — Z7982 Long term (current) use of aspirin: Secondary | ICD-10-CM | POA: Insufficient documentation

## 2017-09-17 DIAGNOSIS — I2119 ST elevation (STEMI) myocardial infarction involving other coronary artery of inferior wall: Secondary | ICD-10-CM | POA: Insufficient documentation

## 2017-09-17 DIAGNOSIS — Z955 Presence of coronary angioplasty implant and graft: Secondary | ICD-10-CM | POA: Diagnosis not present

## 2017-09-17 DIAGNOSIS — I2111 ST elevation (STEMI) myocardial infarction involving right coronary artery: Secondary | ICD-10-CM

## 2017-09-17 NOTE — Progress Notes (Signed)
Cardiac Individual Treatment Plan  Patient Details  Name: Chad Chase MRN: [id number] Date of Birth: 10/04/1970 Referring Provider:     CARDIAC REHAB PHASE II ORIENTATION from 09/17/2017 in [facility] CARDIAC REHAB  Referring Provider  Verne Carrow, MD.      Initial Encounter Date:    CARDIAC REHAB PHASE II ORIENTATION from 09/17/2017 in [facility] CARDIAC REHAB  Date  09/17/17  Referring Provider  Verne Carrow, MD.      Visit Diagnosis: ST elevation myocardial infarction involving right coronary artery [facility])  Status post insertion of drug eluting coronary artery stent  Patient's Home Medications on Admission:  Current Outpatient Medications:  .  amLODipine (NORVASC) 5 MG tablet, Take 1 tablet (5 mg total) by mouth daily., Disp: 30 tablet, Rfl: 3 .  aspirin 81 MG chewable tablet, Chew 1 tablet (81 mg total) by mouth daily., Disp: , Rfl:  .  atorvastatin (LIPITOR) 80 MG tablet, Take 1 tablet (80 mg total) by mouth daily at 6 PM., Disp: 90 tablet, Rfl: 1 .  carvedilol (COREG) 25 MG tablet, Take 1 tablet (25 mg total) by mouth 2 (two) times daily with a meal., Disp: 60 tablet, Rfl: 2 .  nitroGLYCERIN (NITROSTAT) 0.4 MG SL tablet, Place 1 tablet (0.4 mg total) under the tongue every 5 (five) minutes as needed., Disp: 25 tablet, Rfl: 2 .  ticagrelor (BRILINTA) 90 MG TABS tablet, Take 1 tablet (90 mg total) by mouth 2 (two) times daily., Disp: 180 tablet, Rfl: 2  Past Medical History: Past Medical History:  Diagnosis Date  . Hypertension    10 years ago--treated with medication    Tobacco Use: Social History   Tobacco Use  Smoking Status Never Smoker  Smokeless Tobacco Never Used    Labs: Recent Review Advice worker    Labs for ITP Cardiac and Pulmonary Rehab Latest Ref Rng & Units 12/07/2011 06/21/2016 07/21/2017   Cholestrol 0 - 200 mg/dL 301(S) 010(X) 323(F)   LDLCALC 0 - 99 mg/dL - 573(U) 202(R)   LDLDIRECT mg/dL  427.0 - -   HDL >62 mg/dL 37.62 83.15 17(O)   Trlycerides <150 mg/dL 160.7 371.0 626(R)      Capillary Blood Glucose: No results found for: GLUCAP   Exercise Target Goals: Date: 09/17/17  Exercise Program Goal: Individual exercise prescription set using results from initial 6 min walk test and THRR while considering  patient's activity barriers and safety.   Exercise Prescription Goal: Initial exercise prescription builds to 30-45 minutes a day of aerobic activity, 2-3 days per week.  Home exercise guidelines will be given to patient during program as part of exercise prescription that the participant will acknowledge.  Activity Barriers & Risk Stratification: Activity Barriers & Cardiac Risk Stratification - 09/17/17 0851      Activity Barriers & Cardiac Risk Stratification   Activity Barriers  None    Cardiac Risk Stratification  High       6 Minute Walk: 6 Minute Walk    Row Name 09/17/17 1137         6 Minute Walk   Phase  Initial     Distance  1930 feet     Walk Time  6 minutes     # of Rest Breaks  0     MPH  3.66     METS  4.94     RPE  11     VO2 Peak  17.29     Symptoms  No     Resting HR  57 bpm     Resting BP  128/84     Resting Oxygen Saturation   99 %     Exercise Oxygen Saturation  during 6 min walk  98 %     Max Ex. HR  93 bpm     Max Ex. BP  120/80     2 Minute Post BP  90/54 100/54 recheck BP        Oxygen Initial Assessment:   Oxygen Re-Evaluation:   Oxygen Discharge (Final Oxygen Re-Evaluation):   Initial Exercise Prescription: Initial Exercise Prescription - 09/17/17 1100      Date of Initial Exercise RX and Referring Provider   Date  09/17/17    Referring Provider  Verne Carrow, MD.      Treadmill   MPH  3    Grade  1    Minutes  10    METs  3.71      Bike   Level  1.8    Minutes  10    METs  4.4      NuStep   Level  4    SPM  85    Minutes  10    METs  3.7      Prescription Details   Frequency  (times per week)  3    Duration  Progress to 30 minutes of continuous aerobic without signs/symptoms of physical distress      Intensity   THRR 40-80% of Max Heartrate  69-138    Ratings of Perceived Exertion  11-13    Perceived Dyspnea  0-4      Progression   Progression  Continue to progress workloads to maintain intensity without signs/symptoms of physical distress.      Resistance Training   Training Prescription  Yes    Weight  5lbs    Reps  10-15       Perform Capillary Blood Glucose checks as needed.  Exercise Prescription Changes:   Exercise Comments:   Exercise Goals and Review: Exercise Goals    Row Name 09/17/17 1140             Exercise Goals   Increase Physical Activity  Yes       Intervention  Provide advice, education, support and counseling about physical activity/exercise needs.;Develop an individualized exercise prescription for aerobic and resistive training based on initial evaluation findings, risk stratification, comorbidities and participant's personal goals.       Expected Outcomes  Short Term: Attend rehab on a regular basis to increase amount of physical activity.;Long Term: Exercising regularly at least 3-5 days a week.;Long Term: Add in home exercise to make exercise part of routine and to increase amount of physical activity.       Increase Strength and Stamina  Yes       Intervention  Provide advice, education, support and counseling about physical activity/exercise needs.;Develop an individualized exercise prescription for aerobic and resistive training based on initial evaluation findings, risk stratification, comorbidities and participant's personal goals.       Expected Outcomes  Short Term: Increase workloads from initial exercise prescription for resistance, speed, and METs.;Short Term: Perform resistance training exercises routinely during rehab and add in resistance training at home;Long Term: Improve cardiorespiratory fitness, muscular  endurance and strength as measured by increased METs and functional capacity ( )       Able to understand and use rate of perceived exertion (RPE) scale  Yes  Intervention  Provide education and explanation on how to use RPE scale       Expected Outcomes  Short Term: Able to use RPE daily in rehab to express subjective intensity level;Long Term:  Able to use RPE to guide intensity level when exercising independently       Able to understand and use Dyspnea scale  Yes       Intervention  Provide education and explanation on how to use Dyspnea scale       Expected Outcomes  Short Term: Able to use Dyspnea scale daily in rehab to express subjective sense of shortness of breath during exertion;Long Term: Able to use Dyspnea scale to guide intensity level when exercising independently       Knowledge and understanding of Target Heart Rate Range (THRR)  Yes       Intervention  Provide education and explanation of THRR including how the numbers were predicted and where they are located for reference       Expected Outcomes  Short Term: Able to state/look up THRR;Long Term: Able to use THRR to govern intensity when exercising independently;Short Term: Able to use daily as guideline for intensity in rehab       Able to check pulse independently  Yes       Intervention  Provide education and demonstration on how to check pulse in carotid and radial arteries.;Review the importance of being able to check your own pulse for safety during independent exercise       Expected Outcomes  Short Term: Able to explain why pulse checking is important during independent exercise;Long Term: Able to check pulse independently and accurately       Understanding of Exercise Prescription  Yes       Intervention  Provide education, explanation, and written materials on patient's individual exercise prescription       Expected Outcomes  Short Term: Able to explain program exercise prescription;Long Term: Able to explain  home exercise prescription to exercise independently          Exercise Goals Re-Evaluation :    Discharge Exercise Prescription (Final Exercise Prescription Changes):   Nutrition:  Target Goals: Understanding of nutrition guidelines, daily intake of sodium 1500mg , cholesterol 200mg , calories 30% from fat and 7% or less from saturated fats, daily to have 5 or more servings of fruits and vegetables.  Biometrics: Pre Biometrics - 09/17/17 0851      Pre Biometrics   Height  5' 10.75" (1.797 m)    Weight  221 lb 1.9 oz (100.3 kg)    Waist Circumference  38.5 inches    Hip Circumference  44 inches    Waist to Hip Ratio  0.88 %    BMI (Calculated)  31.06    Triceps Skinfold  13 mm    % Body Fat  26.4 %    Grip Strength  51.5 kg    Flexibility  12 in    Single Leg Stand  30 seconds        Nutrition Therapy Plan and Nutrition Goals:   Nutrition Assessments:   Nutrition Goals Re-Evaluation:   Nutrition Goals Re-Evaluation:   Nutrition Goals Discharge (Final Nutrition Goals Re-Evaluation):   Psychosocial: Target Goals: Acknowledge presence or absence of significant depression and/or stress, maximize coping skills, provide positive support system. Participant is able to verbalize types and ability to use techniques and skills needed for reducing stress and depression.  Initial Review & Psychosocial Screening: Initial Psych Review & Screening -  09/17/17 1201      Initial Review   Current issues with  None Identified      Family Dynamics   Good Support System?  Yes spouse, family    Comments  no psychosocial needs identified, no interventions necessary       Barriers   Psychosocial barriers to participate in program  There are no identifiable barriers or psychosocial needs.      Screening Interventions   Interventions  Encouraged to exercise;Provide feedback about the scores to participant    Expected Outcomes  Short Term goal: Utilizing psychosocial counselor,  staff and physician to assist with identification of specific Stressors or current issues interfering with healing process. Setting desired goal for each stressor or current issue identified.;Long Term Goal: Stressors or current issues are controlled or eliminated.       Quality of Life Scores: Quality of Life - 09/17/17 1202      Quality of Life Scores   GLOBAL Pre  -- pt plans to return pre QOL questionnaire at first group exercise session      Scores of 19 and below usually indicate a poorer quality of life in these areas.  A difference of  2-3 points is a clinically meaningful difference.  A difference of 2-3 points in the total score of the Quality of Life Index has been associated with significant improvement in overall quality of life, self-image, physical symptoms, and general health in studies assessing change in quality of life.  PHQ-9: Recent Review Flowsheet Data    Depression screen [facility] 2/9 07/29/2017   Decreased Interest 0   Down, Depressed, Hopeless 0   PHQ - 2 Score 0     Interpretation of Total Score  Total Score Depression Severity:  1-4 = Minimal depression, 5-9 = Mild depression, 10-14 = Moderate depression, 15-19 = Moderately severe depression, 20-27 = Severe depression   Psychosocial Evaluation and Intervention:   Psychosocial Re-Evaluation:   Psychosocial Discharge (Final Psychosocial Re-Evaluation):   Vocational Rehabilitation: Provide vocational rehab assistance to qualifying candidates.   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 Participant will state understanding/return demonstration of topics presented.  Learning Barriers/Preferences: Learning  Barriers/Preferences - 09/17/17 1143      Learning Barriers/Preferences   Learning Barriers  None    Learning Preferences  None       Education Topics: Count Your Pulse:  -Group instruction provided by verbal instruction, demonstration, patient participation and written materials to support subject.  Instructors address importance of being able to find your pulse and how to count your pulse when at home without a heart monitor.  Patients get hands on experience counting their pulse with staff help and individually.   Heart Attack, Angina, and Risk Factor Modification:  -Group instruction provided by verbal instruction, video, and written materials to support subject.  Instructors address signs and symptoms of angina and heart attacks.    Also discuss risk factors for heart disease and how to make changes to improve heart health risk factors.   Functional Fitness:  -Group instruction provided by verbal instruction, demonstration, patient participation, and written materials to support subject.  Instructors address safety measures for doing things around the house.  Discuss how to get  up and down off the floor, how to pick things up properly, how to safely get out of a chair without assistance, and balance training.   Meditation and Mindfulness:  -Group instruction provided by verbal instruction, patient participation, and written materials to support subject.  Instructor addresses importance of mindfulness and meditation practice to help reduce stress and improve awareness.  Instructor also leads participants through a meditation exercise.    Stretching for Flexibility and Mobility:  -Group instruction provided by verbal instruction, patient participation, and written materials to support subject.  Instructors lead participants through series of stretches that are designed to increase flexibility thus improving mobility.  These stretches are additional exercise for major muscle groups that  are typically performed during regular warm up and cool down.   Hands Only CPR:  -Group verbal, video, and participation provides a basic overview of AHA guidelines for community CPR. Role-play of emergencies allow participants the opportunity to practice calling for help and chest compression technique with discussion of AED use.   Hypertension: -Group verbal and written instruction that provides a basic overview of hypertension including the most recent diagnostic guidelines, risk factor reduction with self-care instructions and medication management.    Nutrition I class: Heart Healthy Eating:  -Group instruction provided by PowerPoint slides, verbal discussion, and written materials to support subject matter. The instructor gives an explanation and review of the Therapeutic Lifestyle Changes diet recommendations, which includes a discussion on lipid goals, dietary fat, sodium, fiber, plant stanol/sterol esters, sugar, and the components of a well-balanced, healthy diet.   Nutrition II class: Lifestyle Skills:  -Group instruction provided by PowerPoint slides, verbal discussion, and written materials to support subject matter. The instructor gives an explanation and review of label reading, grocery shopping for heart health, heart healthy recipe modifications, and ways to make healthier choices when eating out.   Diabetes Question & Answer:  -Group instruction provided by PowerPoint slides, verbal discussion, and written materials to support subject matter. The instructor gives an explanation and review of diabetes co-morbidities, pre- and post-prandial blood glucose goals, pre-exercise blood glucose goals, signs, symptoms, and treatment of hypoglycemia and hyperglycemia, and foot care basics.   Diabetes Blitz:  -Group instruction provided by PowerPoint slides, verbal discussion, and written materials to support subject matter. The instructor gives an explanation and review of the  physiology behind type 1 and type 2 diabetes, diabetes medications and rational behind using different medications, pre- and post-prandial blood glucose recommendations and Hemoglobin A1c goals, diabetes diet, and exercise including blood glucose guidelines for exercising safely.    Portion Distortion:  -Group instruction provided by PowerPoint slides, verbal discussion, written materials, and food models to support subject matter. The instructor gives an explanation of serving size versus portion size, changes in portions sizes over the last 20 years, and what consists of a serving from each food group.   Stress Management:  -Group instruction provided by verbal instruction, video, and written materials to support subject matter.  Instructors review role of stress in heart disease and how to cope with stress positively.     Exercising on Your Own:  -Group instruction provided by verbal instruction, power point, and written materials to support subject.  Instructors discuss benefits of exercise, components of exercise, frequency and intensity of exercise, and end points for exercise.  Also discuss use of nitroglycerin and activating EMS.  Review options of places to exercise outside of rehab.  Review guidelines for sex with heart disease.   Cardiac Drugs I:  -  Group instruction provided by verbal instruction and written materials to support subject.  Instructor reviews cardiac drug classes: antiplatelets, anticoagulants, beta blockers, and statins.  Instructor discusses reasons, side effects, and lifestyle considerations for each drug class.   Cardiac Drugs II:  -Group instruction provided by verbal instruction and written materials to support subject.  Instructor reviews cardiac drug classes: angiotensin converting enzyme inhibitors (ACE-I), angiotensin II receptor blockers (ARBs), nitrates, and calcium channel blockers.  Instructor discusses reasons, side effects, and lifestyle considerations  for each drug class.   Anatomy and Physiology of the Circulatory System:  Group verbal and written instruction and models provide basic cardiac anatomy and physiology, with the coronary electrical and arterial systems. Review of: AMI, Angina, Valve disease, Heart Failure, Peripheral Artery Disease, Cardiac Arrhythmia, Pacemakers, and the ICD.   Other Education:  -Group or individual verbal, written, or video instructions that support the educational goals of the cardiac rehab program.   Knowledge Questionnaire Score: Knowledge Questionnaire Score - 09/17/17 1143      Knowledge Questionnaire Score   Pre Score  15/24       Core Components/Risk Factors/Patient Goals at Admission: Personal Goals and Risk Factors at Admission - 09/17/17 1157      Core Components/Risk Factors/Patient Goals on Admission    Weight Management  --       Core Components/Risk Factors/Patient Goals Review:    Core Components/Risk Factors/Patient Goals at Discharge (Final Review):    ITP Comments: ITP Comments    Row Name 09/17/17 1134           ITP Comments  Medical Director- Dr. Armanda Magic, MD.          Comments:  Patient attended orientation from [phone] to 0957 am.  to review rules and guidelines for program. Completed 6 minute walk test, Intitial ITP, and exercise prescription.  VS stable with exception of post exercise. Pt BP:  90/50, HR 45 during cool down period.  Pt asymptomatic. Pt admits he did not eat breakfast. Pt given peanut butter crackers and gatorade. Repeat BP:  100/54. Dr. Clifton James made aware.  Telemetry-sinus rhythm.  Asymptomatic. Deveron Furlong, RN, BSN Cardiac Pulmonary Rehab 09/17/17  12:13 PM

## 2017-09-18 ENCOUNTER — Telehealth (HOSPITAL_COMMUNITY): Payer: Self-pay | Admitting: Cardiac Rehabilitation

## 2017-09-18 MED ORDER — CARVEDILOL 12.5 MG PO TABS: 12.5000 mg | ORAL_TABLET | Freq: Two times a day (BID) | ORAL | 6 refills | Status: DC

## 2017-09-18 NOTE — Telephone Encounter (Signed)
I spoke with pt's wife and gave her instructions from Dr. Clifton JamesMcAlhany.  Will send prescription for lower dose [facility] [place].

## 2017-09-18 NOTE — Telephone Encounter (Signed)
-----   Message from Kathleene Hazelhristopher D McAlhany, MD sent at 09/18/2017 10:02 AM EST ----- Regarding: RE: cardiac  rehab  Pat, Can we call him and lower his Coreg down to 12.5 mg po BID and have him hold his Norvasc for now? Thanks, chris  ----- Message ----- From: Robyne Peersion, Joann H, RN Sent: 09/17/2017   3:34 PM To: Kathleene Hazelhristopher D McAlhany, MD Subject: cardiac  rehab                                 Dear Dr. Clifton JamesMcAlhany,  Pt had 6 minute walk test today for cardiac rehab orientation. Pt BP: 90/50 HR-45 post exercise. Pt asymptomatic. Pt had not eaten this am. Pt given crackers and gatorade. Recheck BP:  100/54, HR- 50.  He starts group exercise Monday.   Please provide appropriate parameters for MD notification and when to hold exercise.  He is taking carvedilol 25mg  BID and amlodipine 5mg  daily.   He is schedule to see you in follow up 10/02/2017.    Thank you, Deveron FurlongJoann Rion, RN, BSN Cardiac Pulmonary Rehab

## 2017-09-23 ENCOUNTER — Telehealth (HOSPITAL_COMMUNITY): Payer: Self-pay | Admitting: *Deleted

## 2017-09-23 ENCOUNTER — Ambulatory Visit (HOSPITAL_COMMUNITY): Payer: BLUE CROSS/BLUE SHIELD

## 2017-09-23 ENCOUNTER — Other Ambulatory Visit: Payer: BLUE CROSS/BLUE SHIELD | Admitting: *Deleted

## 2017-09-23 DIAGNOSIS — I10 Essential (primary) hypertension: Secondary | ICD-10-CM

## 2017-09-23 DIAGNOSIS — I2510 Atherosclerotic heart disease of native coronary artery without angina pectoris: Secondary | ICD-10-CM

## 2017-09-23 DIAGNOSIS — E785 Hyperlipidemia, unspecified: Secondary | ICD-10-CM | POA: Diagnosis not present

## 2017-09-23 LAB — LIPID PANEL
CHOL/HDL RATIO: 3.7 ratio (ref 0.0–5.0)
CHOLESTEROL TOTAL: 133 mg/dL (ref 100–199)
HDL: 36 mg/dL — ABNORMAL LOW (ref >39)
LDL CALC: 83 mg/dL (ref 0–99)
Triglycerides: 72 mg/dL (ref 0–149)
VLDL Cholesterol Cal: 14 mg/dL (ref 5–40)

## 2017-09-23 LAB — HEPATIC FUNCTION PANEL
ALT: 21 IU/L (ref 0–44)
AST: 25 IU/L (ref 0–40)
Albumin: 4.2 g/dL (ref 3.5–5.5)
Alkaline Phosphatase: 82 IU/L (ref 39–117)
BILIRUBIN TOTAL: 0.4 mg/dL (ref 0.0–1.2)
Bilirubin, Direct: 0.11 mg/dL (ref 0.00–0.40)
Total Protein: 7.1 g/dL (ref 6.0–8.5)

## 2017-09-23 NOTE — Progress Notes (Signed)
Chad ConnorsBarry L Chase 47 y.o. male DOB 07/16/1971 MRN [id number]       Nutrition  1. ST elevation myocardial infarction involving right coronary artery (HCC)   2. Status post insertion of drug eluting coronary artery stent    Past Medical History:  Diagnosis Date  . Hypertension    10 years ago--treated with medication   Meds reviewed.   HT: Ht Readings from Last 1 Encounters:  09/17/17 5' 10.75" (1.797 m)    WT: Wt Readings from Last 3 Encounters:  09/17/17 221 lb 1.9 oz (100.3 kg)  08/13/17 213 lb 12.8 oz (97 kg)  07/29/17 215 lb (97.5 kg)     BMI 31.1   Current tobacco use? No  Labs:  Lipid Panel     Component Value Date/Time   CHOL 265 (H) 07/21/2017 1434   TRIG 270 (H) 07/21/2017 1434   HDL 39 (L) 07/21/2017 1434   CHOLHDL 6.8 07/21/2017 1434   VLDL 54 (H) 07/21/2017 1434   LDLCALC 172 (H) 07/21/2017 1434   LDLDIRECT 221.8 12/07/2011 0814    No results found for: HGBA1C CBG (last 3)  No results for input(s): GLUCAP in the last 72 hours.  Nutrition Diagnosis ? Food-and nutrition-related knowledge deficit related to lack of exposure to information as related to diagnosis of: ? CVD ? Obesity related to excessive energy intake as evidenced by a BMI of 31.1  Nutrition Goal(s):  ? Pt to identify food quantities necessary to achieve weight loss of 6-24 lb (2.7-10.9 kg) at graduation from cardiac rehab.   Plan:  Pt to attend nutrition classes ? Nutrition I ? Nutrition II ? Portion Distortion  Will provide client-centered nutrition education as part of interdisciplinary care.   Monitor and evaluate progress toward nutrition goal with team.  Chad PlumbEdna Alexsandra Chase, M.Ed, RD, LDN, CDE 09/23/2017 11:15 AM

## 2017-09-24 ENCOUNTER — Encounter: Payer: Self-pay | Admitting: Family Medicine

## 2017-09-24 ENCOUNTER — Other Ambulatory Visit: Payer: Self-pay | Admitting: *Deleted

## 2017-09-24 ENCOUNTER — Ambulatory Visit (INDEPENDENT_AMBULATORY_CARE_PROVIDER_SITE_OTHER): Payer: BLUE CROSS/BLUE SHIELD | Admitting: Family Medicine

## 2017-09-24 VITALS — BP 126/86 | HR 65 | Temp 98.3°F | Resp 16 | Ht 72.0 in | Wt 220.2 lb

## 2017-09-24 DIAGNOSIS — I10 Essential (primary) hypertension: Secondary | ICD-10-CM | POA: Diagnosis not present

## 2017-09-24 DIAGNOSIS — E785 Hyperlipidemia, unspecified: Secondary | ICD-10-CM

## 2017-09-24 DIAGNOSIS — J069 Acute upper respiratory infection, unspecified: Secondary | ICD-10-CM | POA: Insufficient documentation

## 2017-09-24 DIAGNOSIS — R739 Hyperglycemia, unspecified: Secondary | ICD-10-CM

## 2017-09-24 LAB — BASIC METABOLIC PANEL
BUN: 11 mg/dL (ref 6–23)
CALCIUM: 9.4 mg/dL (ref 8.4–10.5)
CO2: 30 meq/L (ref 19–32)
Chloride: 103 mEq/L (ref 96–112)
Creatinine, Ser: 1.26 mg/dL (ref 0.40–1.50)
GFR: 78.74 mL/min (ref >60.00)
GLUCOSE: 140 mg/dL — AB (ref 70–99)
Potassium: 4.5 mEq/L (ref 3.5–5.1)
SODIUM: 140 meq/L (ref 135–145)

## 2017-09-24 MED ORDER — FLUTICASONE PROPIONATE 50 MCG/ACT NA SUSP: 2.0000 | Freq: Every day | NASAL | 6 refills | Status: DC

## 2017-09-24 NOTE — Progress Notes (Signed)
Patient ID: Chad Chase, male   DOB: 1970/08/29, 47 y.o.   MRN: [id number]    Subjective:  I acted as a Neurosurgeon for Dr. Zola Button.  Apolonio Schneiders, CMA   Patient ID: Chad Chase, male    DOB: 16-Jan-1971, 47 y.o.   MRN: [id number]  Chief Complaint  Patient presents with  . Hypertension  . Hyperlipidemia    HPI  Patient is in today for follow up cholesterol and blood pressure.  He is doing well on current treatment. Pt c/o uri symptoms-- stuffy nose / runny nose, pnd.   Patient Care Team: Zola Button, Grayling Congress, DO as PCP - General (Family Medicine)   Past Medical History:  Diagnosis Date  . Hypertension    10 years ago--treated with medication    Past Surgical History:  Procedure Laterality Date  . CORONARY STENT INTERVENTION N/A 07/23/2017   Procedure: CORONARY STENT INTERVENTION;  Surgeon: Kathleene Hazel, MD;  [facility]: [facility];  Service: Cardiovascular;  Laterality: N/A;  . CORONARY/GRAFT ACUTE MI REVASCULARIZATION N/A 07/21/2017   Procedure: Coronary/Graft Acute MI Revascularization;  Surgeon: Kathleene Hazel, MD;  Location: [facility];  Service: Cardiovascular;  Laterality: N/A;  . KNEE ARTHROSCOPY    . LEFT HEART CATH AND CORONARY ANGIOGRAPHY N/A 07/21/2017   Procedure: LEFT HEART CATH AND CORONARY ANGIOGRAPHY;  Surgeon: Kathleene Hazel, MD;  [facility]: [facility];  Service: Cardiovascular;  Laterality: N/A;  . LEFT HEART CATH AND CORONARY ANGIOGRAPHY N/A 07/23/2017   Procedure: LEFT HEART CATH AND CORONARY ANGIOGRAPHY;  Surgeon: Kathleene Hazel, MD;  [facility]: [facility];  Service: Cardiovascular;  Laterality: N/A;    Family History  Problem Relation Age of Onset  . Hypertension Mother   . Prostate cancer Father   . Hypertension Father   . Hypertension Maternal Grandmother   . Hypertension Maternal Grandfather   . Heart failure Paternal Grandmother     Social History   Socioeconomic History  .  Marital status: Married    Spouse name: Not on file  . Number of children: Not on file  . Years of education: Not on file  . Highest education level: Not on file  Social Needs  . Financial resource strain: Not on file  . Food insecurity - worry: Not on file  . Food insecurity - inability: Not on file  . Transportation needs - medical: Not on file  . Transportation needs - non-medical: Not on file  Occupational History  . 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 Smoking status: Never Smoker  . Smokeless tobacco: Never Used  Substance and Sexual Activity  . Alcohol use: No  . Drug use: No  . Sexual activity: Yes    Partners: Female  Other Topics Concern  . Not on file  Social History Narrative  . Not on file    Outpatient Medications Prior to Visit  Medication Sig Dispense Refill  . aspirin 81 MG chewable tablet Chew 1 tablet (81 mg total) by mouth daily.    Marland Kitchen atorvastatin (LIPITOR) 80 MG tablet Take 1 tablet (80 mg total) by mouth daily at 6 PM. 90 tablet 1  . carvedilol (COREG) 12.5 MG tablet Take 1 tablet (12.5 mg total) by mouth 2 (two) times daily with a meal. 60 tablet 6  . nitroGLYCERIN (NITROSTAT) 0.4 MG SL tablet Place 1 tablet (0.4 mg total) under the tongue every 5 (five) minutes as needed.  25 tablet 2  . ticagrelor (BRILINTA) 90 MG TABS tablet Take 1 tablet (90 mg total) by mouth 2 (two) times daily. 180 tablet 2   No facility-administered medications prior to visit.     No Known Allergies  Review of Systems  Constitutional: Negative for fever and malaise/fatigue.  HENT: Positive for congestion. Negative for sinus pain and sore throat.   Eyes: Negative for blurred vision.  Respiratory: Negative for cough and shortness of breath.   Cardiovascular: Negative for chest pain, palpitations and leg swelling.  Gastrointestinal: Negative for vomiting.  Musculoskeletal: Negative for back pain.  Skin: Negative for rash.  Neurological: Negative  for loss of consciousness and headaches.       Objective:    Physical Exam  Constitutional: He is oriented to person, place, and time. He appears well-developed and well-nourished.  HENT:  Right Ear: External ear normal.  Left Ear: External ear normal.  Nose: Mucosal edema and rhinorrhea present. Right sinus exhibits no maxillary sinus tenderness and no frontal sinus tenderness. Left sinus exhibits no maxillary sinus tenderness and no frontal sinus tenderness.  Mouth/Throat: Posterior oropharyngeal erythema present.  + PND + errythema  Eyes: Conjunctivae are normal. Right eye exhibits no discharge. Left eye exhibits no discharge.  Cardiovascular: Normal rate, regular rhythm and normal heart sounds.  No murmur heard. Pulmonary/Chest: Effort normal and breath sounds normal. No respiratory distress. He has no wheezes. He has no rales. He exhibits no tenderness.  Musculoskeletal: He exhibits no edema.  Lymphadenopathy:    He has no cervical adenopathy.  Neurological: He is alert and oriented to person, place, and time.  Nursing note and vitals reviewed.   BP 126/86 (Cuff Size: Normal)   Pulse 65   Temp 98.3 F (36.8 C) (Oral)   Resp 16   Ht 6' (1.829 m)   Wt 220 lb 3.2 oz (99.9 kg)   SpO2 97%   BMI 29.86 kg/m  Wt Readings from Last 3 Encounters:  09/24/17 220 lb 3.2 oz (99.9 kg)  09/17/17 221 lb 1.9 oz (100.3 kg)  08/13/17 213 lb 12.8 oz (97 kg)   BP Readings from Last 3 Encounters:  09/24/17 126/86  09/17/17 128/84  08/13/17 100/76     Immunization History  Administered Date(s) Administered  . Tdap 11/30/2011    Health Maintenance  Topic Date Due  . HIV Screening  04/08/1985  . INFLUENZA VACCINE  05/26/2018 (Originally 03/27/2017)  . TETANUS/TDAP  11/29/2021    Lab Results  Component Value Date   WBC 14.6 (H) 07/24/2017   HGB 12.6 (L) 07/24/2017   HCT 38.0 (L) 07/24/2017   PLT 254 07/24/2017   GLUCOSE 112 (H) 07/24/2017   CHOL 133 09/23/2017   TRIG 72  09/23/2017   HDL 36 (L) 09/23/2017   LDLDIRECT 221.8 12/07/2011   LDLCALC 83 09/23/2017   ALT 21 09/23/2017   AST 25 09/23/2017   NA 132 (L) 07/24/2017   K 3.8 07/24/2017   CL 99 (L) 07/24/2017   CREATININE 1.19 07/24/2017   BUN 11 07/24/2017   CO2 23 07/24/2017   TSH 1.22 06/21/2016   PSA 0.38 06/21/2016   INR 0.94 07/22/2017    Lab Results  Component Value Date   TSH 1.22 06/21/2016   Lab Results  Component Value Date   WBC 14.6 (H) 07/24/2017   HGB 12.6 (L) 07/24/2017   HCT 38.0 (L) 07/24/2017   MCV 87.2 07/24/2017   PLT 254 07/24/2017   Lab Results  Component  Value Date   NA 132 (L) 07/24/2017   K 3.8 07/24/2017   CO2 23 07/24/2017   GLUCOSE 112 (H) 07/24/2017   BUN 11 07/24/2017   CREATININE 1.19 07/24/2017   BILITOT 0.4 09/23/2017   ALKPHOS 82 09/23/2017   AST 25 09/23/2017   ALT 21 09/23/2017   PROT 7.1 09/23/2017   ALBUMIN 4.2 09/23/2017   CALCIUM 9.0 07/24/2017   ANIONGAP 10 07/24/2017   GFR 72.49 06/21/2016   Lab Results  Component Value Date   CHOL 133 09/23/2017   Lab Results  Component Value Date   HDL 36 (L) 09/23/2017   Lab Results  Component Value Date   LDLCALC 83 09/23/2017   Lab Results  Component Value Date   TRIG 72 09/23/2017   Lab Results  Component Value Date   CHOLHDL 3.7 09/23/2017   No results found for: HGBA1C       Assessment & Plan:   Problem List Items Addressed This Visit      Unprioritized   Hyperlipidemia    Tolerating statin, encouraged heart healthy diet, avoid trans fats, minimize simple carbs and saturated fats. Increase exercise as tolerated      Hypertension    Well controlled, no changes to meds. Encouraged heart healthy diet such as the DASH diet and exercise as tolerated.       Relevant Orders   Basic metabolic panel   Viral upper respiratory tract infection - Primary    Flonase,  Can use antihistamine ie claritin, allegra, zyrtec  otc  Do not use decongestants       Relevant  Medications   fluticasone (FLONASE) 50 MCG/ACT nasal spray      I am having Minor L. Bohnet start on fluticasone. I am also having him maintain his aspirin, atorvastatin, ticagrelor, nitroGLYCERIN, and carvedilol.  Meds ordered this encounter  Medications  . fluticasone (FLONASE) 50 MCG/ACT nasal spray    Sig: Place 2 sprays into both nostrils daily.    Dispense:  16 g    Refill:  6    CMA served as scribe during this visit. History, Physical and Plan performed by medical provider. Documentation and orders reviewed and attested to.  Donato SchultzYvonne R Lowne Chase, DO

## 2017-09-24 NOTE — Assessment & Plan Note (Signed)
Tolerating statin, encouraged heart healthy diet, avoid trans fats, minimize simple carbs and saturated fats. Increase exercise as tolerated 

## 2017-09-24 NOTE — Assessment & Plan Note (Signed)
Flonase,  Can use antihistamine ie claritin, allegra, zyrtec  otc  Do not use decongestants

## 2017-09-24 NOTE — Patient Instructions (Signed)

## 2017-09-24 NOTE — Assessment & Plan Note (Signed)
Well controlled, no changes to meds. Encouraged heart healthy diet such as the DASH diet and exercise as tolerated.  °

## 2017-09-25 ENCOUNTER — Ambulatory Visit (HOSPITAL_COMMUNITY): Payer: BLUE CROSS/BLUE SHIELD

## 2017-09-27 ENCOUNTER — Ambulatory Visit (HOSPITAL_COMMUNITY): Payer: BLUE CROSS/BLUE SHIELD

## 2017-09-30 ENCOUNTER — Ambulatory Visit (HOSPITAL_COMMUNITY): Payer: BLUE CROSS/BLUE SHIELD

## 2017-10-02 ENCOUNTER — Ambulatory Visit (HOSPITAL_COMMUNITY): Payer: BLUE CROSS/BLUE SHIELD

## 2017-10-02 ENCOUNTER — Encounter: Payer: Self-pay | Admitting: Cardiovascular Disease

## 2017-10-02 ENCOUNTER — Ambulatory Visit (INDEPENDENT_AMBULATORY_CARE_PROVIDER_SITE_OTHER): Payer: BLUE CROSS/BLUE SHIELD | Admitting: Cardiovascular Disease

## 2017-10-02 VITALS — BP 154/97 | HR 49 | Ht 72.0 in | Wt 223.8 lb

## 2017-10-02 DIAGNOSIS — I10 Essential (primary) hypertension: Secondary | ICD-10-CM | POA: Diagnosis not present

## 2017-10-02 DIAGNOSIS — E785 Hyperlipidemia, unspecified: Secondary | ICD-10-CM

## 2017-10-02 DIAGNOSIS — I2510 Atherosclerotic heart disease of native coronary artery without angina pectoris: Secondary | ICD-10-CM | POA: Diagnosis not present

## 2017-10-02 NOTE — Patient Instructions (Signed)

## 2017-10-02 NOTE — Progress Notes (Signed)
Chief Complaint  Patient presents with  . Coronary Artery Disease    History of Present Illness: 47 yo male with history of CAD and HTN who presents to clinic today for cardiac follow up. He was admitted to [facility] November 2018 with an inferior STEMI secondary to an occluded RCA. Two drug eluting stents were placed in the RCA. Staged PCI of the intermediate branch with placement of a drug eluting stent. During the staged PCI, he was found to have occlusion of the mid RCA stents, likely due to stent thrombosis from high thrombus burden and poor distal runoff due to embolization. Echo November 2018 with LVEF=60-65%, mild MR. He did well following his MI. He has been in cardiac rehab. BP and HR low in January 2019 at rehab so Coreg dose was lowered and Norvasc was stopped.   He is here today for follow up. The patient denies any chest pain, dyspnea, palpitations, lower extremity edema, orthopnea, PND, dizziness, near syncope or syncope. He feels great. He is back at work.    Primary Care Physician: Zola Button, Grayling Congress, DO   Past Medical History:  Diagnosis Date  . CAD (coronary artery disease)   . Hypertension    10 years ago--treated with medication    Past Surgical History:  Procedure Laterality Date  . CORONARY STENT INTERVENTION N/A 07/23/2017   Procedure: CORONARY STENT INTERVENTION;  Surgeon: Kathleene Hazel, MD;  Location: [facility];  Service: Cardiovascular;  Laterality: N/A;  . CORONARY/GRAFT ACUTE MI REVASCULARIZATION N/A 07/21/2017   Procedure: Coronary/Graft Acute MI Revascularization;  Surgeon: Kathleene Hazel, MD;  [facility]: [facility];  Service: Cardiovascular;  Laterality: N/A;  . KNEE ARTHROSCOPY    . LEFT HEART CATH AND CORONARY ANGIOGRAPHY N/A 07/21/2017   Procedure: LEFT HEART CATH AND CORONARY ANGIOGRAPHY;  Surgeon: Kathleene Hazel, MD;  [facility]: [facility];  Service: Cardiovascular;  Laterality: N/A;  . LEFT HEART  CATH AND CORONARY ANGIOGRAPHY N/A 07/23/2017   Procedure: LEFT HEART CATH AND CORONARY ANGIOGRAPHY;  Surgeon: Kathleene Hazel, MD;  [facility]: [facility];  Service: Cardiovascular;  Laterality: N/A;    Current Outpatient Medications  Medication Sig Dispense Refill  . aspirin 81 MG chewable tablet Chew 1 tablet (81 mg total) by mouth daily.    Marland Kitchen atorvastatin (LIPITOR) 80 MG tablet Take 1 tablet (80 mg total) by mouth daily at 6 PM. 90 tablet 1  . carvedilol (COREG) 12.5 MG tablet Take 1 tablet (12.5 mg total) by mouth 2 (two) times daily with a meal. 60 tablet 6  . nitroGLYCERIN (NITROSTAT) 0.4 MG SL tablet Place 1 tablet (0.4 mg total) under the tongue every 5 (five) minutes as needed. 25 tablet 2  . ticagrelor (BRILINTA) 90 MG TABS tablet Take 1 tablet (90 mg total) by mouth 2 (two) times daily. 180 tablet 2   No current facility-administered medications for this visit.     No Known Allergies  Social History   Socioeconomic History  . Marital status: Married    Spouse name: Not on file  . Number of children: Not on file  . Years of education: Not on file  . Highest education level: Not on file  Social Needs  . Financial resource strain: Not on file  . Food insecurity - worry: Not on file  . Food insecurity - inability: Not on file  . Transportation needs - medical: Not on file  . Transportation needs - non-medical: Not  on file  Occupational History  . 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 Smoking status: Never Smoker  . Smokeless tobacco: Never Used  Substance and Sexual Activity  . Alcohol use: No  . Drug use: No  . Sexual activity: Yes    Partners: Female  Other Topics Concern  . Not on file  Social History Narrative  . Not on file    Family History  Problem Relation Age of Onset  . Hypertension Mother   . Prostate cancer Father   . Hypertension Father   . Hypertension Maternal Grandmother   . Hypertension Maternal  Grandfather   . Heart failure Paternal Grandmother     Review of Systems:  As stated in the HPI and otherwise negative.   BP (!) 154/97   Pulse (!) 49   Ht 6' (1.829 m)   Wt 223 lb 12.8 oz (101.5 kg)   SpO2 98%   BMI 30.35 kg/m   Physical Examination: General: Well developed, well nourished, NAD  HEENT: OP clear, mucus membranes moist  SKIN: warm, dry. No rashes. Neuro: No focal deficits  Musculoskeletal: Muscle strength 5/5 all ext  Psychiatric: Mood and affect normal  Neck: No JVD, no carotid bruits, no thyromegaly, no lymphadenopathy.  Lungs:Clear bilaterally, no wheezes, rhonci, crackles Cardiovascular: Regular rate and rhythm. No murmurs, gallops or rubs. Abdomen:Soft. Bowel sounds present. Non-tender.  Extremities: No lower extremity edema. Pulses are 2 + in the bilateral DP/PT.  Cardiac cath 07/21/17:  Mid RCA lesion is 100% stenosed.  Acute Mrg lesion is 50% stenosed.  A drug-eluting stent was successfully placed using a STENT SYNERGY DES 3X24.  Post intervention, there is a 0% residual stenosis.  Dist RCA lesion is 80% stenosed.  A drug-eluting stent was successfully placed using a STENT SYNERGY DES 2.75X24.  Post intervention, there is a 0% residual stenosis.  Prox Cx to Mid Cx lesion is 30% stenosed.  Ramus lesion is 90% stenosed.  Ost Ramus lesion is 30% stenosed.  Ost 1st Diag lesion is 50% stenosed.  Prox LAD to Mid LAD lesion is 20% stenosed.  The left ventricular systolic function is normal.  LV end diastolic pressure is normal.  The left ventricular ejection fraction is greater than 65% by visual estimate.  There is no mitral valve regurgitation.  Echo 07/22/17: - Left ventricle: The cavity size was normal. There was moderate   concentric hypertrophy. Systolic function was normal. The   estimated ejection fraction was in the range of 60% to 65%. Wall   motion was normal; there were no regional wall motion   abnormalities. Doppler  parameters are consistent with abnormal   left ventricular relaxation (grade 1 diastolic dysfunction). - Aortic valve: Transvalvular velocity was within the normal range.   There was no stenosis. There was mild regurgitation. Valve area   (Vmax): 1.94 cm^2. - Mitral valve: Transvalvular velocity was within the normal range.   There was no evidence for stenosis. There was no regurgitation.   Valve area by pressure half-time: 1.71 cm^2. - Right ventricle: The cavity size was normal. Wall thickness was   normal. Systolic function was normal. - Atrial septum: No defect or patent foramen ovale was identified. - Tricuspid valve: There was no regurgitation. - Pericardium, extracardiac: There was a left pleural effusion.  EKG:  EKG is not ordered today. The ekg ordered today demonstrates   Recent Labs: 07/24/2017: Hemoglobin 12.6; Platelets 254 09/23/2017: ALT 21 09/24/2017: BUN 11;  Creatinine, Ser 1.26; Potassium 4.5; Sodium 140   Lipid Panel    Component Value Date/Time   CHOL 133 09/23/2017 1113   TRIG 72 09/23/2017 1113   HDL 36 (L) 09/23/2017 1113   CHOLHDL 3.7 09/23/2017 1113   CHOLHDL 6.8 07/21/2017 1434   VLDL 54 (H) 07/21/2017 1434   LDLCALC 83 09/23/2017 1113   LDLDIRECT 221.8 12/07/2011 0814     Wt Readings from Last 3 Encounters:  10/02/17 223 lb 12.8 oz (101.5 kg)  09/24/17 220 lb 3.2 oz (99.9 kg)  09/17/17 221 lb 1.9 oz (100.3 kg)     Other studies Reviewed: Additional studies/ records that were reviewed today include: . Review of the above records demonstrates:    Assessment and Plan:   1. CAD without angina: No chest pain. Will continue ASA, Brilinta, statin and beta blocker. He does not wish to continue cardiac rehab.   2. HTN: BP is controlled at home. No changes.   3. HLD: continue statin.   Current medicines are reviewed at length with the patient today.  The patient does not have concerns regarding medicines.  The following changes have been made:   no change  Labs/ tests ordered today include:  No orders of the defined types were placed in this encounter.    Disposition:   FU with me in 6 months   Signed, Verne Carrowhristopher Bina Veenstra, MD 10/02/2017 10:32 AM    [facility] [address], [place], [place]  [address] Phone: [phone]; Fax: [phone]

## 2017-10-04 ENCOUNTER — Ambulatory Visit (HOSPITAL_COMMUNITY): Payer: BLUE CROSS/BLUE SHIELD

## 2017-10-07 ENCOUNTER — Ambulatory Visit (HOSPITAL_COMMUNITY): Payer: BLUE CROSS/BLUE SHIELD

## 2017-10-08 NOTE — Progress Notes (Signed)
Discharge Progress Report  Patient Details  Name: Chad Chase MRN: [id number] Date of Birth: [birth year] Referring Provider:     CARDIAC REHAB PHASE II [facility]  Referring Provider  Verne CarrowMcAlhany, Christopher, MD.       Number of Visits: 1-orientation  Reason for Discharge:  Early Exit:  Lack of attendance, pt has not attended group exercise sessions.   Smoking History:  Social History   Tobacco Use  Smoking Status Never Smoker  Smokeless Tobacco Never Used    Diagnosis:  No diagnosis found.  ADL UCSD:   Initial Exercise Prescription: Initial Exercise Prescription - 09/17/17 1100      Date of Initial Exercise RX and Referring Provider   Date  09/17/17    Referring Provider  Verne CarrowMcAlhany, Christopher, MD.      Treadmill   MPH  3    Grade  1    Minutes  10    METs  3.71      Bike   Level  1.8    Minutes  10    METs  4.4      NuStep   Level  4    SPM  85    Minutes  10    METs  3.7      Prescription Details   Frequency (times per week)  3    Duration  Progress to 30 minutes of continuous aerobic without signs/symptoms of physical distress      Intensity   THRR 40-80% of Max Heartrate  69-138    Ratings of Perceived Exertion  11-13    Perceived Dyspnea  0-4      Progression   Progression  Continue to progress workloads to maintain intensity without signs/symptoms of physical distress.      Resistance Training   Training Prescription  Yes    Weight  5lbs    Reps  10-15       Discharge Exercise Prescription (Final Exercise Prescription Changes):   Functional Capacity: 6 Minute Walk    Row Name 09/17/17 1137         6 Minute Walk   Phase  Initial     Distance  1930 feet     Walk Time  6 minutes     # of Rest Breaks  0     MPH  3.66     METS  4.94     RPE  11     VO2 Peak  17.29     Symptoms  No     Resting HR  57 bpm     Resting BP  128/84     Resting Oxygen Saturation   99 %      Exercise Oxygen Saturation  during 6 min walk  98 %     Max Ex. HR  93 bpm     Max Ex. BP  120/80     2 Minute Post BP  90/54 100/54 recheck BP        Psychological, QOL, Others - Outcomes: PHQ 2/9: Depression screen PHQ 2/9 07/29/2017  Decreased Interest 0  Down, Depressed, Hopeless 0  PHQ - 2 Score 0    Quality of Life: Quality of Life - 09/17/17 1202      Quality of Life Scores   GLOBAL Pre  -- pt plans to return pre QOL questionnaire at first group exercise session       Personal Goals: Goals established at orientation  with interventions provided to work toward goal. Personal Goals and Risk Factors at Admission - 09/17/17 1157      Core Components/Risk Factors/Patient Goals on Admission    Weight Management  --        Personal Goals Discharge:   Exercise Goals and Review: Exercise Goals    Row Name 09/17/17 1140             Exercise Goals   Increase Physical Activity  Yes       Intervention  Provide advice, education, support and counseling about physical activity/exercise needs.;Develop an individualized exercise prescription for aerobic and resistive training based on initial evaluation findings, risk stratification, comorbidities and participant's personal goals.       Expected Outcomes  Short Term: Attend rehab on a regular basis to increase amount of physical activity.;Long Term: Exercising regularly at least 3-5 days a week.;Long Term: Add in home exercise to make exercise part of routine and to increase amount of physical activity.       Increase Strength and Stamina  Yes       Intervention  Provide advice, education, support and counseling about physical activity/exercise needs.;Develop an individualized exercise prescription for aerobic and resistive training based on initial evaluation findings, risk stratification, comorbidities and participant's personal goals.       Expected Outcomes  Short Term: Increase workloads from initial exercise  prescription for resistance, speed, and METs.;Short Term: Perform resistance training exercises routinely during rehab and add in resistance training at home;Long Term: Improve cardiorespiratory fitness, muscular endurance and strength as measured by increased METs and functional capacity ( )       Able to understand and use rate of perceived exertion (RPE) scale  Yes       Intervention  Provide education and explanation on how to use RPE scale       Expected Outcomes  Short Term: Able to use RPE daily in rehab to express subjective intensity level;Long Term:  Able to use RPE to guide intensity level when exercising independently       Able to understand and use Dyspnea scale  Yes       Intervention  Provide education and explanation on how to use Dyspnea scale       Expected Outcomes  Short Term: Able to use Dyspnea scale daily in rehab to express subjective sense of shortness of breath during exertion;Long Term: Able to use Dyspnea scale to guide intensity level when exercising independently       Knowledge and understanding of Target Heart Rate Range (THRR)  Yes       Intervention  Provide education and explanation of THRR including how the numbers were predicted and where they are located for reference       Expected Outcomes  Short Term: Able to state/look up THRR;Long Term: Able to use THRR to govern intensity when exercising independently;Short Term: Able to use daily as guideline for intensity in rehab       Able to check pulse independently  Yes       Intervention  Provide education and demonstration on how to check pulse in carotid and radial arteries.;Review the importance of being able to check your own pulse for safety during independent exercise       Expected Outcomes  Short Term: Able to explain why pulse checking is important during independent exercise;Long Term: Able to check pulse independently and accurately       Understanding of Exercise Prescription  Yes  Intervention   Provide education, explanation, and written materials on patient's individual exercise prescription       Expected Outcomes  Short Term: Able to explain program exercise prescription;Long Term: Able to explain home exercise prescription to exercise independently          Nutrition & Weight - Outcomes: Pre Biometrics - 09/17/17 0851      Pre Biometrics   Height  5' 10.75" (1.797 m)    Weight  221 lb 1.9 oz (100.3 kg)    Waist Circumference  38.5 inches    Hip Circumference  44 inches    Waist to Hip Ratio  0.88 %    BMI (Calculated)  31.06    Triceps Skinfold  13 mm    % Body Fat  26.4 %    Grip Strength  51.5 kg    Flexibility  12 in    Single Leg Stand  30 seconds        Nutrition: Nutrition Therapy & Goals - 09/23/17 1117      Nutrition Therapy   Diet  Heart Healthy      Personal Nutrition Goals   Nutrition Goal  Pt to identify food quantities necessary to achieve weight loss of 6-24 lb (2.7-10.9 kg) at graduation from cardiac rehab.       Intervention Plan   Intervention  Prescribe, educate and counsel regarding individualized specific dietary modifications aiming towards targeted core components such as weight, hypertension, lipid management, diabetes, heart failure and other comorbidities.    Expected Outcomes  Short Term Goal: Understand basic principles of dietary content, such as calories, fat, sodium, cholesterol and nutrients.;Long Term Goal: Adherence to prescribed nutrition plan.       Nutrition Discharge:   Education Questionnaire Score: Knowledge Questionnaire Score - 09/17/17 1143      Knowledge Questionnaire Score   Pre Score  15/24       Goals reviewed with patient; copy given to patient.

## 2017-10-09 ENCOUNTER — Ambulatory Visit (HOSPITAL_COMMUNITY): Payer: BLUE CROSS/BLUE SHIELD

## 2017-10-09 ENCOUNTER — Inpatient Hospital Stay (HOSPITAL_COMMUNITY)
Admission: RE | Admit: 2017-10-09 | Discharge: 2017-10-09 | Disposition: A | Discharge status: Home / Self Care | Payer: BLUE CROSS/BLUE SHIELD | Source: Ambulatory Visit

## 2017-10-11 ENCOUNTER — Ambulatory Visit (HOSPITAL_COMMUNITY): Payer: BLUE CROSS/BLUE SHIELD

## 2017-10-11 ENCOUNTER — Encounter (HOSPITAL_COMMUNITY): Payer: BLUE CROSS/BLUE SHIELD

## 2017-10-14 ENCOUNTER — Ambulatory Visit (HOSPITAL_COMMUNITY): Payer: BLUE CROSS/BLUE SHIELD

## 2017-10-16 ENCOUNTER — Ambulatory Visit (HOSPITAL_COMMUNITY): Payer: BLUE CROSS/BLUE SHIELD

## 2017-10-18 ENCOUNTER — Ambulatory Visit (HOSPITAL_COMMUNITY): Payer: BLUE CROSS/BLUE SHIELD

## 2017-10-21 ENCOUNTER — Ambulatory Visit (HOSPITAL_COMMUNITY): Payer: BLUE CROSS/BLUE SHIELD

## 2017-10-23 ENCOUNTER — Ambulatory Visit (HOSPITAL_COMMUNITY): Payer: BLUE CROSS/BLUE SHIELD

## 2017-10-25 ENCOUNTER — Ambulatory Visit (HOSPITAL_COMMUNITY): Payer: BLUE CROSS/BLUE SHIELD

## 2017-10-28 ENCOUNTER — Ambulatory Visit (HOSPITAL_COMMUNITY): Payer: BLUE CROSS/BLUE SHIELD

## 2017-10-30 ENCOUNTER — Ambulatory Visit (HOSPITAL_COMMUNITY): Payer: BLUE CROSS/BLUE SHIELD

## 2017-11-01 ENCOUNTER — Ambulatory Visit (HOSPITAL_COMMUNITY): Payer: BLUE CROSS/BLUE SHIELD

## 2017-11-04 ENCOUNTER — Ambulatory Visit (HOSPITAL_COMMUNITY): Payer: BLUE CROSS/BLUE SHIELD

## 2017-11-06 ENCOUNTER — Ambulatory Visit (HOSPITAL_COMMUNITY): Payer: BLUE CROSS/BLUE SHIELD

## 2017-11-08 ENCOUNTER — Ambulatory Visit (HOSPITAL_COMMUNITY): Payer: BLUE CROSS/BLUE SHIELD

## 2017-11-11 ENCOUNTER — Ambulatory Visit (HOSPITAL_COMMUNITY): Payer: BLUE CROSS/BLUE SHIELD

## 2017-11-13 ENCOUNTER — Ambulatory Visit (HOSPITAL_COMMUNITY): Payer: BLUE CROSS/BLUE SHIELD

## 2017-11-15 ENCOUNTER — Ambulatory Visit (HOSPITAL_COMMUNITY): Payer: BLUE CROSS/BLUE SHIELD

## 2017-11-18 ENCOUNTER — Ambulatory Visit (HOSPITAL_COMMUNITY): Payer: BLUE CROSS/BLUE SHIELD

## 2017-11-20 ENCOUNTER — Ambulatory Visit (HOSPITAL_COMMUNITY): Payer: BLUE CROSS/BLUE SHIELD

## 2017-11-22 ENCOUNTER — Ambulatory Visit (HOSPITAL_COMMUNITY): Payer: BLUE CROSS/BLUE SHIELD

## 2017-11-25 ENCOUNTER — Ambulatory Visit (HOSPITAL_COMMUNITY): Payer: BLUE CROSS/BLUE SHIELD

## 2017-11-27 ENCOUNTER — Ambulatory Visit (HOSPITAL_COMMUNITY): Payer: BLUE CROSS/BLUE SHIELD

## 2017-11-29 ENCOUNTER — Ambulatory Visit (HOSPITAL_COMMUNITY): Payer: BLUE CROSS/BLUE SHIELD

## 2017-12-02 ENCOUNTER — Ambulatory Visit (HOSPITAL_COMMUNITY): Payer: BLUE CROSS/BLUE SHIELD

## 2017-12-04 ENCOUNTER — Ambulatory Visit (HOSPITAL_COMMUNITY): Payer: BLUE CROSS/BLUE SHIELD

## 2017-12-06 ENCOUNTER — Ambulatory Visit (HOSPITAL_COMMUNITY): Payer: BLUE CROSS/BLUE SHIELD

## 2017-12-09 ENCOUNTER — Ambulatory Visit (HOSPITAL_COMMUNITY): Payer: BLUE CROSS/BLUE SHIELD

## 2017-12-11 ENCOUNTER — Ambulatory Visit (HOSPITAL_COMMUNITY): Payer: BLUE CROSS/BLUE SHIELD

## 2017-12-13 ENCOUNTER — Ambulatory Visit (HOSPITAL_COMMUNITY): Payer: BLUE CROSS/BLUE SHIELD

## 2017-12-14 ENCOUNTER — Other Ambulatory Visit: Payer: Self-pay | Admitting: Cardiology

## 2017-12-14 DIAGNOSIS — I10 Essential (primary) hypertension: Secondary | ICD-10-CM

## 2017-12-18 NOTE — Telephone Encounter (Signed)
I spoke with pt who confirms he is not taking amlodipine.  Will contact pharmacy to remove from his profile

## 2017-12-18 NOTE — Telephone Encounter (Signed)
Norvasc was stopped in January 2019 due to low BP at cardiac rehab. I placed call to pt to discuss. Left message to call back

## 2017-12-23 ENCOUNTER — Other Ambulatory Visit (INDEPENDENT_AMBULATORY_CARE_PROVIDER_SITE_OTHER): Payer: BLUE CROSS/BLUE SHIELD

## 2017-12-23 ENCOUNTER — Encounter: Payer: Self-pay | Admitting: Family Medicine

## 2017-12-23 DIAGNOSIS — R739 Hyperglycemia, unspecified: Secondary | ICD-10-CM

## 2017-12-23 DIAGNOSIS — I10 Essential (primary) hypertension: Secondary | ICD-10-CM

## 2017-12-23 DIAGNOSIS — E119 Type 2 diabetes mellitus without complications: Secondary | ICD-10-CM | POA: Insufficient documentation

## 2017-12-23 LAB — COMPREHENSIVE METABOLIC PANEL
ALT: 26 U/L (ref 0–53)
AST: 28 U/L (ref 0–37)
Albumin: 4 g/dL (ref 3.5–5.2)
Alkaline Phosphatase: 72 U/L (ref 39–117)
BUN: 11 mg/dL (ref 6–23)
CALCIUM: 9.6 mg/dL (ref 8.4–10.5)
CO2: 31 mEq/L (ref 19–32)
CREATININE: 1.28 mg/dL (ref 0.40–1.50)
Chloride: 104 mEq/L (ref 96–112)
GFR: 77.24 mL/min (ref >60.00)
Glucose, Bld: 100 mg/dL — ABNORMAL HIGH (ref 70–99)
Potassium: 5 mEq/L (ref 3.5–5.1)
SODIUM: 141 meq/L (ref 135–145)
Total Bilirubin: 0.4 mg/dL (ref 0.2–1.2)
Total Protein: 7.1 g/dL (ref 6.0–8.3)

## 2017-12-23 LAB — HEMOGLOBIN A1C: Hgb A1c MFr Bld: 7 % — ABNORMAL HIGH (ref 4.6–6.5)

## 2018-01-13 ENCOUNTER — Other Ambulatory Visit: Payer: Self-pay | Admitting: Cardiology

## 2018-03-21 ENCOUNTER — Ambulatory Visit: Payer: Self-pay

## 2018-03-21 NOTE — Telephone Encounter (Signed)
Patient's wife called in about patient having an elevated BP and headache. I called the patient and he says "my headache is not that bad, but my BP was checked an hour ago and it was 167/107 and 165/103." I asked about missing doses of medications, he says he is taking medications and has missed doses, but this week he is sure he's taken them every day, he takes Carvedilol BID. I asked about other symptoms besides headache, he denies. According to protocol, go to ED. He says "I don't want to, so this is what I will do. I haven't taken my evening medication. I will take that and re-check my BP around 8 pm. If my BP is still up at that time, I will go to the ED." I advised to continue to check his BP at least twice a day over the weekend and write it down and call on Monday to schedule an appointment, if they are elevated. He says he will do that and if not, he will follow up on 03/31/18 at his already scheduled appointment.  Reason for Disposition . [1] Systolic BP  >= 160 OR Diastolic >= 100 AND [2] cardiac or neurologic symptoms (e.g., chest pain, difficulty breathing, unsteady gait, blurred vision)  Answer Assessment - Initial Assessment Questions 1. BLOOD PRESSURE: "What is the blood pressure?" "Did you take at least two measurements 5 minutes apart?"     167/107 and 165/103 checked 1 hour ago  2. ONSET: "When did you take your blood pressure?"     1 hour ago today 3. HOW: "How did you obtain the blood pressure?" (e.g., visiting nurse, automatic home BP monitor)     Automatic home BP monitor 4. HISTORY: "Do you have a history of high blood pressure?"     Yes 5. MEDICATIONS: "Are you taking any medications for blood pressure?" "Have you missed any doses recently?"     Yes; haven't missed doses lately 6. OTHER SYMPTOMS: "Do you have any symptoms?" (e.g., headache, chest pain, blurred vision, difficulty breathing, weakness)     Headache 7. PREGNANCY: "Is there any chance you are pregnant?" "When was  your last menstrual period?"     N/A  Protocols used: HIGH BLOOD PRESSURE-A-AH

## 2018-03-31 ENCOUNTER — Ambulatory Visit (INDEPENDENT_AMBULATORY_CARE_PROVIDER_SITE_OTHER): Payer: BLUE CROSS/BLUE SHIELD | Admitting: Family Medicine

## 2018-03-31 ENCOUNTER — Encounter: Payer: Self-pay | Admitting: Family Medicine

## 2018-03-31 VITALS — BP 106/79 | HR 56 | Temp 98.4°F | Resp 16 | Ht 72.0 in | Wt 226.6 lb

## 2018-03-31 DIAGNOSIS — I10 Essential (primary) hypertension: Secondary | ICD-10-CM | POA: Diagnosis not present

## 2018-03-31 DIAGNOSIS — E1151 Type 2 diabetes mellitus with diabetic peripheral angiopathy without gangrene: Secondary | ICD-10-CM

## 2018-03-31 DIAGNOSIS — E1165 Type 2 diabetes mellitus with hyperglycemia: Secondary | ICD-10-CM | POA: Diagnosis not present

## 2018-03-31 DIAGNOSIS — I2119 ST elevation (STEMI) myocardial infarction involving other coronary artery of inferior wall: Secondary | ICD-10-CM

## 2018-03-31 DIAGNOSIS — E785 Hyperlipidemia, unspecified: Secondary | ICD-10-CM

## 2018-03-31 DIAGNOSIS — E119 Type 2 diabetes mellitus without complications: Secondary | ICD-10-CM | POA: Diagnosis not present

## 2018-03-31 DIAGNOSIS — IMO0002 Reserved for concepts with insufficient information to code with codable children: Secondary | ICD-10-CM

## 2018-03-31 LAB — LIPID PANEL
CHOL/HDL RATIO: 5
CHOLESTEROL: 155 mg/dL (ref 0–200)
HDL: 28.7 mg/dL — AB (ref >39.00)
NonHDL: 126.24
TRIGLYCERIDES: 219 mg/dL — AB (ref 0.0–149.0)
VLDL: 43.8 mg/dL — AB (ref 0.0–40.0)

## 2018-03-31 LAB — COMPREHENSIVE METABOLIC PANEL
ALBUMIN: 4.4 g/dL (ref 3.5–5.2)
ALT: 25 U/L (ref 0–53)
AST: 35 U/L (ref 0–37)
Alkaline Phosphatase: 86 U/L (ref 39–117)
BUN: 13 mg/dL (ref 6–23)
CALCIUM: 9.6 mg/dL (ref 8.4–10.5)
CHLORIDE: 103 meq/L (ref 96–112)
CO2: 27 meq/L (ref 19–32)
Creatinine, Ser: 1.38 mg/dL (ref 0.40–1.50)
GFR: 70.73 mL/min (ref >60.00)
Glucose, Bld: 128 mg/dL — ABNORMAL HIGH (ref 70–99)
Potassium: 4.3 mEq/L (ref 3.5–5.1)
Sodium: 137 mEq/L (ref 135–145)
Total Bilirubin: 0.5 mg/dL (ref 0.2–1.2)
Total Protein: 7.3 g/dL (ref 6.0–8.3)

## 2018-03-31 LAB — HEMOGLOBIN A1C: HEMOGLOBIN A1C: 6.9 % — AB (ref 4.6–6.5)

## 2018-03-31 LAB — LDL CHOLESTEROL, DIRECT: Direct LDL: 93 mg/dL

## 2018-03-31 MED ORDER — BLOOD GLUCOSE MONITOR KIT: PACK | 0 refills | Status: AC

## 2018-03-31 NOTE — Progress Notes (Signed)
Patient ID: Chad Chase, male   DOB: [birth year], 47 y.o.   MRN: [id number]     Subjective:  I acted as a Education administrator for Dr. Carollee Chase.  Chad Chase, Chad Chase   Patient ID: Chad Chase, male    DOB: Feb 18, 1971, 47 y.o.   MRN: [id number]  Chief Complaint  Patient presents with  . Diabetes    Diabetes  Pertinent negatives for hypoglycemia include no dizziness, headaches or nervousness/anxiousness. Pertinent negatives for diabetes include no blurred vision, no chest pain and no weakness.    Patient is in today for follow up diabetes bp and cholesterol.       HYPERTENSION   Blood pressure range-130/80s   Chest pain- no      Dyspnea- no Lightheadedness- no   Edema- no  Other side effects - no   Medication compliance: good Low salt diet- yes    DIABETES    Blood Sugar ranges-not checking   Polyuria- no New Visual problems- no  Hypoglycemic symptoms- no  Other side effects-no Medication compliance - good Last eye exam- due Foot exam- today   HYPERLIPIDEMIA  Medication compliance- good RUQ pain- no  Muscle aches- no Other side effects-no  Patient Care Team: Ann Held, DO as PCP - General (Family Medicine) Burnell Blanks, MD as PCP - Cardiology (Cardiology)   Past Medical History:  Diagnosis Date  . CAD (coronary artery disease)   . Hypertension    10 years ago--treated with medication    Past Surgical History:  Procedure Laterality Date  . CORONARY STENT INTERVENTION N/A 07/23/2017   Procedure: CORONARY STENT INTERVENTION;  Surgeon: Burnell Blanks, MD;  Location: [place] [facility];  Service: Cardiovascular;  Laterality: N/A;  . CORONARY/GRAFT ACUTE MI REVASCULARIZATION N/A 07/21/2017   Procedure: Coronary/Graft Acute MI Revascularization;  Surgeon: Burnell Blanks, MD;  Location: [place] [facility];  Service: Cardiovascular;  Laterality: N/A;  . KNEE ARTHROSCOPY    . LEFT HEART CATH AND CORONARY ANGIOGRAPHY N/A 07/21/2017   Procedure: LEFT HEART CATH AND CORONARY ANGIOGRAPHY;  Surgeon: Burnell Blanks, MD;  Location: [place] [facility];  Service: Cardiovascular;  Laterality: N/A;  . LEFT HEART CATH AND CORONARY ANGIOGRAPHY N/A 07/23/2017   Procedure: LEFT HEART CATH AND CORONARY ANGIOGRAPHY;  Surgeon: Burnell Blanks, MD;  Location: [place] [facility];  Service: Cardiovascular;  Laterality: N/A;    Family History  Problem Relation Age of Onset  . Hypertension Mother   . Prostate cancer Father   . Hypertension Father   . Hypertension Maternal Grandmother   . Hypertension Maternal Grandfather   . Heart failure Paternal Grandmother     Social History   Socioeconomic History  . Marital status: Married    Spouse name: Not on file  . Number of children: Not on file  . Years of education: Not on file  . Highest education level: Not on file  Occupational History  . Occupation: self employed    Fish farm manager: [employer]  Social Needs  . Financial resource strain: Not on file  . Food insecurity:    Worry: Not on file    Inability: Not on file  . Transportation needs:    Medical: Not on file    Non-medical: Not on file  Tobacco Use  . Smoking status: Never Smoker  . Smokeless tobacco: Never Used  Substance and Sexual Activity  . Alcohol use: No  . Drug use: No  . Sexual activity: Yes  Partners: Female  Lifestyle  . Physical activity:    Days per week: Not on file    Minutes per session: Not on file  . Stress: Not on file  Relationships  . Social connections:    Talks on phone: Not on file    Gets together: Not on file    Attends religious service: Not on file    Active member of club or organization: Not on file    Attends meetings of clubs or organizations: Not on file    Relationship status: Not on file  . Intimate partner violence:    Fear of current or ex partner: Not on file    Emotionally abused: Not on file    Physically abused: Not on file    Forced sexual activity:  Not on file  Other Topics Concern  . Not on file  Social History Narrative  . Not on file    Outpatient Medications Prior to Visit  Medication Sig Dispense Refill  . aspirin 81 MG chewable tablet Chew 1 tablet (81 mg total) by mouth daily.    Marland Kitchen atorvastatin (LIPITOR) 80 MG tablet TAKE 1 TABLET (80 MG TOTAL) BY MOUTH DAILY AT 6 PM 90 tablet 2  . carvedilol (COREG) 12.5 MG tablet Take 1 tablet (12.5 mg total) by mouth 2 (two) times daily with a meal. 60 tablet 6  . nitroGLYCERIN (NITROSTAT) 0.4 MG SL tablet Place 1 tablet (0.4 mg total) under the tongue every 5 (five) minutes as needed. 25 tablet 2  . ticagrelor (BRILINTA) 90 MG TABS tablet Take 1 tablet (90 mg total) by mouth 2 (two) times daily. 180 tablet 2   No facility-administered medications prior to visit.     No Known Allergies  Review of Systems  Constitutional: Negative for chills, fever and malaise/fatigue.  HENT: Negative for congestion and hearing loss.   Eyes: Negative for blurred vision and discharge.  Respiratory: Negative for cough, sputum production and shortness of breath.   Cardiovascular: Negative for chest pain, palpitations and leg swelling.  Gastrointestinal: Negative for abdominal pain, blood in stool, constipation, diarrhea, heartburn, nausea and vomiting.  Genitourinary: Negative for dysuria, frequency, hematuria and urgency.  Musculoskeletal: Negative for back pain, falls and myalgias.  Skin: Negative for rash.  Neurological: Negative for dizziness, tingling (left foot), sensory change, loss of consciousness, weakness and headaches.  Endo/Heme/Allergies: Negative for environmental allergies. Does not bruise/bleed easily.  Psychiatric/Behavioral: Negative for depression and suicidal ideas. The patient is not nervous/anxious and does not have insomnia.    Diabetic Foot Exam - Simple   Simple Foot Form Diabetic Foot exam was performed with the following findings:  Yes 03/31/2018  9:01 AM  Visual  Inspection No deformities, no ulcerations, no other skin breakdown bilaterally:  Yes Sensation Testing Intact to touch and monofilament testing bilaterally:  Yes Pulse Check Posterior Tibialis and Dorsalis pulse intact bilaterally:  Yes Comments        Objective:    Physical Exam  Constitutional: He is oriented to person, place, and time. Vital signs are normal. He appears well-developed and well-nourished. He is sleeping.  HENT:  Head: Normocephalic and atraumatic.  Mouth/Throat: Oropharynx is clear and moist.  Eyes: Pupils are equal, round, and reactive to light. EOM are normal.  Neck: Normal range of motion. Neck supple. No thyromegaly present.  Cardiovascular: Normal rate and regular rhythm.  No murmur heard. Pulmonary/Chest: Effort normal and breath sounds normal. No respiratory distress. He has no wheezes. He has no rales.  He exhibits no tenderness.  Musculoskeletal: He exhibits no edema or tenderness.  Neurological: He is alert and oriented to person, place, and time.  Skin: Skin is warm and dry.  Psychiatric: He has a normal mood and affect. His behavior is normal. Judgment and thought content normal.  Nursing note and vitals reviewed. .fott  BP 106/79   Pulse (!) 56   Temp 98.4 F (36.9 C) (Oral)   Resp 16   Ht 6' (1.829 m)   Wt 226 lb 9.6 oz (102.8 kg)   SpO2 100%   BMI 30.73 kg/m  Wt Readings from Last 3 Encounters:  03/31/18 226 lb 9.6 oz (102.8 kg)  10/02/17 223 lb 12.8 oz (101.5 kg)  09/24/17 220 lb 3.2 oz (99.9 kg)   BP Readings from Last 3 Encounters:  03/31/18 106/79  10/02/17 (!) 154/97  09/24/17 126/86     Immunization History  Administered Date(s) Administered  . Tdap 11/30/2011    Health Maintenance  Topic Date Due  . PNEUMOCOCCAL POLYSACCHARIDE VACCINE (1) 04/08/1972  . FOOT EXAM  04/08/1980  . OPHTHALMOLOGY EXAM  04/08/1980  . URINE MICROALBUMIN  04/08/1980  . HIV Screening  04/08/1985  . INFLUENZA VACCINE  05/26/2018 (Originally  03/27/2018)  . HEMOGLOBIN A1C  06/24/2018  . TETANUS/TDAP  11/29/2021    Lab Results  Component Value Date   WBC 14.6 (H) 07/24/2017   HGB 12.6 (L) 07/24/2017   HCT 38.0 (L) 07/24/2017   PLT 254 07/24/2017   GLUCOSE 100 (H) 12/23/2017   CHOL 133 09/23/2017   TRIG 72 09/23/2017   HDL 36 (L) 09/23/2017   LDLDIRECT 221.8 12/07/2011   LDLCALC 83 09/23/2017   ALT 26 12/23/2017   AST 28 12/23/2017   NA 141 12/23/2017   K 5.0 12/23/2017   CL 104 12/23/2017   CREATININE 1.28 12/23/2017   BUN 11 12/23/2017   CO2 31 12/23/2017   TSH 1.22 06/21/2016   PSA 0.38 06/21/2016   INR 0.94 07/22/2017   HGBA1C 7.0 (H) 12/23/2017    Lab Results  Component Value Date   TSH 1.22 06/21/2016   Lab Results  Component Value Date   WBC 14.6 (H) 07/24/2017   HGB 12.6 (L) 07/24/2017   HCT 38.0 (L) 07/24/2017   MCV 87.2 07/24/2017   PLT 254 07/24/2017   Lab Results  Component Value Date   NA 141 12/23/2017   K 5.0 12/23/2017   CO2 31 12/23/2017   GLUCOSE 100 (H) 12/23/2017   BUN 11 12/23/2017   CREATININE 1.28 12/23/2017   BILITOT 0.4 12/23/2017   ALKPHOS 72 12/23/2017   AST 28 12/23/2017   ALT 26 12/23/2017   PROT 7.1 12/23/2017   ALBUMIN 4.0 12/23/2017   CALCIUM 9.6 12/23/2017   ANIONGAP 10 07/24/2017   GFR 77.24 12/23/2017   Lab Results  Component Value Date   CHOL 133 09/23/2017   Lab Results  Component Value Date   HDL 36 (L) 09/23/2017   Lab Results  Component Value Date   LDLCALC 83 09/23/2017   Lab Results  Component Value Date   TRIG 72 09/23/2017   Lab Results  Component Value Date   CHOLHDL 3.7 09/23/2017   Lab Results  Component Value Date   HGBA1C 7.0 (H) 12/23/2017         Assessment & Plan:   Problem List Items Addressed This Visit      Unprioritized   Acute ST elevation myocardial infarction (STEMI) of inferior wall ([place])  On brilinta  F/u cardiology      Diet-controlled diabetes mellitus ([place])    con't diet  Check labs         Hyperlipidemia    Tolerating statin, encouraged heart healthy diet, avoid trans fats, minimize simple carbs and saturated fats. Increase exercise as tolerated      Relevant Orders   Lipid panel   Hypertension - Primary    Well controlled, no changes to meds. Encouraged heart healthy diet such as the DASH diet and exercise as tolerated.       Relevant Orders   Comprehensive metabolic panel   Lipid panel    Other Visit Diagnoses    DM (diabetes mellitus) type II uncontrolled, periph vascular disorder ([place])       Relevant Medications   blood glucose meter kit and supplies KIT   Other Relevant Orders   Comprehensive metabolic panel   Hemoglobin A1c      I am having Alvester Chou L. Copes start on blood glucose meter kit and supplies. I am also having him maintain his aspirin, ticagrelor, nitroGLYCERIN, carvedilol, and atorvastatin.  Meds ordered this encounter  Medications  . blood glucose meter kit and supplies KIT    Sig: Dispense based on patient and insurance preference. Use up to four times daily as directed. (FOR ICD-9 250.00, 250.01).    Dispense:  1 each    Refill:  0    Order Specific Question:   Number of strips    Answer:   100    Order Specific Question:   Number of lancets    Answer:   100    CMA served as scribe during this visit. History, Physical and Plan performed by medical provider. Documentation and orders reviewed and attested to.  Ann Held, DO

## 2018-03-31 NOTE — Assessment & Plan Note (Signed)
Tolerating statin, encouraged heart healthy diet, avoid trans fats, minimize simple carbs and saturated fats. Increase exercise as tolerated 

## 2018-03-31 NOTE — Assessment & Plan Note (Signed)
Well controlled, no changes to meds. Encouraged heart healthy diet such as the DASH diet and exercise as tolerated.  °

## 2018-03-31 NOTE — Assessment & Plan Note (Signed)
On brilinta  F/u cardiology

## 2018-03-31 NOTE — Patient Instructions (Signed)
DASH Eating Plan DASH stands for "Dietary Approaches to Stop Hypertension." The DASH eating plan is a healthy eating plan that has been shown to reduce high blood pressure (hypertension). It may also reduce your risk for type 2 diabetes, heart disease, and stroke. The DASH eating plan may also help with weight loss. What are tips for following this plan? General guidelines  Avoid eating more than 2,300 mg (milligrams) of salt (sodium) a day. If you have hypertension, you may need to reduce your sodium intake to 1,500 mg a day.  Limit alcohol intake to no more than 1 drink a day for nonpregnant women and 2 drinks a day for men. One drink equals 12 oz of beer, 5 oz of wine, or 1 oz of hard liquor.  Work with your health care provider to maintain a healthy body weight or to lose weight. Ask what an ideal weight is for you.  Get at least 30 minutes of exercise that causes your heart to beat faster (aerobic exercise) most days of the week. Activities may include walking, swimming, or biking.  Work with your health care provider or diet and nutrition specialist (dietitian) to adjust your eating plan to your individual calorie needs. Reading food labels  Check food labels for the amount of sodium per serving. Choose foods with less than 5 percent of the Daily Value of sodium. Generally, foods with less than 300 mg of sodium per serving fit into this eating plan.  To find whole grains, look for the word "whole" as the first word in the ingredient list. Shopping  Buy products labeled as "low-sodium" or "no salt added."  Buy fresh foods. Avoid canned foods and premade or frozen meals. Cooking  Avoid adding salt when cooking. Use salt-free seasonings or herbs instead of table salt or sea salt. Check with your health care provider or pharmacist before using salt substitutes.  Do not fry foods. Cook foods using healthy methods such as baking, boiling, grilling, and broiling instead.  Cook with  heart-healthy oils, such as olive, canola, soybean, or sunflower oil. Meal planning   Eat a balanced diet that includes: ? 5 or more servings of fruits and vegetables each day. At each meal, try to fill half of your plate with fruits and vegetables. ? Up to 6-8 servings of whole grains each day. ? Less than 6 oz of lean meat, poultry, or fish each day. A 3-oz serving of meat is about the same size as a deck of cards. One egg equals 1 oz. ? 2 servings of low-fat dairy each day. ? A serving of nuts, seeds, or beans 5 times each week. ? Heart-healthy fats. Healthy fats called Omega-3 fatty acids are found in foods such as flaxseeds and coldwater fish, like sardines, salmon, and mackerel.  Limit how much you eat of the following: ? Canned or prepackaged foods. ? Food that is high in trans fat, such as fried foods. ? Food that is high in saturated fat, such as fatty meat. ? Sweets, desserts, sugary drinks, and other foods with added sugar. ? Full-fat dairy products.  Do not salt foods before eating.  Try to eat at least 2 vegetarian meals each week.  Eat more home-cooked food and less restaurant, buffet, and fast food.  When eating at a restaurant, ask that your food be prepared with less salt or no salt, if possible. What foods are recommended? The items listed may not be a complete list. Talk with your dietitian about what   dietary choices are best for you. Grains Whole-grain or whole-wheat bread. Whole-grain or whole-wheat pasta. Brown rice. Oatmeal. Quinoa. Bulgur. Whole-grain and low-sodium cereals. Pita bread. Low-fat, low-sodium crackers. Whole-wheat flour tortillas. Vegetables Fresh or frozen vegetables (raw, steamed, roasted, or grilled). Low-sodium or reduced-sodium tomato and vegetable juice. Low-sodium or reduced-sodium tomato sauce and tomato paste. Low-sodium or reduced-sodium canned vegetables. Fruits All fresh, dried, or frozen fruit. Canned fruit in natural juice (without  added sugar). Meat and other protein foods Skinless chicken or turkey. Ground chicken or turkey. Pork with fat trimmed off. Fish and seafood. Egg whites. Dried beans, peas, or lentils. Unsalted nuts, nut butters, and seeds. Unsalted canned beans. Lean cuts of beef with fat trimmed off. Low-sodium, lean deli meat. Dairy Low-fat (1%) or fat-free (skim) milk. Fat-free, low-fat, or reduced-fat cheeses. Nonfat, low-sodium ricotta or cottage cheese. Low-fat or nonfat yogurt. Low-fat, low-sodium cheese. Fats and oils Soft margarine without trans fats. Vegetable oil. Low-fat, reduced-fat, or light mayonnaise and salad dressings (reduced-sodium). Canola, safflower, olive, soybean, and sunflower oils. Avocado. Seasoning and other foods Herbs. Spices. Seasoning mixes without salt. Unsalted popcorn and pretzels. Fat-free sweets. What foods are not recommended? The items listed may not be a complete list. Talk with your dietitian about what dietary choices are best for you. Grains Baked goods made with fat, such as croissants, muffins, or some breads. Dry pasta or rice meal packs. Vegetables Creamed or fried vegetables. Vegetables in a cheese sauce. Regular canned vegetables (not low-sodium or reduced-sodium). Regular canned tomato sauce and paste (not low-sodium or reduced-sodium). Regular tomato and vegetable juice (not low-sodium or reduced-sodium). Pickles. Olives. Fruits Canned fruit in a light or heavy syrup. Fried fruit. Fruit in cream or butter sauce. Meat and other protein foods Fatty cuts of meat. Ribs. Fried meat. Bacon. Sausage. Bologna and other processed lunch meats. Salami. Fatback. Hotdogs. Bratwurst. Salted nuts and seeds. Canned beans with added salt. Canned or smoked fish. Whole eggs or egg yolks. Chicken or turkey with skin. Dairy Whole or 2% milk, cream, and half-and-half. Whole or full-fat cream cheese. Whole-fat or sweetened yogurt. Full-fat cheese. Nondairy creamers. Whipped toppings.  Processed cheese and cheese spreads. Fats and oils Butter. Stick margarine. Lard. Shortening. Ghee. Bacon fat. Tropical oils, such as coconut, palm kernel, or palm oil. Seasoning and other foods Salted popcorn and pretzels. Onion salt, garlic salt, seasoned salt, table salt, and sea salt. Worcestershire sauce. Tartar sauce. Barbecue sauce. Teriyaki sauce. Soy sauce, including reduced-sodium. Steak sauce. Canned and packaged gravies. Fish sauce. Oyster sauce. Cocktail sauce. Horseradish that you find on the shelf. Ketchup. Mustard. Meat flavorings and tenderizers. Bouillon cubes. Hot sauce and Tabasco sauce. Premade or packaged marinades. Premade or packaged taco seasonings. Relishes. Regular salad dressings. Where to find more information:  National Heart, Lung, and Blood Institute: www.nhlbi.nih.gov  American Heart Association: www.heart.org Summary  The DASH eating plan is a healthy eating plan that has been shown to reduce high blood pressure (hypertension). It may also reduce your risk for type 2 diabetes, heart disease, and stroke.  With the DASH eating plan, you should limit salt (sodium) intake to 2,300 mg a day. If you have hypertension, you may need to reduce your sodium intake to 1,500 mg a day.  When on the DASH eating plan, aim to eat more fresh fruits and vegetables, whole grains, lean proteins, low-fat dairy, and heart-healthy fats.  Work with your health care provider or diet and nutrition specialist (dietitian) to adjust your eating plan to your individual   calorie needs. This information is not intended to replace advice given to you by your health care provider. Make sure you discuss any questions you have with your health care provider. Document Released: 08/02/2011 Document Revised: 08/06/2016 Document Reviewed: 08/06/2016 Elsevier Interactive Patient Education  2018 Elsevier Inc.  

## 2018-03-31 NOTE — Assessment & Plan Note (Signed)
con't diet  Check labs 

## 2018-04-04 ENCOUNTER — Other Ambulatory Visit: Payer: Self-pay | Admitting: *Deleted

## 2018-04-04 DIAGNOSIS — I10 Essential (primary) hypertension: Secondary | ICD-10-CM

## 2018-04-04 DIAGNOSIS — E1151 Type 2 diabetes mellitus with diabetic peripheral angiopathy without gangrene: Secondary | ICD-10-CM

## 2018-04-04 DIAGNOSIS — E1165 Type 2 diabetes mellitus with hyperglycemia: Secondary | ICD-10-CM | POA: Insufficient documentation

## 2018-04-04 DIAGNOSIS — E785 Hyperlipidemia, unspecified: Secondary | ICD-10-CM

## 2018-04-04 MED ORDER — FENOFIBRATE 160 MG PO TABS: 160.0000 mg | ORAL_TABLET | Freq: Every day | ORAL | 2 refills | Status: DC

## 2018-05-05 ENCOUNTER — Encounter: Payer: Self-pay | Admitting: Family Medicine

## 2018-05-06 ENCOUNTER — Other Ambulatory Visit: Payer: Self-pay | Admitting: Family Medicine

## 2018-05-06 DIAGNOSIS — Z3009 Encounter for other general counseling and advice on contraception: Secondary | ICD-10-CM

## 2018-05-08 ENCOUNTER — Encounter: Payer: Self-pay | Admitting: Cardiovascular Disease

## 2018-05-18 ENCOUNTER — Other Ambulatory Visit: Payer: Self-pay | Admitting: Cardiology

## 2018-05-20 NOTE — Progress Notes (Signed)
Chief Complaint  Patient presents with  . Follow-up    CAD   History of Present Illness: 47 yo male with history of CAD and HTN who is here today for follow up. He was admitted to [facility] November 2018 with an inferior STEMI secondary to an occluded RCA. Two drug eluting stents were placed in the RCA. Staged PCI of the intermediate branch with placement of a drug eluting stent. During the staged PCI, he was found to have occlusion of the mid RCA stents, likely due to stent thrombosis from high thrombus burden and poor distal runoff due to embolization. Echo November 2018 with LVEF=60-65%, mild MR. He did well following his MI and completed cardiac rehab. BP and HR low in January 2019 at rehab so Coreg dose was lowered and Norvasc was stopped.   He is here today for follow up. The patient denies any dyspnea, palpitations, lower extremity edema, orthopnea, PND, dizziness, near syncope or syncope. He has rare chest pains at rest. No exertional chest pain.   Primary Care Physician: Carollee Herter, Alferd Apa, DO  Past Medical History:  Diagnosis Date  . CAD (coronary artery disease)   . Hypertension    10 years ago--treated with medication    Past Surgical History:  Procedure Laterality Date  . CORONARY STENT INTERVENTION N/A 07/23/2017   Procedure: CORONARY STENT INTERVENTION;  Surgeon: Burnell Blanks, MD;  Location: [place] [facility];  Service: Cardiovascular;  Laterality: N/A;  . CORONARY/GRAFT ACUTE MI REVASCULARIZATION N/A 07/21/2017   Procedure: Coronary/Graft Acute MI Revascularization;  Surgeon: Burnell Blanks, MD;  Location: [place] [facility];  Service: Cardiovascular;  Laterality: N/A;  . KNEE ARTHROSCOPY    . LEFT HEART CATH AND CORONARY ANGIOGRAPHY N/A 07/21/2017   Procedure: LEFT HEART CATH AND CORONARY ANGIOGRAPHY;  Surgeon: Burnell Blanks, MD;  Location: [place] [facility];  Service: Cardiovascular;  Laterality: N/A;  . LEFT HEART CATH AND CORONARY  ANGIOGRAPHY N/A 07/23/2017   Procedure: LEFT HEART CATH AND CORONARY ANGIOGRAPHY;  Surgeon: Burnell Blanks, MD;  Location: [place] [facility];  Service: Cardiovascular;  Laterality: N/A;    Current Outpatient Medications  Medication Sig Dispense Refill  . aspirin 81 MG chewable tablet Chew 1 tablet (81 mg total) by mouth daily.    Marland Kitchen atorvastatin (LIPITOR) 80 MG tablet TAKE 1 TABLET (80 MG TOTAL) BY MOUTH DAILY AT 6 PM 90 tablet 2  . blood glucose meter kit and supplies KIT Dispense based on patient and insurance preference. Use up to four times daily as directed. (FOR ICD-9 250.00, 250.01). 1 each 0  . carvedilol (COREG) 12.5 MG tablet Take 1 tablet (12.5 mg total) by mouth 2 (two) times daily with a meal. 60 tablet 6  . fenofibrate 160 MG tablet Take 1 tablet (160 mg total) by mouth daily. 30 tablet 2  . nitroGLYCERIN (NITROSTAT) 0.4 MG SL tablet Place 1 tablet (0.4 mg total) under the tongue every 5 (five) minutes as needed. 25 tablet 2  . clopidogrel (PLAVIX) 75 MG tablet Take 1 tablet (75 mg total) by mouth daily. 90 tablet 3   No current facility-administered medications for this visit.     No Known Allergies  Social History   Socioeconomic History  . Marital status: Married    Spouse name: Not on file  . Number of children: Not on file  . Years of education: Not on file  . Highest education level: Not on file  Occupational History  .  Occupation: self employed    Fish farm manager: [employer]  Social Needs  . Financial resource strain: Not on file  . Food insecurity:    Worry: Not on file    Inability: Not on file  . Transportation needs:    Medical: Not on file    Non-medical: Not on file  Tobacco Use  . Smoking status: Never Smoker  . Smokeless tobacco: Never Used  Substance and Sexual Activity  . Alcohol use: No  . Drug use: No  . Sexual activity: Yes    Partners: Female  Lifestyle  . Physical activity:    Days per week: Not on file    Minutes per session: Not  on file  . Stress: Not on file  Relationships  . Social connections:    Talks on phone: Not on file    Gets together: Not on file    Attends religious service: Not on file    Active member of club or organization: Not on file    Attends meetings of clubs or organizations: Not on file    Relationship status: Not on file  . Intimate partner violence:    Fear of current or ex partner: Not on file    Emotionally abused: Not on file    Physically abused: Not on file    Forced sexual activity: Not on file  Other Topics Concern  . Not on file  Social History Narrative  . Not on file    Family History  Problem Relation Age of Onset  . Hypertension Mother   . Prostate cancer Father   . Hypertension Father   . Hypertension Maternal Grandmother   . Hypertension Maternal Grandfather   . Heart failure Paternal Grandmother     Review of Systems:  As stated in the HPI and otherwise negative.   BP 122/84   Pulse (!) 58   Ht 6' (1.829 m)   Wt 237 lb 12.8 oz (107.9 kg)   SpO2 96%   BMI 32.25 kg/m   Physical Examination:  General: Well developed, well nourished, NAD  HEENT: OP clear, mucus membranes moist  SKIN: warm, dry. No rashes. Neuro: No focal deficits  Musculoskeletal: Muscle strength 5/5 all ext  Psychiatric: Mood and affect normal  Neck: No JVD, no carotid bruits, no thyromegaly, no lymphadenopathy.  Lungs:Clear bilaterally, no wheezes, rhonci, crackles Cardiovascular: Regular rate and rhythm. No murmurs, gallops or rubs. Abdomen:Soft. Bowel sounds present. Non-tender.  Extremities: No lower extremity edema. Pulses are 2 + in the bilateral DP/PT.  Cardiac cath 07/21/17:  Mid RCA lesion is 100% stenosed.  Acute Mrg lesion is 50% stenosed.  A drug-eluting stent was successfully placed using a STENT SYNERGY DES 3X24.  Post intervention, there is a 0% residual stenosis.  Dist RCA lesion is 80% stenosed.  A drug-eluting stent was successfully placed using a STENT  SYNERGY DES 2.75X24.  Post intervention, there is a 0% residual stenosis.  Prox Cx to Mid Cx lesion is 30% stenosed.  Ramus lesion is 90% stenosed.  Ost Ramus lesion is 30% stenosed.  Ost 1st Diag lesion is 50% stenosed.  Prox LAD to Mid LAD lesion is 20% stenosed.  The left ventricular systolic function is normal.  LV end diastolic pressure is normal.  The left ventricular ejection fraction is greater than 65% by visual estimate.  There is no mitral valve regurgitation.  Echo 07/22/17: - Left ventricle: The cavity size was normal. There was moderate   concentric hypertrophy. Systolic function was  normal. The   estimated ejection fraction was in the range of 60% to 65%. Wall   motion was normal; there were no regional wall motion   abnormalities. Doppler parameters are consistent with abnormal   left ventricular relaxation (grade 1 diastolic dysfunction). - Aortic valve: Transvalvular velocity was within the normal range.   There was no stenosis. There was mild regurgitation. Valve area   (Vmax): 1.94 cm^2. - Mitral valve: Transvalvular velocity was within the normal range.   There was no evidence for stenosis. There was no regurgitation.   Valve area by pressure half-time: 1.71 cm^2. - Right ventricle: The cavity size was normal. Wall thickness was   normal. Systolic function was normal. - Atrial septum: No defect or patent foramen ovale was identified. - Tricuspid valve: There was no regurgitation. - Pericardium, extracardiac: There was a left pleural effusion.  EKG:  EKG is ordered today. The ekg ordered today demonstrates Sinus brady, rate 58 bpm. Diffuse T wave flattening  Recent Labs: 07/24/2017: Hemoglobin 12.6; Platelets 254 03/31/2018: ALT 25; BUN 13; Creatinine, Ser 1.38; Potassium 4.3; Sodium 137   Lipid Panel    Component Value Date/Time   CHOL 155 03/31/2018 0904   CHOL 133 09/23/2017 1113   TRIG 219.0 (H) 03/31/2018 0904   HDL 28.70 (L) 03/31/2018  0904   HDL 36 (L) 09/23/2017 1113   CHOLHDL 5 03/31/2018 0904   VLDL 43.8 (H) 03/31/2018 0904   LDLCALC 83 09/23/2017 1113   LDLDIRECT 93.0 03/31/2018 0904     Wt Readings from Last 3 Encounters:  05/21/18 237 lb 12.8 oz (107.9 kg)  03/31/18 226 lb 9.6 oz (102.8 kg)  10/02/17 223 lb 12.8 oz (101.5 kg)     Other studies Reviewed: Additional studies/ records that were reviewed today include: . Review of the above records demonstrates:    Assessment and Plan:   1. CAD without angina: He is doing well with no chest pain. Will continue ASA, Brilinta, statin and beta blocker. Will change Brilinta to Plavix 75 mg daily today. .    2. HTN: BP is controlled. No changes today.   3. HLD: Followed in primary care. Goal LDL less than 70. Continue statin. We have discussed his diet today and exercise.   Current medicines are reviewed at length with the patient today.  The patient does not have concerns regarding medicines.  The following changes have been made:  no change  Labs/ tests ordered today include:   Orders Placed This Encounter  Procedures  . EKG 12-Lead     Disposition:   FU with me in 12 months   Signed, Lauree Chandler, MD 05/21/2018 8:57 AM    [facility] [address], [place], [place]  [address] Phone: [phone]; Fax: [phone]

## 2018-05-21 ENCOUNTER — Ambulatory Visit (INDEPENDENT_AMBULATORY_CARE_PROVIDER_SITE_OTHER): Payer: BLUE CROSS/BLUE SHIELD | Admitting: Cardiovascular Disease

## 2018-05-21 ENCOUNTER — Encounter: Payer: Self-pay | Admitting: Cardiovascular Disease

## 2018-05-21 VITALS — BP 122/84 | HR 58 | Ht 72.0 in | Wt 237.8 lb

## 2018-05-21 DIAGNOSIS — E785 Hyperlipidemia, unspecified: Secondary | ICD-10-CM

## 2018-05-21 DIAGNOSIS — I10 Essential (primary) hypertension: Secondary | ICD-10-CM

## 2018-05-21 DIAGNOSIS — I2510 Atherosclerotic heart disease of native coronary artery without angina pectoris: Secondary | ICD-10-CM

## 2018-05-21 MED ORDER — CLOPIDOGREL BISULFATE 75 MG PO TABS: 75.0000 mg | ORAL_TABLET | Freq: Every day | ORAL | 3 refills | Status: DC

## 2018-05-21 NOTE — Patient Instructions (Signed)
Medication Instructions:  Your physician has recommended you make the following change in your medication:  Stop Brilinta. Start Clopidogrel 75 mg by mouth daily.    Labwork: none  Testing/Procedures: none  Follow-Up: Your physician recommends that you schedule a follow-up appointment in: 12 months. Please call our office in about 8 months to schedule this appointment    Any Other Special Instructions Will Be Listed Below (If Applicable).     If you need a refill on your cardiac medications before your next appointment, please call your pharmacy.

## 2018-06-15 ENCOUNTER — Other Ambulatory Visit: Payer: Self-pay | Admitting: Cardiology

## 2018-06-17 NOTE — Telephone Encounter (Signed)
Patient Instructions by Dossie Arbour, RN at 05/21/2018 8:20 AM  Author: Dossie Arbour, RN Author Type: Registered Nurse Filed: 05/21/2018 8:46 AM  Note Status: Signed Cosign: Cosign Not Required Encounter Date: 05/21/2018  Editor: Dossie Arbour, RN (Registered Nurse)    Medication Instructions:  Your physician has recommended you make the following change in your medication:  Stop Brilinta. Start Clopidogrel 75 mg by mouth daily.

## 2018-06-19 ENCOUNTER — Other Ambulatory Visit: Payer: Self-pay | Admitting: Family Medicine

## 2018-06-19 ENCOUNTER — Encounter: Payer: Self-pay | Admitting: Family Medicine

## 2018-06-26 DIAGNOSIS — Z3009 Encounter for other general counseling and advice on contraception: Secondary | ICD-10-CM | POA: Diagnosis not present

## 2018-06-26 NOTE — Telephone Encounter (Signed)
It is diet controlled dm right now ---- if he would like a meter we can get him in for dm ed

## 2018-07-11 ENCOUNTER — Other Ambulatory Visit: Payer: Self-pay | Admitting: Cardiology

## 2018-07-11 MED ORDER — CARVEDILOL 12.5 MG PO TABS: 12.5000 mg | ORAL_TABLET | Freq: Two times a day (BID) | ORAL | 3 refills | Status: DC

## 2018-07-16 ENCOUNTER — Other Ambulatory Visit: Payer: Self-pay | Admitting: Family Medicine

## 2018-08-01 DIAGNOSIS — Z302 Encounter for sterilization: Secondary | ICD-10-CM | POA: Diagnosis not present

## 2018-10-10 ENCOUNTER — Other Ambulatory Visit: Payer: Self-pay | Admitting: Cardiology

## 2018-10-20 ENCOUNTER — Other Ambulatory Visit: Payer: Self-pay | Admitting: Cardiology

## 2018-10-20 ENCOUNTER — Encounter: Payer: Self-pay | Admitting: Family Medicine

## 2018-10-21 ENCOUNTER — Telehealth: Payer: Self-pay

## 2018-10-21 ENCOUNTER — Encounter (HOSPITAL_BASED_OUTPATIENT_CLINIC_OR_DEPARTMENT_OTHER): Payer: Self-pay | Admitting: *Deleted

## 2018-10-21 ENCOUNTER — Emergency Department (HOSPITAL_BASED_OUTPATIENT_CLINIC_OR_DEPARTMENT_OTHER)
Admission: EM | Admit: 2018-10-21 | Discharge: 2018-10-21 | Disposition: A | Discharge status: Home / Self Care | Payer: BLUE CROSS/BLUE SHIELD | Attending: Emergency Medicine | Admitting: Emergency Medicine

## 2018-10-21 ENCOUNTER — Other Ambulatory Visit: Payer: Self-pay

## 2018-10-21 ENCOUNTER — Ambulatory Visit: Payer: Self-pay | Admitting: Family Medicine

## 2018-10-21 ENCOUNTER — Emergency Department (HOSPITAL_BASED_OUTPATIENT_CLINIC_OR_DEPARTMENT_OTHER): Payer: BLUE CROSS/BLUE SHIELD

## 2018-10-21 DIAGNOSIS — Z7902 Long term (current) use of antithrombotics/antiplatelets: Secondary | ICD-10-CM | POA: Insufficient documentation

## 2018-10-21 DIAGNOSIS — R0789 Other chest pain: Secondary | ICD-10-CM | POA: Diagnosis not present

## 2018-10-21 DIAGNOSIS — I10 Essential (primary) hypertension: Secondary | ICD-10-CM | POA: Insufficient documentation

## 2018-10-21 DIAGNOSIS — Z7982 Long term (current) use of aspirin: Secondary | ICD-10-CM | POA: Diagnosis not present

## 2018-10-21 DIAGNOSIS — I252 Old myocardial infarction: Secondary | ICD-10-CM | POA: Diagnosis not present

## 2018-10-21 DIAGNOSIS — Z79899 Other long term (current) drug therapy: Secondary | ICD-10-CM | POA: Insufficient documentation

## 2018-10-21 DIAGNOSIS — I2510 Atherosclerotic heart disease of native coronary artery without angina pectoris: Secondary | ICD-10-CM | POA: Diagnosis not present

## 2018-10-21 DIAGNOSIS — E119 Type 2 diabetes mellitus without complications: Secondary | ICD-10-CM | POA: Insufficient documentation

## 2018-10-21 DIAGNOSIS — R001 Bradycardia, unspecified: Secondary | ICD-10-CM | POA: Diagnosis not present

## 2018-10-21 DIAGNOSIS — Z955 Presence of coronary angioplasty implant and graft: Secondary | ICD-10-CM | POA: Insufficient documentation

## 2018-10-21 DIAGNOSIS — R079 Chest pain, unspecified: Secondary | ICD-10-CM | POA: Diagnosis not present

## 2018-10-21 LAB — BASIC METABOLIC PANEL
Anion gap: 7 (ref 5–15)
BUN: 12 mg/dL (ref 6–20)
CO2: 23 mmol/L (ref 22–32)
Calcium: 9.2 mg/dL (ref 8.9–10.3)
Chloride: 105 mmol/L (ref 98–111)
Creatinine, Ser: 1.49 mg/dL — ABNORMAL HIGH (ref 0.61–1.24)
GFR calc Af Amer: >60 mL/min (ref >60)
GFR calc non Af Amer: 55 mL/min — ABNORMAL LOW (ref >60)
Glucose, Bld: 93 mg/dL (ref 70–99)
Potassium: 3.6 mmol/L (ref 3.5–5.1)
Sodium: 135 mmol/L (ref 135–145)

## 2018-10-21 LAB — CBC
HCT: 37.8 % — ABNORMAL LOW (ref 39.0–52.0)
Hemoglobin: 11.8 g/dL — ABNORMAL LOW (ref 13.0–17.0)
MCH: 27.8 pg (ref 26.0–34.0)
MCHC: 31.2 g/dL (ref 30.0–36.0)
MCV: 89.2 fL (ref 80.0–100.0)
Platelets: 294 10*3/uL (ref 150–400)
RBC: 4.24 MIL/uL (ref 4.22–5.81)
RDW: 13.4 % (ref 11.5–15.5)
WBC: 5.3 10*3/uL (ref 4.0–10.5)
nRBC: 0 % (ref 0.0–0.2)

## 2018-10-21 LAB — TROPONIN I: Troponin I: <0.03 ng/mL (ref <0.03)

## 2018-10-21 NOTE — Telephone Encounter (Signed)
See my separate triage notes on 10/21/2018.  I spoke with the pt and he has agreed to go to the ED due to having chest pains.

## 2018-10-21 NOTE — ED Triage Notes (Addendum)
Pt states he was playing basketball Sunday after playing basketball began having chest pain and also had pain yesterday. Took NTG last night with relief. Reports he had a heart attack in 2018. Denies pain at this time and states he feels good today but was told by his doctor to get checked out

## 2018-10-21 NOTE — Telephone Encounter (Signed)
Called pt wife and reiterated that he needs to go to the ER NOW --- not in a few hours Also called pt at work and left a message

## 2018-10-21 NOTE — Telephone Encounter (Signed)
Mychart message sent to [facility] triage.  

## 2018-10-21 NOTE — Telephone Encounter (Signed)
Message   This is Frak Paolini wife   He has not been feeling well and has had to use his nitroglycerin can he get in for a check up? He asked for a Friday around lunch or after.   Please feel free to call me [phone]     See mychart message above- please call to triage. Thank you.

## 2018-10-21 NOTE — Telephone Encounter (Signed)
Wife, Chad Chase called in telling me her husband was having chest pain.   He is using his nitroglycerin for the chest pain.   He had pain for the last 2 days.    He had a heart attack in Nov. 2018.   He is refusing to go to the ED.   Chad Chase gave me his cell number because he is at work now.  I called and triaged him.   See triage notes below.   He admitted to having chest pains on Sunday after playing basketball with his son.   He had chest pain all day yesterday (Monday).    He said the pain was similar to when he had prior MI.  "But I feel fine today".   "It was from playing basketball".   "I suspected it was my heart but I feel fine today".   "I'm not going to the ED".    After talking with him further about the importance of going  Especially with his history he finally agreed to go to the ED when he gets off of work in a couple of hours.   "I'm with a client right now and can't go".    I let him know how important it was for him to go.   He did say,   "I will go but it will be after work".    When asked which ED he would go to he said the "one there below your office".   I encouraged him to go to [facility]  Due to his history.   If they admit him or need to do any interventions they will have to transfer him from the ED at the [facility] location on [address].   He agreed to go to [facility] however I did let him know if he preferred he could go the the [facility] just please go be evaluated. He finally agreed to go to the ED after work today.  I called the flow coordinator at [employer] at [facility] and made her aware of the situation while I still had Chad Chase on hold.    They also instructed him to go to the ED.   I relayed that message to him.  I have sent these triage notes to Dr. Maryln Gottron nurse pool. Reason for Disposition . [1] Chest pain lasts > 5 minutes AND [2] history of heart disease  (i.e., heart attack, bypass surgery, angina, angioplasty, CHF; not  just a heart murmur)  Answer Assessment - Initial Assessment Questions 1. LOCATION: "Where does it hurt?"       Chad Chase, wife called in saying her husband is using his NTG for chest pains.  He had a heart attack Nov. 2018.   Husband is giving me a fit.   Will not go to the doctor.    Wife said his chest was hurting last night.   Took a NTG and he c/o being cold.   That is very unusual for him.    This morning he is fine.   He has been sleeping more lately.    Wife is going to call her husband and let him know I am going to call him so he will answer the phone and not think it's a robo call.  I am going to talk with him directly, if possible.   Wife did inform me he will refuse to go to the ED.   He will come into the office.  I called him on his cell phone:  I feel a lot better today actually.   I think I over exerted my self  Over the weekend.   I was having chest pains pretty bad yesterday and last night.    The NTG did relieve help pain.       2. RADIATION: "Does the pain go anywhere else?" (e.g., into neck, jaw, arms, back)     I did have pain in my upper left back area.   The chest pain was in the left side of my chest.   The pain was similar to when I had the heart attack in 2018.    The chest pain was Sunday night and yesterday.   I know it was from playing basketball with my son.    3. ONSET: "When did the chest pain begin?" (Minutes, hours or days)      Sunday and yesterday.    I really think I'm fine. 4. PATTERN "Does the pain come and go, or has it been constant since it started?"  "Does it get worse with exertion?"      Intermittent 5. DURATION: "How long does it last" (e.g., seconds, minutes, hours)     Lasted continuous all day yesterday.   6. SEVERITY: "How bad is the pain?"  (e.g., Scale 1-10; mild, moderate, or severe)    - MILD (1-3): doesn't interfere with normal activities     - MODERATE (4-7): interferes with normal activities or awakens from sleep    - SEVERE (8-10):  excruciating pain, unable to do any normal activities       6 on scale.   7. CARDIAC RISK FACTORS: "Do you have any history of heart problems or risk factors for heart disease?" (e.g., prior heart attack, angina; high blood pressure, diabetes, being overweight, high cholesterol, smoking, or strong family history of heart disease)     Heart attack 2018    I had 3 stents put in. 8. PULMONARY RISK FACTORS: "Do you have any history of lung disease?"  (e.g., blood clots in lung, asthma, emphysema, birth control pills)     No 9. CAUSE: "What do you think is causing the chest pain?"     I think it was my heart.   But I was playing basketball. 10. OTHER SYMPTOMS: "Do you have any other symptoms?" (e.g., dizziness, nausea, vomiting, sweating, fever, difficulty breathing, cough)       Denies any other symptoms 11. PREGNANCY: "Is there any chance you are pregnant?" "When was your last menstrual period?"       N/A  Protocols used: CHEST PAIN-A-AH

## 2018-10-21 NOTE — ED Provider Notes (Signed)
[facility] Provider Note   CSN: [id number] Arrival date & time: 10/21/18  1821    History   Chief Complaint Chief Complaint  Patient presents with  . Follow-up    HPI Chad Chase is a 48 y.o. male.     48yo M w/ PMH including CAD s/p MI, T2DM, HTN, HLD who p/w chest pain.  2 evenings ago, the patient went outside and played basketball with his kids.  He noted that he fatigued easily when he played with them but he assumed it was because he is not very active and does not exercise often.  He denies any pain while he was playing basketball.  Several hours later as he was getting ready for bed, he noticed some soreness in his central chest as well as his upper back.  He thought that it was from the exercise and he went on to bed.  He continued to have this pain throughout the day yesterday.  The pain was nonexertional, nonradiating, and not associated with any shortness of breath, nausea/vomiting, or diaphoresis.  Eventually, last night he took a nitroglycerin which seemed to improve the pain before he went to bed and this morning when he woke up his pain had resolved.  He has not had any recurrence of pain today.  They spoke with his outpatient providers who encouraged him to come to the ED for evaluation today.  Currently he denies any complaints.  He denies any recent illness including no fevers, cough/cold symptoms, recent travel, leg swelling/pain, history of cancer, or history of blood clots.  He is compliant with his medications.  No tobacco, alcohol, or drug use.  The history is provided by the patient.    Past Medical History:  Diagnosis Date  . CAD (coronary artery disease)   . Hypertension    10 years ago--treated with medication    Patient Active Problem List   Diagnosis Date Noted  . Type II diabetes mellitus with peripheral circulatory disorder ([place]) 04/04/2018  . Diet-controlled diabetes mellitus ([place]) 12/23/2017  . Viral upper  respiratory tract infection 09/24/2017  . Hypertension 07/24/2017  . Hyperlipidemia 07/24/2017  . Coronary artery disease involving native coronary artery of native heart with unstable angina pectoris ([place])   . Acute ST elevation myocardial infarction (STEMI) of inferior wall ([facility])   . SKIN RASH 08/15/2010  . SKIN LESION 01/28/2008  . ARTHROSCOPY, LEFT KNEE, HX OF 01/28/2008    Past Surgical History:  Procedure Laterality Date  . CORONARY STENT INTERVENTION N/A 07/23/2017   Procedure: CORONARY STENT INTERVENTION;  Surgeon: Burnell Blanks, MD;  Location: [facility];  Service: Cardiovascular;  Laterality: N/A;  . CORONARY/GRAFT ACUTE MI REVASCULARIZATION N/A 07/21/2017   Procedure: Coronary/Graft Acute MI Revascularization;  Surgeon: Burnell Blanks, MD;  Location: [facility];  Service: Cardiovascular;  Laterality: N/A;  . KNEE ARTHROSCOPY    . LEFT HEART CATH AND CORONARY ANGIOGRAPHY N/A 07/21/2017   Procedure: LEFT HEART CATH AND CORONARY ANGIOGRAPHY;  Surgeon: Burnell Blanks, MD;  Location: [facility];  Service: Cardiovascular;  Laterality: N/A;  . LEFT HEART CATH AND CORONARY ANGIOGRAPHY N/A 07/23/2017   Procedure: LEFT HEART CATH AND CORONARY ANGIOGRAPHY;  Surgeon: Burnell Blanks, MD;  Location: [facility];  Service: Cardiovascular;  Laterality: N/A;        Home Medications    Prior to Admission medications   Medication Sig Start Date End Date Taking? Authorizing Provider  aspirin 81 MG chewable tablet Chew 1 tablet (81 mg total) by mouth daily. 07/25/17  Yes Reino Bellis B, NP  atorvastatin (LIPITOR) 80 MG tablet TAKE 1 TABLET (80 MG TOTAL) BY MOUTH DAILY AT 6 PM 10/10/18  Yes Reino Bellis B, NP  carvedilol (COREG) 12.5 MG tablet Take 1 tablet (12.5 mg total) by mouth 2 (two) times daily with a meal. 07/11/18  Yes Burnell Blanks, MD  clopidogrel (PLAVIX) 75 MG tablet Take 1 tablet (75 mg total) by  mouth daily. 05/21/18  Yes Burnell Blanks, MD  fenofibrate 160 MG tablet TAKE 1 TABLET BY MOUTH EVERY DAY 07/17/18  Yes Roma Schanz R, DO  nitroGLYCERIN (NITROSTAT) 0.4 MG SL tablet PLACE 1 TABLET (0.4 MG TOTAL) UNDER THE TONGUE EVERY 5 (FIVE) MINUTES AS NEEDED 10/21/18  Yes Cheryln Manly, NP  blood glucose meter kit and supplies KIT Dispense based on patient and insurance preference. Use up to four times daily as directed. (FOR ICD-9 250.00, 250.01). 03/31/18   Ann Held, DO    Family History Family History  Problem Relation Age of Onset  . Hypertension Mother   . Prostate cancer Father   . Hypertension Father   . Hypertension Maternal Grandmother   . Hypertension Maternal Grandfather   . Heart failure Paternal Grandmother     Social History Social History   Tobacco Use  . Smoking status: Never Smoker  . Smokeless tobacco: Never Used  Substance Use Topics  . Alcohol use: Not Currently  . Drug use: No     Allergies   Patient has no known allergies.   Review of Systems Review of Systems All other systems reviewed and are negative except that which was mentioned in HPI   Physical Exam Updated Vital Signs BP (!) 167/108 (BP Location: Left Arm)   Pulse (!) 54   Temp 98.3 F (36.8 C) (Oral)   Resp 18   Ht 6' (1.829 m)   Wt 106.6 kg   SpO2 99%   BMI 31.87 kg/m   Physical Exam Vitals signs and nursing note reviewed.  Constitutional:      General: He is not in acute distress.    Appearance: He is well-developed.  HENT:     Head: Normocephalic and atraumatic.  Eyes:     Conjunctiva/sclera: Conjunctivae normal.     Pupils: Pupils are equal, round, and reactive to light.  Neck:     Musculoskeletal: Neck supple.  Cardiovascular:     Rate and Rhythm: Regular rhythm. Bradycardia present.     Heart sounds: Normal heart sounds. No murmur.  Pulmonary:     Effort: Pulmonary effort is normal.     Breath sounds: Normal breath sounds.    Abdominal:     General: Bowel sounds are normal. There is no distension.     Palpations: Abdomen is soft.     Tenderness: There is no abdominal tenderness.  Musculoskeletal:     Right lower leg: No edema.     Left lower leg: No edema.  Skin:    General: Skin is warm and dry.  Neurological:     Mental Status: He is alert and oriented to person, place, and time.     Comments: Fluent speech  Psychiatric:        Judgment: Judgment normal.      ED Treatments / Results  Labs (all labs ordered are listed, but only abnormal results are displayed) Labs Reviewed  BASIC METABOLIC PANEL -  Abnormal; Notable for the following components:      Result Value   Creatinine, Ser 1.49 (*)    GFR calc non Af Amer 55 (*)    All other components within normal limits  CBC - Abnormal; Notable for the following components:   Hemoglobin 11.8 (*)    HCT 37.8 (*)    All other components within normal limits  TROPONIN I    EKG EKG Interpretation  Date/Time:  Tuesday October 21 2018 18:37:56 EST Ventricular Rate:  58 PR Interval:  186 QRS Duration: 92 QT Interval:  420 QTC Calculation: 412 R Axis:   17 Text Interpretation:  Sinus bradycardia Nonspecific T wave abnormality Abnormal ECG since previous tracing, rate slower Confirmed by Theotis Burrow [phone]) on 10/21/2018 7:26:32 PM   Radiology Dg Chest 2 View  Result Date: 10/21/2018 CLINICAL DATA:  Chest pain yesterday while playing basketball. EXAM: CHEST - 2 VIEW COMPARISON:  PA and lateral chest 07/21/2018. FINDINGS: Lungs clear. Heart size normal. No pneumothorax or pleural fluid. No acute bony abnormality. Scoliosis noted. IMPRESSION: No acute disease. Electronically Signed   By: Inge Rise M.D.   On: 10/21/2018 20:06    Procedures Procedures (including critical care time)  Medications Ordered in ED Medications - No data to display   Initial Impression / Assessment and Plan / ED Course  I have reviewed the triage vital signs  and the nursing notes.  Pertinent labs & imaging results that were available during my care of the patient were reviewed by me and considered in my medical decision making (see chart for details).       Well-appearing on exam, denying complaints.  Vital signs notable for hypertension at 167/108 at check-in.  EKG shows sinus bradycardia, no acute ischemic changes compared to previous.  Initial troponin negative.  Creatinine is 1.49 today which is very slightly above baseline.  Encouraged him to stay well-hydrated.  X-ray negative.  He has no risk factors for PE and given reassuring vital signs and no ongoing symptoms, I feel PE is extremely unlikely.  Discussed with cardiologist on-call, Dr. Kalman Shan, who agreed that symptoms sound atypical especially as patient described that moving his left arm certain ways seemed to aggravate the pain.  Because his troponin is undetectable after over 24 hours of chest pain, and he has had 24 hours of no symptoms, Dr. Kalman Shan agreed with plan to discharge home with close cardiology clinic follow-up.  The patient prefers this plan over staying in the hospital.  I have extensively reviewed return precautions with the patient and his wife and they have voiced understanding.  He understands need to report directly to [facility] if symptoms recur; otherwise he will contact cardiology clinic for follow-up appointment.  Final Clinical Impressions(s) / ED Diagnoses   Final diagnoses:  Atypical chest pain    ED Discharge Orders    None       Little, Wenda Overland, MD 10/21/18 2100

## 2018-10-22 ENCOUNTER — Telehealth: Payer: Self-pay | Admitting: *Deleted

## 2018-10-22 NOTE — Telephone Encounter (Signed)
Pt has been scheduled to see Glorious Peach, PA on March 5,2020.  If pt would like sooner appointment he can be scheduled to see Dr. Clifton James on 10/24/18 at 8:20

## 2018-10-22 NOTE — Telephone Encounter (Signed)
Received message that pt needs office visit following recent ED visit.  I placed call to pt to schedule this appointment. Left message to call office.

## 2018-10-23 ENCOUNTER — Encounter: Payer: Self-pay | Admitting: Family Medicine

## 2018-10-24 NOTE — Telephone Encounter (Signed)
Pt needs to come in for bp check and possible adjustment of meds----  We can recheck cbcd, ibc here  Also check ifob

## 2018-10-28 ENCOUNTER — Ambulatory Visit (INDEPENDENT_AMBULATORY_CARE_PROVIDER_SITE_OTHER): Payer: BLUE CROSS/BLUE SHIELD | Admitting: Family Medicine

## 2018-10-28 VITALS — BP 130/86 | HR 55 | Temp 97.8°F | Resp 16 | Ht 72.0 in | Wt 238.2 lb

## 2018-10-28 DIAGNOSIS — I10 Essential (primary) hypertension: Secondary | ICD-10-CM | POA: Diagnosis not present

## 2018-10-28 DIAGNOSIS — D509 Iron deficiency anemia, unspecified: Secondary | ICD-10-CM | POA: Diagnosis not present

## 2018-10-28 DIAGNOSIS — I25110 Atherosclerotic heart disease of native coronary artery with unstable angina pectoris: Secondary | ICD-10-CM

## 2018-10-28 DIAGNOSIS — I2119 ST elevation (STEMI) myocardial infarction involving other coronary artery of inferior wall: Secondary | ICD-10-CM

## 2018-10-28 DIAGNOSIS — E119 Type 2 diabetes mellitus without complications: Secondary | ICD-10-CM

## 2018-10-28 DIAGNOSIS — E785 Hyperlipidemia, unspecified: Secondary | ICD-10-CM

## 2018-10-28 NOTE — Patient Instructions (Signed)
Anemia  Anemia is a condition in which you do not have enough red blood cells or hemoglobin. Hemoglobin is a substance in red blood cells that carries oxygen. When you do not have enough red blood cells or hemoglobin (are anemic), your body cannot get enough oxygen and your organs may not work properly. As a result, you may feel very tired or have other problems. What are the causes? Common causes of anemia include:  Excessive bleeding. Anemia can be caused by excessive bleeding inside or outside the body, including bleeding from the intestine or from periods in women.  Poor nutrition.  Long-lasting (chronic) kidney, thyroid, and liver disease.  Bone marrow disorders.  Cancer and treatments for cancer.  HIV (human immunodeficiency virus) and AIDS (acquired immunodeficiency syndrome).  Treatments for HIV and AIDS.  Spleen problems.  Blood disorders.  Infections, medicines, and autoimmune disorders that destroy red blood cells. What are the signs or symptoms? Symptoms of this condition include:  Minor weakness.  Dizziness.  Headache.  Feeling heartbeats that are irregular or faster than normal (palpitations).  Shortness of breath, especially with exercise.  Paleness.  Cold sensitivity.  Indigestion.  Nausea.  Difficulty sleeping.  Difficulty concentrating. Symptoms may occur suddenly or develop slowly. If your anemia is mild, you may not have symptoms. How is this diagnosed? This condition is diagnosed based on:  Blood tests.  Your medical history.  A physical exam.  Bone marrow biopsy. Your health care provider may also check your stool (feces) for blood and may do additional testing to look for the cause of your bleeding. You may also have other tests, including:  Imaging tests, such as a CT scan or MRI.  Endoscopy.  Colonoscopy. How is this treated? Treatment for this condition depends on the cause. If you continue to lose a lot of blood, you may  need to be treated at a hospital. Treatment may include:  Taking supplements of iron, vitamin S31, or folic acid.  Taking a hormone medicine (erythropoietin) that can help to stimulate red blood cell growth.  Having a blood transfusion. This may be needed if you lose a lot of blood.  Making changes to your diet.  Having surgery to remove your spleen. Follow these instructions at home:  Take over-the-counter and prescription medicines only as told by your health care provider.  Take supplements only as told by your health care provider.  Follow any diet instructions that you were given.  Keep all follow-up visits as told by your health care provider. This is important. Contact a health care provider if:  You develop new bleeding anywhere in the body. Get help right away if:  You are very weak.  You are short of breath.  You have pain in your abdomen or chest.  You are dizzy or feel faint.  You have trouble concentrating.  You have bloody or black, tarry stools.  You vomit repeatedly or you vomit up blood. Summary  Anemia is a condition in which you do not have enough red blood cells or enough of a substance in your red blood cells that carries oxygen (hemoglobin).  Symptoms may occur suddenly or develop slowly.  If your anemia is mild, you may not have symptoms.  This condition is diagnosed with blood tests as well as a medical history and physical exam. Other tests may be needed.  Treatment for this condition depends on the cause of the anemia. This information is not intended to replace advice given to you by  your health care provider. Make sure you discuss any questions you have with your health care provider. Document Released: 09/20/2004 Document Revised: 09/14/2016 Document Reviewed: 09/14/2016 Elsevier Interactive Patient Education  2019 [employer].

## 2018-10-28 NOTE — Progress Notes (Signed)
Patient ID: Chad Chase, male    DOB: [birth year]  Age: 48 y.o. MRN: [id number]    Subjective:  Subjective  HPI Chad Chase presents for f/u er for chest pain    Chest pain had resolved and he has not had any since.  Her was playing basketball and has not done much exercise in a while so he feels it was too much --- he is doing much better and has a regular f/u with [facility]ilogy.   Er found him to be slightly anemic only and advised he f/u here.   Review of Systems  Constitutional: Negative for appetite change, diaphoresis, fatigue and unexpected weight change.  Eyes: Negative for pain, redness and visual disturbance.  Respiratory: Negative for cough, chest tightness, shortness of breath and wheezing.   Cardiovascular: Negative for chest pain, palpitations and leg swelling.  Gastrointestinal: Negative for abdominal distention, abdominal pain, anal bleeding, blood in stool, constipation, diarrhea and nausea.  Endocrine: Negative for cold intolerance, heat intolerance, polydipsia, polyphagia and polyuria.  Genitourinary: Negative for difficulty urinating, dysuria and frequency.  Neurological: Negative for dizziness, light-headedness, numbness and headaches.    History Past Medical History:  Diagnosis Date  . CAD (coronary artery disease)   . Hypertension    10 years ago--treated with medication    He has a past surgical history that includes Knee arthroscopy; Coronary/Graft Acute MI Revascularization (N/A, 07/21/2017); LEFT HEART CATH AND CORONARY ANGIOGRAPHY (N/A, 07/21/2017); CORONARY STENT INTERVENTION (N/A, 07/23/2017); and LEFT HEART CATH AND CORONARY ANGIOGRAPHY (N/A, 07/23/2017).   His family history includes Heart failure in his paternal grandmother; Hypertension in his father, maternal grandfather, maternal grandmother, and mother; Prostate cancer in his father.He reports that he has never smoked. He has never used smokeless tobacco. He reports previous alcohol use. He reports that  he does not use drugs.  Current Outpatient Medications on File Prior to Visit  Medication Sig Dispense Refill  . aspirin 81 MG chewable tablet Chew 1 tablet (81 mg total) by mouth daily.    Marland Kitchen atorvastatin (LIPITOR) 80 MG tablet TAKE 1 TABLET (80 MG TOTAL) BY MOUTH DAILY AT 6 PM 90 tablet 2  . blood glucose meter kit and supplies KIT Dispense based on patient and insurance preference. Use up to four times daily as directed. (FOR ICD-9 250.00, 250.01). 1 each 0  . carvedilol (COREG) 12.5 MG tablet Take 1 tablet (12.5 mg total) by mouth 2 (two) times daily with a meal. 60 tablet 3  . clopidogrel (PLAVIX) 75 MG tablet Take 1 tablet (75 mg total) by mouth daily. 90 tablet 3  . fenofibrate 160 MG tablet TAKE 1 TABLET BY MOUTH EVERY DAY 90 tablet 0  . nitroGLYCERIN (NITROSTAT) 0.4 MG SL tablet PLACE 1 TABLET (0.4 MG TOTAL) UNDER THE TONGUE EVERY 5 (FIVE) MINUTES AS NEEDED 25 tablet 4   No current facility-administered medications on file prior to visit.      Objective:  Objective  Physical Exam Vitals signs and nursing note reviewed.  Constitutional:      General: He is sleeping. He is not in acute distress.    Appearance: He is well-developed.  HENT:     Head: Normocephalic and atraumatic.  Eyes:     Pupils: Pupils are equal, round, and reactive to light.  Neck:     Musculoskeletal: Normal range of motion and neck supple.     Thyroid: No thyromegaly.  Cardiovascular:     Rate and Rhythm: Normal rate and regular rhythm.  Heart sounds: Normal heart sounds. No murmur.  Pulmonary:     Effort: Pulmonary effort is normal. No respiratory distress.     Breath sounds: Normal breath sounds. No wheezing or rales.  Chest:     Chest wall: No tenderness.  Abdominal:     General: There is no distension.     Tenderness: There is no abdominal tenderness.  Musculoskeletal:        General: No tenderness.  Skin:    General: Skin is warm and dry.  Neurological:     Mental Status: He is oriented  to person, place, and time.  Psychiatric:        Behavior: Behavior normal.        Thought Content: Thought content normal.        Judgment: Judgment normal.    BP 130/86 (BP Location: Right Arm, Cuff Size: Large)   Pulse (!) 55   Temp 97.8 F (36.6 C) (Oral)   Resp 16   Ht 6' (1.829 m)   Wt 238 lb 3.2 oz (108 kg)   SpO2 98%   BMI 32.31 kg/m  Wt Readings from Last 3 Encounters:  10/28/18 238 lb 3.2 oz (108 kg)  10/21/18 235 lb (106.6 kg)  05/21/18 237 lb 12.8 oz (107.9 kg)     Lab Results  Component Value Date   WBC 5.3 10/21/2018   HGB 11.8 (L) 10/21/2018   HCT 37.8 (L) 10/21/2018   PLT 294 10/21/2018   GLUCOSE 93 10/21/2018   CHOL 155 03/31/2018   TRIG 219.0 (H) 03/31/2018   HDL 28.70 (L) 03/31/2018   LDLDIRECT 93.0 03/31/2018   LDLCALC 83 09/23/2017   ALT 25 03/31/2018   AST 35 03/31/2018   NA 135 10/21/2018   K 3.6 10/21/2018   CL 105 10/21/2018   CREATININE 1.49 (H) 10/21/2018   BUN 12 10/21/2018   CO2 23 10/21/2018   TSH 1.22 06/21/2016   PSA 0.38 06/21/2016   INR 0.94 07/22/2017   HGBA1C 6.9 (H) 03/31/2018    Dg Chest 2 View  Result Date: 10/21/2018 CLINICAL DATA:  Chest pain yesterday while playing basketball. EXAM: CHEST - 2 VIEW COMPARISON:  PA and lateral chest 07/21/2018. FINDINGS: Lungs clear. Heart size normal. No pneumothorax or pleural fluid. No acute bony abnormality. Scoliosis noted. IMPRESSION: No acute disease. Electronically Signed   By: Inge Rise M.D.   On: 10/21/2018 20:06     Assessment & Plan:  Plan  I am having Chad Chase maintain his aspirin, blood glucose meter kit and supplies, clopidogrel, carvedilol, fenofibrate, atorvastatin, and nitroGLYCERIN.  No orders of the defined types were placed in this encounter.   Problem List Items Addressed This Visit      Unprioritized   Acute ST elevation myocardial infarction (STEMI) of inferior wall [facility])    F/u cardiology Pt had episode cp and was seen in ER -- w/u neg    Found to be anemic       Coronary artery disease involving native coronary artery of native heart with unstable angina pectoris ([place])    con't meds  Per cardiology      Diet-controlled diabetes mellitus ([place])    Lab Results  Component Value Date   HGBA1C 6.9 (H) 03/31/2018    hgba1c acceptable, minimize simple carbs. Increase exercise as tolerated. Continue current meds      Hyperlipidemia    Tolerating statin, encouraged heart healthy diet, avoid trans fats, minimize simple carbs and saturated fats. Increase  exercise as tolerated      Hypertension    Well controlled, no changes to meds. Encouraged heart healthy diet such as the DASH diet and exercise as tolerated.       Iron deficiency anemia - Primary    Recheck labs  Check ifob       Relevant Orders   CBC with Differential/Platelet   IBC + Ferritin   Fecal occult blood, imunochemical([facility])      Follow-up: Return in about 3 months (around 01/28/2019), or if symptoms worsen or fail to improve, for hypertension, hyperlipidemia, diabetes II.  Ann Held, DO

## 2018-10-29 ENCOUNTER — Encounter: Payer: Self-pay | Admitting: Family Medicine

## 2018-10-29 DIAGNOSIS — D509 Iron deficiency anemia, unspecified: Secondary | ICD-10-CM | POA: Insufficient documentation

## 2018-10-29 NOTE — Assessment & Plan Note (Signed)
Well controlled, no changes to meds. Encouraged heart healthy diet such as the DASH diet and exercise as tolerated.  °

## 2018-10-29 NOTE — Assessment & Plan Note (Signed)
con't meds Per cardiology 

## 2018-10-29 NOTE — Assessment & Plan Note (Signed)
Lab Results  Component Value Date   HGBA1C 6.9 (H) 03/31/2018    hgba1c acceptable, minimize simple carbs. Increase exercise as tolerated. Continue current meds

## 2018-10-29 NOTE — Progress Notes (Deleted)
Cardiology Office Note   Date:  10/29/2018   ID:  Chad Chase, DOB 1971/08/22, MRN [id number]  PCP:  Ann Held, DO  Cardiologist: Dr. Angelena Form   No chief complaint on file.   History of Present Illness: Chad Chase is a 48 y.o. male who presents for post ED follow up for chest pain, seen for Dr. Angelena Form.    Chad Chase has a prior hx of CAD and HTN who was last seen by his primary cardiologist on 05/21/2018 for follow up. He was admitted to [facility] 06/2017 with an inferior STEMI secondary to an occluded RCA. Two drug eluting stents were placed in the RCA. Staged PCI of the intermediate branch with placement of a drug eluting stent. During the staged PCI, he was found to have occlusion of the mid RCA stents, likely due to stent thrombosis from high thrombus burden and poor distal runoff due to embolization. Echo November 2018 with LVEF=60-65%, mild MR. He did well following his MI and completed cardiac rehab. BP and HR were found to be moderately low in 08/2017 at rehab so Coreg dose was lowered and Norvasc was stopped.   During his last follow up visit, he denied anginal symptoms, palpitations, LE swelling orthopnea, PND, dizziness or syncope.    Unfortunately he was seen at [facility] on 10/21/2018 after experiencing exertional chest discomfort after playing basketball with his children. Per chart review, he had noted that he fatigued easily when he played with them but he assumed it was because he is not very active and does not exercise often. He denied any pain while he was playing basketball. Several hours later as he was getting ready for bed, he noticed some soreness in his central chest as well as his upper back. He thought that it was from the exercise and he went on to bed.  He continued to have this pain throughout the day yesterday. The pain was nonexertional, nonradiating, and not associated with any shortness of breath, nausea/vomiting, or diaphoresis. Eventually, last  night he took a nitroglycerin which seemed to improve the pain before he went to bed and this morning when he woke up his pain had resolved.  He called his outpatient providers who encouraged him to come to the ED for evaluation.   In the ED, workup was unremarkable from a cardiology perspective. He was noted to be hypertension at 167/108. EKG showed sinus bradycardia with no acute ischemic changes compared to previous tracings. Troponin was negative with greater than 24h of persistent chest pain. Creatinine was noted to be 1.49 which appears to be slightly above baseline. Case discussed with on-call cardiologist who agreed that symptoms sounded atypical especially as patient described that moving his left arm certain ways seemed to aggravate the pain. Given negative workup, he was discharged home with close cardiology clinic follow-up.    Today,      1. CAD without angina: He is doing well with no chest pain. Will continue ASA, Brilinta, statin and beta blocker. Will change Brilinta to Plavix 75 mg daily today. .    2. HTN: BP is controlled. No changes today.   3. HLD: Followed in primary care. Goal LDL less than 70. Continue statin. We have discussed his diet today and exercise.     Past Medical History:  Diagnosis Date  . CAD (coronary artery disease)   . Hypertension    10 years ago--treated with medication    Past Surgical History:  Procedure Laterality  Date  . CORONARY STENT INTERVENTION N/A 07/23/2017   Procedure: CORONARY STENT INTERVENTION;  Surgeon: Chad Blanks, MD;  Location: [place] [facility];  Service: Cardiovascular;  Laterality: N/A;  . CORONARY/GRAFT ACUTE MI REVASCULARIZATION N/A 07/21/2017   Procedure: Coronary/Graft Acute MI Revascularization;  Surgeon: Chad Blanks, MD;  Location: [facility];  Service: Cardiovascular;  Laterality: N/A;  . KNEE ARTHROSCOPY    . LEFT HEART CATH AND CORONARY ANGIOGRAPHY N/A 07/21/2017   Procedure:  LEFT HEART CATH AND CORONARY ANGIOGRAPHY;  Surgeon: Chad Blanks, MD;  Location: [place] [facility];  Service: Cardiovascular;  Laterality: N/A;  . LEFT HEART CATH AND CORONARY ANGIOGRAPHY N/A 07/23/2017   Procedure: LEFT HEART CATH AND CORONARY ANGIOGRAPHY;  Surgeon: Chad Blanks, MD;  Location: [place] [facility];  Service: Cardiovascular;  Laterality: N/A;    Current Outpatient Medications  Medication Sig Dispense Refill  . aspirin 81 MG chewable tablet Chew 1 tablet (81 mg total) by mouth daily.    Marland Kitchen atorvastatin (LIPITOR) 80 MG tablet TAKE 1 TABLET (80 MG TOTAL) BY MOUTH DAILY AT 6 PM 90 tablet 2  . blood glucose meter kit and supplies KIT Dispense based on patient and insurance preference. Use up to four times daily as directed. (FOR ICD-9 250.00, 250.01). 1 each 0  . carvedilol (COREG) 12.5 MG tablet Take 1 tablet (12.5 mg total) by mouth 2 (two) times daily with a meal. 60 tablet 3  . clopidogrel (PLAVIX) 75 MG tablet Take 1 tablet (75 mg total) by mouth daily. 90 tablet 3  . fenofibrate 160 MG tablet TAKE 1 TABLET BY MOUTH EVERY DAY 90 tablet 0  . nitroGLYCERIN (NITROSTAT) 0.4 MG SL tablet PLACE 1 TABLET (0.4 MG TOTAL) UNDER THE TONGUE EVERY 5 (FIVE) MINUTES AS NEEDED 25 tablet 4   No current facility-administered medications for this visit.     Allergies:   Patient has no known allergies.    Social History:  The patient  reports that he has never smoked. He has never used smokeless tobacco. He reports previous alcohol use. He reports that he does not use drugs.   Family History:  The patient's family history includes Heart failure in his paternal grandmother; Hypertension in his father, maternal grandfather, maternal grandmother, and mother; Prostate cancer in his father.    ROS:  Please see the history of present illness Otherwise, review of systems are positive for none. All other systems are reviewed and negative.    PHYSICAL EXAM: VS:  There were no  vitals taken for this visit. , BMI There is no height or weight on file to calculate BMI.   General: Well developed, well nourished, NAD Skin: Warm, dry, intact  Head: Normocephalic, atraumatic, sclera non-icteric, no xanthomas, clear, moist mucus membranes. Neck: Negative for carotid bruits. No JVD Lungs:Clear to ausculation bilaterally. No wheezes, rales, or rhonchi. Breathing is unlabored. Cardiovascular: RRR with S1 S2. No murmurs, rubs, gallops, or LV heave appreciated. Abdomen: Soft, non-tender, non-distended with normoactive bowel sounds. No hepatomegaly, No rebound/guarding. No obvious abdominal masses. MSK: Strength and tone appear normal for age. 5/5 in all extremities Extremities: No edema. No clubbing or cyanosis. DP/PT pulses 2+ bilaterally Neuro: Alert and oriented. No focal deficits. No facial asymmetry. MAE spontaneously. Psych: Responds to questions appropriately with normal affect.       EKG:  EKG {ACTION; IS/IS MLJ:[id number]} ordered today. The ekg ordered today demonstrates ***   Recent Labs: 03/31/2018: ALT 25 10/21/2018: BUN  12; Creatinine, Ser 1.49; Hemoglobin 11.8; Platelets 294; Potassium 3.6; Sodium 135    Lipid Panel    Component Value Date/Time   CHOL 155 03/31/2018 0904   CHOL 133 09/23/2017 1113   TRIG 219.0 (H) 03/31/2018 0904   HDL 28.70 (L) 03/31/2018 0904   HDL 36 (L) 09/23/2017 1113   CHOLHDL 5 03/31/2018 0904   VLDL 43.8 (H) 03/31/2018 0904   LDLCALC 83 09/23/2017 1113   LDLDIRECT 93.0 03/31/2018 0904      Wt Readings from Last 3 Encounters:  10/28/18 238 lb 3.2 oz (108 kg)  10/21/18 235 lb (106.6 kg)  05/21/18 237 lb 12.8 oz (107.9 kg)    Other studies Reviewed: Additional studies/ records that were reviewed today include:   Cardiac cath 07/21/17:  Mid RCA lesion is 100% stenosed.  Acute Mrg lesion is 50% stenosed.  A drug-eluting stent was successfully placed using a STENT SYNERGY DES 3X24.  Post intervention, there is a 0%  residual stenosis.  Dist RCA lesion is 80% stenosed.  A drug-eluting stent was successfully placed using a STENT SYNERGY DES 2.75X24.  Post intervention, there is a 0% residual stenosis.  Prox Cx to Mid Cx lesion is 30% stenosed.  Ramus lesion is 90% stenosed.  Ost Ramus lesion is 30% stenosed.  Ost 1st Diag lesion is 50% stenosed.  Prox LAD to Mid LAD lesion is 20% stenosed.  The left ventricular systolic function is normal.  LV end diastolic pressure is normal.  The left ventricular ejection fraction is greater than 65% by visual estimate.  There is no mitral valve regurgitation.  Echo 07/22/17: - Left ventricle: The cavity size was normal. There was moderate concentric hypertrophy. Systolic function was normal. The estimated ejection fraction was in the range of 60% to 65%. Wall motion was normal; there were no regional wall motion abnormalities. Doppler parameters are consistent with abnormal left ventricular relaxation (grade 1 diastolic dysfunction). - Aortic valve: Transvalvular velocity was within the normal range. There was no stenosis. There was mild regurgitation. Valve area (Vmax): 1.94 cm^2. - Mitral valve: Transvalvular velocity was within the normal range. There was no evidence for stenosis. There was no regurgitation. Valve area by pressure half-time: 1.71 cm^2. - Right ventricle: The cavity size was normal. Wall thickness was normal. Systolic function was normal. - Atrial septum: No defect or patent foramen ovale was identified. - Tricuspid valve: There was no regurgitation. - Pericardium, extracardiac: There was a left pleural effusion.  ASSESSMENT AND PLAN:  1.  ***   Current medicines are reviewed at length with the patient today.  The patient {ACTIONS; HAS/DOES NOT HAVE:19233} concerns regarding medicines.  The following changes have been made:  {PLAN; NO CHANGE:13088:s}  Labs/ tests ordered today include: *** No orders  of the defined types were placed in this encounter.    Disposition:   FU with *** in {gen number 3-52:[id number]} {Days to years:10300}  Signed, Kathyrn Drown, NP  10/29/2018 8:47 AM    [facility] [address], [place], [place]  [address] Phone: [phone]; Fax: [phone]

## 2018-10-29 NOTE — Assessment & Plan Note (Signed)
Tolerating statin, encouraged heart healthy diet, avoid trans fats, minimize simple carbs and saturated fats. Increase exercise as tolerated 

## 2018-10-29 NOTE — Assessment & Plan Note (Signed)
F/u cardiology Pt had episode cp and was seen in ER -- w/u neg  Found to be anemic

## 2018-10-29 NOTE — Assessment & Plan Note (Signed)
Recheck labs  Check ifob

## 2018-10-30 ENCOUNTER — Ambulatory Visit: Payer: BLUE CROSS/BLUE SHIELD | Admitting: Physician Assistant

## 2018-10-31 ENCOUNTER — Encounter: Payer: Self-pay | Admitting: Cardiovascular Disease

## 2018-12-14 ENCOUNTER — Other Ambulatory Visit: Payer: Self-pay | Admitting: Cardiovascular Disease

## 2019-01-12 ENCOUNTER — Other Ambulatory Visit: Payer: Self-pay | Admitting: Family Medicine

## 2019-02-09 DIAGNOSIS — Z00129 Encounter for routine child health examination without abnormal findings: Secondary | ICD-10-CM | POA: Diagnosis not present

## 2019-02-09 DIAGNOSIS — Z23 Encounter for immunization: Secondary | ICD-10-CM | POA: Diagnosis not present

## 2019-05-05 DIAGNOSIS — R040 Epistaxis: Secondary | ICD-10-CM | POA: Diagnosis not present

## 2019-05-05 DIAGNOSIS — J3089 Other allergic rhinitis: Secondary | ICD-10-CM | POA: Diagnosis not present

## 2019-05-05 DIAGNOSIS — J4520 Mild intermittent asthma, uncomplicated: Secondary | ICD-10-CM | POA: Diagnosis not present

## 2019-05-05 DIAGNOSIS — H1013 Acute atopic conjunctivitis, bilateral: Secondary | ICD-10-CM | POA: Diagnosis not present

## 2019-05-05 DIAGNOSIS — T781XXD Other adverse food reactions, not elsewhere classified, subsequent encounter: Secondary | ICD-10-CM | POA: Diagnosis not present

## 2019-05-06 DIAGNOSIS — J301 Allergic rhinitis due to pollen: Secondary | ICD-10-CM | POA: Diagnosis not present

## 2019-05-07 DIAGNOSIS — J3089 Other allergic rhinitis: Secondary | ICD-10-CM | POA: Diagnosis not present

## 2019-05-13 ENCOUNTER — Other Ambulatory Visit: Payer: Self-pay | Admitting: Cardiovascular Disease

## 2019-05-18 DIAGNOSIS — J309 Allergic rhinitis, unspecified: Secondary | ICD-10-CM | POA: Diagnosis not present

## 2019-05-25 DIAGNOSIS — I2510 Atherosclerotic heart disease of native coronary artery without angina pectoris: Secondary | ICD-10-CM | POA: Insufficient documentation

## 2019-05-25 NOTE — Progress Notes (Signed)
Cardiology Office Note    Date:  05/26/2019   ID:  Chad Chase, DOB September 03, 1970, MRN [id number]  PCP:  Carollee Herter, Alferd Apa, DO  Cardiologist: Lauree Chandler, MD EPS: None  Chief Complaint  Patient presents with  . Follow-up    History of Present Illness:  Chad Chase is a 48 y.o. male with history of CAD inf STEMI  Treated with DES x 2 RCA, staged DES intermediate branch at which time his mid RCA stents were occluded likely due to stent thrombosis from high thrombus burden and poor distal runoff due to embolization. Echo 06/2017 LVEF 60-65% mild MR. Also has HTN and T2DM, HLD. Last saw Dr. Angelena Form 05/21/18  Patient in ED with atypical chest pain 10/21/18 and troponins negative.  Patient comes in for yearly f/u. Denies chest pain, shortness of breath, dizziness or presyncope. Works in Occupational psychologist. Has gained 50 lbs since his MI. Wants to start exercising and lose some weight.BP up today. Hasn't been checking it lately.   Past Medical History:  Diagnosis Date  . CAD (coronary artery disease)   . Hypertension    10 years ago--treated with medication    Past Surgical History:  Procedure Laterality Date  . CORONARY STENT INTERVENTION N/A 07/23/2017   Procedure: CORONARY STENT INTERVENTION;  Surgeon: Burnell Blanks, MD;  Location: [facility];  Service: Cardiovascular;  Laterality: N/A;  . CORONARY/GRAFT ACUTE MI REVASCULARIZATION N/A 07/21/2017   Procedure: Coronary/Graft Acute MI Revascularization;  Surgeon: Burnell Blanks, MD;  Location: [facility];  Service: Cardiovascular;  Laterality: N/A;  . KNEE ARTHROSCOPY    . LEFT HEART CATH AND CORONARY ANGIOGRAPHY N/A 07/21/2017   Procedure: LEFT HEART CATH AND CORONARY ANGIOGRAPHY;  Surgeon: Burnell Blanks, MD;  Location: [facility];  Service: Cardiovascular;  Laterality: N/A;  . LEFT HEART CATH AND CORONARY ANGIOGRAPHY N/A 07/23/2017   Procedure: LEFT HEART CATH  AND CORONARY ANGIOGRAPHY;  Surgeon: Burnell Blanks, MD;  Location: [facility];  Service: Cardiovascular;  Laterality: N/A;    Current Medications: Current Meds  Medication Sig  . aspirin 81 MG chewable tablet Chew 1 tablet (81 mg total) by mouth daily.  Marland Kitchen atorvastatin (LIPITOR) 80 MG tablet TAKE 1 TABLET (80 MG TOTAL) BY MOUTH DAILY AT 6 PM  . blood glucose meter kit and supplies KIT Dispense based on patient and insurance preference. Use up to four times daily as directed. (FOR ICD-9 250.00, 250.01).  . carvedilol (COREG) 12.5 MG tablet TAKE 1 TABLET (12.5 MG TOTAL) BY MOUTH 2 (TWO) TIMES DAILY WITH A MEAL.  Marland Kitchen clopidogrel (PLAVIX) 75 MG tablet Take 1 tablet (75 mg total) by mouth daily. Please keep upcoming appt for future refills. Thank you  . fenofibrate 160 MG tablet TAKE 1 TABLET BY MOUTH EVERY DAY  . nitroGLYCERIN (NITROSTAT) 0.4 MG SL tablet PLACE 1 TABLET (0.4 MG TOTAL) UNDER THE TONGUE EVERY 5 (FIVE) MINUTES AS NEEDED     Allergies:   Patient has no known allergies.   Social History   Socioeconomic History  . Marital status: Married    Spouse name: Not on file  . Number of children: Not on file  . Years of education: Not on file  . Highest education level: Not on file  Occupational History  . Occupation: self employed    Fish farm manager: [employer]  Social Needs  . Financial resource strain: Not on file  . Food insecurity  Worry: Not on file    Inability: Not on file  . Transportation needs    Medical: Not on file    Non-medical: Not on file  Tobacco Use  . Smoking status: Never Smoker  . Smokeless tobacco: Never Used  Substance and Sexual Activity  . Alcohol use: Not Currently  . Drug use: No  . Sexual activity: Yes    Partners: Female  Lifestyle  . Physical activity    Days per week: Not on file    Minutes per session: Not on file  . Stress: Not on file  Relationships  . Social Herbalist on phone: Not on file    Gets together: Not  on file    Attends religious service: Not on file    Active member of club or organization: Not on file    Attends meetings of clubs or organizations: Not on file    Relationship status: Not on file  Other Topics Concern  . Not on file  Social History Narrative  . Not on file     Family History:  The patient's family history includes Heart failure in his paternal grandmother; Hypertension in his father, maternal grandfather, maternal grandmother, and mother; Prostate cancer in his father.   ROS:   Please see the history of present illness.    ROS All other systems reviewed and are negative.   PHYSICAL EXAM:   VS:  BP 136/90   Pulse 70   Ht 6' (1.829 m)   Wt 245 lb 6.4 oz (111.3 kg)   SpO2 97%   BMI 33.28 kg/m   Physical Exam  GEN: Obese, in no acute distress  Neck: no JVD, carotid bruits, or masses Cardiac:RRR; no murmurs, rubs, or gallops  Respiratory:  clear to auscultation bilaterally, normal work of breathing GI: soft, nontender, nondistended, + BS Ext: without cyanosis, clubbing, or edema, Good distal pulses bilaterally Neuro:  Alert and Oriented x 3 Psych: euthymic mood, full affect  Wt Readings from Last 3 Encounters:  05/26/19 245 lb 6.4 oz (111.3 kg)  10/28/18 238 lb 3.2 oz (108 kg)  10/21/18 235 lb (106.6 kg)      Studies/Labs Reviewed:   EKG:  EKG is not ordered today.   Recent Labs: 10/21/2018: BUN 12; Creatinine, Ser 1.49; Hemoglobin 11.8; Platelets 294; Potassium 3.6; Sodium 135   Lipid Panel    Component Value Date/Time   CHOL 155 03/31/2018 0904   CHOL 133 09/23/2017 1113   TRIG 219.0 (H) 03/31/2018 0904   HDL 28.70 (L) 03/31/2018 0904   HDL 36 (L) 09/23/2017 1113   CHOLHDL 5 03/31/2018 0904   VLDL 43.8 (H) 03/31/2018 0904   LDLCALC 83 09/23/2017 1113   LDLDIRECT 93.0 03/31/2018 0904    Additional studies/ records that were reviewed today include:   CAD inf STEMI  Treated with DES x 2 RCA, staged DES intermediate branch at which time his  mid RCA stents were occluded likely due to stnet thrombosis from high thrombus burden and poor distal runoff due to embolization. Echo 06/2017 LVEF 60-65% mild MR Cardiac cath 07/21/17:  Mid RCA lesion is 100% stenosed.  Acute Mrg lesion is 50% stenosed.  A drug-eluting stent was successfully placed using a STENT SYNERGY DES 3X24.  Post intervention, there is a 0% residual stenosis.  Dist RCA lesion is 80% stenosed.  A drug-eluting stent was successfully placed using a STENT SYNERGY DES 2.75X24.  Post intervention, there is a 0% residual stenosis.  Prox Cx to Mid Cx lesion is 30% stenosed.  Ramus lesion is 90% stenosed.  Ost Ramus lesion is 30% stenosed.  Ost 1st Diag lesion is 50% stenosed.  Prox LAD to Mid LAD lesion is 20% stenosed.  The left ventricular systolic function is normal.  LV end diastolic pressure is normal.  The left ventricular ejection fraction is greater than 65% by visual estimate.  There is no mitral valve regurgitation.   Echo 07/22/17: - Left ventricle: The cavity size was normal. There was moderate   concentric hypertrophy. Systolic function was normal. The   estimated ejection fraction was in the range of 60% to 65%. Wall   motion was normal; there were no regional wall motion   abnormalities. Doppler parameters are consistent with abnormal   left ventricular relaxation (grade 1 diastolic dysfunction). - Aortic valve: Transvalvular velocity was within the normal range.   There was no stenosis. There was mild regurgitation. Valve area   (Vmax): 1.94 cm^2. - Mitral valve: Transvalvular velocity was within the normal range.   There was no evidence for stenosis. There was no regurgitation.   Valve area by pressure half-time: 1.71 cm^2. - Right ventricle: The cavity size was normal. Wall thickness was   normal. Systolic function was normal. - Atrial septum: No defect or patent foramen ovale was identified. - Tricuspid valve: There was no  regurgitation. - Pericardium, extracardiac: There was a left pleural effusion.       ASSESSMENT:    1. Coronary artery disease involving native coronary artery of native heart without angina pectoris   2. Hyperlipidemia, unspecified hyperlipidemia type   3. Essential hypertension   4. Obesity (BMI 30.0-34.9)   5. Prediabetes      PLAN:  In order of problems listed above:  CAD inf STEMI 06/2017  Treated with DES x 2 RCA, staged DES intermediate branch at which time his mid RCA stents were occluded likely due to stnet thrombosis from high thrombus burden and poor distal runoff due to embolization. Echo 06/2017 LVEF 60-65% mild MR. No angina.  Continue plavix ASA and coreg. Has gained 50 lbs since his MI. Weight loss and exercise program recommended  HLD-continue lipitor-check FLP today  HTN-BP high today. Patient will check it at home and let us know what it is running. May need to increase coreg or add another agent. 2 gm sodium diet  Obesisty-exercise and weight loss program  Prediabetes-check A1C     Medication Adjustments/Labs and Tests Ordered: Current medicines are reviewed at length with the patient today.  Concerns regarding medicines are outlined above.  Medication changes, Labs and Tests ordered today are listed in the Patient Instructions below. Patient Instructions   Medication Instructions:  Your physician recommends that you continue on your current medications as directed. Please refer to the Current Medication list given to you today.  If you need a refill on your cardiac medications before your next appointment, please call your pharmacy.   Lab work: TODAY: LIPIDS, CMET, CBC, A1C, TSH  If you have labs (blood work) drawn today and your tests are completely normal, you will receive your results only by: Marland Kitchen MyChart Message (if you have MyChart) OR . A paper copy in the mail If you have any lab test that is abnormal or we need to change your treatment, we  will call you to review the results.  Testing/Procedures: None ordered  Follow-Up: At [facility], you and your health needs are our priority.  As part of  our continuing mission to provide you with exceptional heart care, we have created designated Provider Care Teams.  These Care Teams include your primary Cardiologist (physician) and Advanced Practice Providers (APPs -  Physician Assistants and Nurse Practitioners) who all work together to provide you with the care you need, when you need it. . You will need a follow up appointment in 1 year.  Please call our office 2 months in advance to schedule this appointment.  You may see Darlina Guys, MD or one of the following Advanced Practice Providers on your designated Care Team:   . Lyda Jester, PA-C . Dayna Dunn, PA-C . Ermalinda Barrios, PA-C  Any Other Special Instructions Will Be Listed Below (If Applicable).  1. Your pprovider has requested that you regularly monitor and record your blood pressure readings at home. Please use the same machine at the same time of day to check your readings and call us if they are elevated.  2. Your provider encouraged you to lose weight for better health.  3. Your provider recommends that you maintain 150 minutes per week of moderate aerobic activity.  Two Gram Sodium Diet 2000 mg  What is Sodium? Sodium is a mineral found naturally in many foods. The most significant source of sodium in the diet is table salt, which is about 40% sodium.  Processed, convenience, and preserved foods also contain a large amount of sodium.  The body needs only 500 mg of sodium daily to function,  A normal diet provides more than enough sodium even if you do not use salt.  Why Limit Sodium? A build up of sodium in the body can cause thirst, increased blood pressure, shortness of breath, and water retention.  Decreasing sodium in the diet can reduce edema and risk of heart attack or stroke associated with high blood  pressure.  Keep in mind that there are many other factors involved in these health problems.  Heredity, obesity, lack of exercise, cigarette smoking, stress and what you eat all play a role.  General Guidelines:  Do not add salt at the table or in cooking.  One teaspoon of salt contains over 2 grams of sodium.  Read food labels  Avoid processed and convenience foods  Ask your dietitian before eating any foods not dicussed in the menu planning guidelines  Consult your physician if you wish to use a salt substitute or a sodium containing medication such as antacids.  Limit milk and milk products to 16 oz (2 cups) per day.  Shopping Hints:  READ LABELS!! "Dietetic" does not necessarily mean low sodium.  Salt and other sodium ingredients are often added to foods during processing.   Menu Planning Guidelines Food Group Choose More Often Avoid  Beverages (see also the milk group All fruit juices, low-sodium, salt-free vegetables juices, low-sodium carbonated beverages Regular vegetable or tomato juices, commercially softened water used for drinking or cooking  Breads and Cereals Enriched white, wheat, rye and pumpernickel bread, hard rolls and dinner rolls; muffins, cornbread and waffles; most dry cereals, cooked cereal without added salt; unsalted crackers and breadsticks; low sodium or homemade bread crumbs Bread, rolls and crackers with salted tops; quick breads; instant hot cereals; pancakes; commercial bread stuffing; self-rising flower and biscuit mixes; regular bread crumbs or cracker crumbs  Desserts and Sweets Desserts and sweets mad with mild should be within allowance Instant pudding mixes and cake mixes  Fats Butter or margarine; vegetable oils; unsalted salad dressings, regular salad dressings limited to 1 Tbs; light,  sour and heavy cream Regular salad dressings containing bacon fat, bacon bits, and salt pork; snack dips made with instant soup mixes or processed cheese; salted nuts   Fruits Most fresh, frozen and canned fruits Fruits processed with salt or sodium-containing ingredient (some dried fruits are processed with sodium sulfites        Vegetables Fresh, frozen vegetables and low- sodium canned vegetables Regular canned vegetables, sauerkraut, pickled vegetables, and others prepared in brine; frozen vegetables in sauces; vegetables seasoned with ham, bacon or salt pork  Condiments, Sauces, Miscellaneous  Salt substitute with physician's approval; pepper, herbs, spices; vinegar, lemon or lime juice; hot pepper sauce; garlic powder, onion powder, low sodium soy sauce (1 Tbs.); low sodium condiments (ketchup, chili sauce, mustard) in limited amounts (1 tsp.) fresh ground horseradish; unsalted tortilla chips, pretzels, potato chips, popcorn, salsa (1/4 cup) Any seasoning made with salt including garlic salt, celery salt, onion salt, and seasoned salt; sea salt, rock salt, kosher salt; meat tenderizers; monosodium glutamate; mustard, regular soy sauce, barbecue, sauce, chili sauce, teriyaki sauce, steak sauce, Worcestershire sauce, and most flavored vinegars; canned gravy and mixes; regular condiments; salted snack foods, olives, picles, relish, horseradish sauce, catsup   Food preparation: Try these seasonings Meats:    Pork Sage, onion Serve with applesauce  Chicken Poultry seasoning, thyme, parsley Serve with cranberry sauce  Lamb Curry powder, rosemary, garlic, thyme Serve with mint sauce or jelly  Veal Marjoram, basil Serve with current jelly, cranberry sauce  Beef Pepper, bay leaf Serve with dry mustard, unsalted chive butter  Fish [place] leaf, dill Serve with unsalted lemon butter, unsalted parsley butter  Vegetables:    Asparagus Lemon juice   Broccoli Lemon juice   Carrots Mustard dressing parsley, mint, nutmeg, glazed with unsalted butter and sugar   Green beans Marjoram, lemon juice, nutmeg,dill seed   Tomatoes Basil, marjoram, onion   Spice /blend for [employer]" 4 tsp ground thyme 1 tsp ground sage 3 tsp ground rosemary 4 tsp ground marjoram   Test your knowledge 1. A product that says "Salt Free" may still contain sodium. True or False 2. Garlic Powder and Hot Pepper Sauce an be used as alternative seasonings.True or False 3. Processed foods have more sodium than fresh foods.  True or False 4. Canned Vegetables have less sodium than froze True or False  WAYS TO DECREASE YOUR SODIUM INTAKE 1. Avoid the use of added salt in cooking and at the table.  Table salt (and other prepared seasonings which contain salt) is probably one of the greatest sources of sodium in the diet.  Unsalted foods can gain flavor from the sweet, sour, and butter taste sensations of herbs and spices.  Instead of using salt for seasoning, try the following seasonings with the foods listed.  Remember: how you use them to enhance natural food flavors is limited only by your creativity... Allspice-Meat, fish, eggs, fruit, peas, red and yellow vegetables Almond Extract-Fruit baked goods Anise Seed-Sweet breads, fruit, carrots, beets, cottage cheese, cookies (tastes like licorice) Basil-Meat, fish, eggs, vegetables, rice, vegetables salads, soups, sauces Bay Leaf-Meat, fish, stews, poultry Burnet-Salad, vegetables (cucumber-like flavor) Caraway Seed-Bread, cookies, cottage cheese, meat, vegetables, cheese, rice Cardamon-Baked goods, fruit, soups Celery Powder or seed-Salads, salad dressings, sauces, meatloaf, soup, bread.Do not use  celery salt Chervil-Meats, salads, fish, eggs, vegetables, cottage cheese (parsley-like flavor) Chili Power-Meatloaf, chicken cheese, corn, eggplant, egg dishes Chives-Salads cottage cheese, egg dishes, soups, vegetables, sauces Cilantro-Salsa, casseroles Cinnamon-Baked goods, fruit, pork, lamb, chicken, carrots  Cloves-Fruit, baked goods, fish, pot roast, green beans, beets, carrots Coriander-Pastry, cookies, meat, salads, cheese (lemon-orange  flavor) Cumin-Meatloaf, fish,cheese, eggs, cabbage,fruit pie (caraway flavor) [employer], fruit, eggs, fish, poultry, cottage cheese, vegetables Dill Seed-Meat, cottage cheese, poultry, vegetables, fish, salads, bread Fennel Seed-Bread, cookies, apples, pork, eggs, fish, beets, cabbage, cheese, Licorice-like flavor Garlic-(buds or powder) Salads, meat, poultry, fish, bread, butter, vegetables, potatoes.Do not  use garlic salt Ginger-Fruit, vegetables, baked goods, meat, fish, poultry Horseradish Root-Meet, vegetables, butter Lemon Juice or Extract-Vegetables, fruit, tea, baked goods, fish salads Mace-Baked goods fruit, vegetables, fish, poultry (taste like nutmeg) Maple Extract-Syrups Marjoram-Meat, chicken, fish, vegetables, breads, green salads (taste like Sage) Mint-Tea, lamb, sherbet, vegetables, desserts, carrots, cabbage Mustard, Dry or Seed-Cheese, eggs, meats, vegetables, poultry Nutmeg-Baked goods, fruit, chicken, eggs, vegetables, desserts Onion Powder-Meat, fish, poultry, vegetables, cheese, eggs, bread, rice salads (Do not use   Onion salt) Orange Extract-Desserts, baked goods Oregano-Pasta, eggs, cheese, onions, pork, lamb, fish, chicken, vegetables, green salads Paprika-Meat, fish, poultry, eggs, cheese, vegetables Parsley Flakes-Butter, vegetables, meat fish, poultry, eggs, bread, salads (certain forms may   Contain sodium Pepper-Meat fish, poultry, vegetables, eggs Peppermint Extract-Desserts, baked goods Poppy Seed-Eggs, bread, cheese, fruit dressings, baked goods, noodles, vegetables, cottage  [employer], poultry, meat, fish, cauliflower, turnips,eggs bread Saffron-Rice, bread, veal, chicken, fish, eggs Sage-Meat, fish, poultry, onions, eggplant, tomateos, pork, stews Savory-Eggs, salads, poultry, meat, rice, vegetables, soups, pork Tarragon-Meat, poultry, fish, eggs, butter, vegetables (licorice-like flavor)   Thyme-Meat, poultry, fish, eggs, vegetables, (clover-like flavor), sauces, soups Tumeric-Salads, butter, eggs, fish, rice, vegetables (saffron-like flavor) Vanilla Extract-Baked goods, candy Vinegar-Salads, vegetables, meat marinades Walnut Extract-baked goods, candy  2. Choose your Foods Wisely   The following is a list of foods to avoid which are high in sodium:  Meats-Avoid all smoked, canned, salt cured, dried and kosher meat and fish as well as Anchovies   [employer] meats:Bologna, Liverwurst, Pastrami Canned meat or fish  Marinated herring Caviar    Pepperoni Corned Beef   Pizza Dried chipped beef  Salami Frozen breaded fish or meat Salt pork Frankfurters or hot dogs  Sardines Gefilte fish   Sausage Ham (boiled ham, Proscuitto Smoked butt    spiced ham)   Spam      TV Dinners Vegetables Canned vegetables (Regular) Relish Canned mushrooms  Sauerkraut Olives    Tomato juice Pickles  Bakery and Dessert Products Canned puddings  Cream pies Cheesecake   Decorated cakes Cookies  Beverages/Juices Tomato juice, regular  Gatorade   V-8 vegetable juice, regular  Breads and Cereals Biscuit mixes   Salted potato chips, corn chips, pretzels Bread stuffing mixes  Salted crackers and rolls Pancake and waffle mixes Self-rising flour  Seasonings Accent    Meat sauces Barbecue sauce  Meat tenderizer Catsup    Monosodium glutamate (MSG) Celery salt   Onion salt Chili sauce   Prepared mustard Garlic salt   Salt, seasoned salt, sea salt Gravy mixes   Soy sauce Horseradish   Steak sauce Ketchup   Tartar sauce Lite salt    Teriyaki sauce Marinade mixes   Worcestershire sauce  Others Baking powder   Cocoa and cocoa mixes Baking soda   Commercial casserole mixes Candy-caramels, chocolate  Dehydrated soups    Bars, fudge,nougats  Instant rice and pasta mixes Canned broth or soup  Maraschino cherries Cheese, aged and processed cheese and cheese spreads  Learning  Assessment Quiz  Indicated T (for True) or F (for False) for each of  the following statements:  1. _____ Fresh fruits and vegetables and unprocessed grains are generally low in sodium 2. _____ Water may contain a considerable amount of sodium, depending on the source 3. _____ You can always tell if a food is high in sodium by tasting it 4. _____ Certain laxatives my be high in sodium and should be avoided unless prescribed   by a physician or pharmacist 5. _____ Salt substitutes may be used freely by anyone on a sodium restricted diet 6. _____ Sodium is present in table salt, food additives and as a natural component of   most foods 7. _____ Table salt is approximately 90% sodium 8. _____ Limiting sodium intake may help prevent excess fluid accumulation in the body 9. _____ On a sodium-restricted diet, seasonings such as bouillon soy sauce, and    cooking wine should be used in place of table salt 10. _____ On an ingredient list, a product which lists monosodium glutamate as the first   ingredient is an appropriate food to include on a low sodium diet  Circle the best answer(s) to the following statements (Hint: there may be more than one correct answer)  11. On a low-sodium diet, some acceptable snack items are:    A. Olives  F. Bean dip   K. Grapefruit juice    B. Salted Pretzels G. Commercial Popcorn   L. Canned peaches    C. Carrot Sticks  H. Bouillon   M. Unsalted nuts   D. [place] fries  I. Peanut butter crackers N. Salami   E. Sweet pickles J. Tomato Juice   O. Pizza  12.  Seasonings that may be used freely on a reduced - sodium diet include   A. Lemon wedges F.Monosodium glutamate K. Celery seed    B.Soysauce   G. Pepper   L. Mustard powder   C. Sea salt  H. Cooking wine  M. Onion flakes   D. Vinegar  E. Prepared horseradish N. Salsa   E. Sage   J. Worcestershire sauce  O. [address]         Sumner Boast, PA-C  05/26/2019 9:03 AM    [facility] [address], [place], [place]  [address] Phone: [phone]; Fax: [phone]

## 2019-05-26 ENCOUNTER — Other Ambulatory Visit: Payer: Self-pay

## 2019-05-26 ENCOUNTER — Ambulatory Visit (INDEPENDENT_AMBULATORY_CARE_PROVIDER_SITE_OTHER): Payer: BC Managed Care – PPO | Admitting: Physician Assistant

## 2019-05-26 ENCOUNTER — Encounter: Payer: Self-pay | Admitting: Physician Assistant

## 2019-05-26 VITALS — BP 136/90 | HR 70 | Ht 72.0 in | Wt 245.4 lb

## 2019-05-26 DIAGNOSIS — E785 Hyperlipidemia, unspecified: Secondary | ICD-10-CM

## 2019-05-26 DIAGNOSIS — E669 Obesity, unspecified: Secondary | ICD-10-CM | POA: Diagnosis not present

## 2019-05-26 DIAGNOSIS — I2510 Atherosclerotic heart disease of native coronary artery without angina pectoris: Secondary | ICD-10-CM | POA: Diagnosis not present

## 2019-05-26 DIAGNOSIS — R7303 Prediabetes: Secondary | ICD-10-CM

## 2019-05-26 DIAGNOSIS — I10 Essential (primary) hypertension: Secondary | ICD-10-CM

## 2019-05-26 LAB — COMPREHENSIVE METABOLIC PANEL
ALT: 23 IU/L (ref 0–44)
AST: 34 IU/L (ref 0–40)
Albumin/Globulin Ratio: 1.4 (ref 1.2–2.2)
Albumin: 4.2 g/dL (ref 4.0–5.0)
Alkaline Phosphatase: 54 IU/L (ref 39–117)
BUN/Creatinine Ratio: 9 (ref 9–20)
BUN: 15 mg/dL (ref 6–24)
Bilirubin Total: 0.4 mg/dL (ref 0.0–1.2)
CO2: 24 mmol/L (ref 20–29)
Calcium: 9.6 mg/dL (ref 8.7–10.2)
Chloride: 101 mmol/L (ref 96–106)
Creatinine, Ser: 1.75 mg/dL — ABNORMAL HIGH (ref 0.76–1.27)
GFR calc Af Amer: 52 mL/min/{1.73_m2} — ABNORMAL LOW (ref >59)
GFR calc non Af Amer: 45 mL/min/{1.73_m2} — ABNORMAL LOW (ref >59)
Globulin, Total: 3 g/dL (ref 1.5–4.5)
Glucose: 104 mg/dL — ABNORMAL HIGH (ref 65–99)
Potassium: 4.7 mmol/L (ref 3.5–5.2)
Sodium: 137 mmol/L (ref 134–144)
Total Protein: 7.2 g/dL (ref 6.0–8.5)

## 2019-05-26 LAB — CBC
Hematocrit: 36.4 % — ABNORMAL LOW (ref 37.5–51.0)
Hemoglobin: 11.6 g/dL — ABNORMAL LOW (ref 13.0–17.7)
MCH: 27.5 pg (ref 26.6–33.0)
MCHC: 31.9 g/dL (ref 31.5–35.7)
MCV: 86 fL (ref 79–97)
Platelets: 332 10*3/uL (ref 150–450)
RBC: 4.22 x10E6/uL (ref 4.14–5.80)
RDW: 13.3 % (ref 11.6–15.4)
WBC: 4.7 10*3/uL (ref 3.4–10.8)

## 2019-05-26 LAB — LIPID PANEL
Chol/HDL Ratio: 5.5 ratio — ABNORMAL HIGH (ref 0.0–5.0)
Cholesterol, Total: 170 mg/dL (ref 100–199)
HDL: 31 mg/dL — ABNORMAL LOW (ref >39)
LDL Chol Calc (NIH): 122 mg/dL — ABNORMAL HIGH (ref 0–99)
Triglycerides: 89 mg/dL (ref 0–149)
VLDL Cholesterol Cal: 17 mg/dL (ref 5–40)

## 2019-05-26 LAB — TSH: TSH: 1.14 u[IU]/mL (ref 0.450–4.500)

## 2019-05-26 LAB — HEMOGLOBIN A1C
Est. average glucose Bld gHb Est-mCnc: 146 mg/dL
Hgb A1c MFr Bld: 6.7 % — ABNORMAL HIGH (ref 4.8–5.6)

## 2019-05-26 NOTE — Patient Instructions (Signed)
Medication Instructions:  Your physician recommends that you continue on your current medications as directed. Please refer to the Current Medication list given to you today.  If you need a refill on your cardiac medications before your next appointment, please call your pharmacy.   Lab work: TODAY: LIPIDS, CMET, CBC, A1C, TSH  If you have labs (blood work) drawn today and your tests are completely normal, you will receive your results only by: Marland Kitchen. MyChart Message (if you have MyChart) OR . A paper copy in the mail If you have any lab test that is abnormal or we need to change your treatment, we will call you to review the results.  Testing/Procedures: None ordered  Follow-Up: [facility], you and your health needs are our priority.  As part of our continuing mission to provide you with exceptional heart care, we have created designated Provider Care Teams.  These Care Teams include your primary Cardiologist (physician) and Advanced Practice Providers (APPs -  Physician Assistants and Nurse Practitioners) who all work together to provide you with the care you need, when you need it. . You will need a follow up appointment in 1 year.  Please call our office 2 months in advance to schedule this appointment.  You may see Earney Hamburghris McAlhany, MD or one of the following Advanced Practice Providers on your designated Care Team:   . Robbie LisBrittainy Simmons, PA-C . Dayna Dunn, PA-C . Jacolyn ReedyMichele Lenze, PA-C  Any Other Special Instructions Will Be Listed Below (If Applicable).  1. Your pprovider has requested that you regularly monitor and record your blood pressure readings at home. Please use the same machine at the same time of day to check your readings and call us if they are elevated.  2. Your provider encouraged you to lose weight for better health.  3. Your provider recommends that you maintain 150 minutes per week of moderate aerobic activity.  Two Gram Sodium Diet 2000 mg  What is  Sodium? Sodium is a mineral found naturally in many foods. The most significant source of sodium in the diet is table salt, which is about 40% sodium.  Processed, convenience, and preserved foods also contain a large amount of sodium.  The body needs only 500 mg of sodium daily to function,  A normal diet provides more than enough sodium even if you do not use salt.  Why Limit Sodium? A build up of sodium in the body can cause thirst, increased blood pressure, shortness of breath, and water retention.  Decreasing sodium in the diet can reduce edema and risk of heart attack or stroke associated with high blood pressure.  Keep in mind that there are many other factors involved in these health problems.  Heredity, obesity, lack of exercise, cigarette smoking, stress and what you eat all play a role.  General Guidelines:  Do not add salt at the table or in cooking.  One teaspoon of salt contains over 2 grams of sodium.  Read food labels  Avoid processed and convenience foods  Ask your dietitian before eating any foods not dicussed in the menu planning guidelines  Consult your physician if you wish to use a salt substitute or a sodium containing medication such as antacids.  Limit milk and milk products to 16 oz (2 cups) per day.  Shopping Hints:  READ LABELS!! "Dietetic" does not necessarily mean low sodium.  Salt and other sodium ingredients are often added to foods during processing.   Menu Planning Guidelines Food Group Choose  More Often Avoid  Beverages (see also the milk group All fruit juices, low-sodium, salt-free vegetables juices, low-sodium carbonated beverages Regular vegetable or tomato juices, commercially softened water used for drinking or cooking  Breads and Cereals Enriched white, wheat, rye and pumpernickel bread, hard rolls and dinner rolls; muffins, cornbread and waffles; most dry cereals, cooked cereal without added salt; unsalted crackers and breadsticks; low sodium or  homemade bread crumbs Bread, rolls and crackers with salted tops; quick breads; instant hot cereals; pancakes; commercial bread stuffing; self-rising flower and biscuit mixes; regular bread crumbs or cracker crumbs  Desserts and Sweets Desserts and sweets mad with mild should be within allowance Instant pudding mixes and cake mixes  Fats Butter or margarine; vegetable oils; unsalted salad dressings, regular salad dressings limited to 1 Tbs; light, sour and heavy cream Regular salad dressings containing bacon fat, bacon bits, and salt pork; snack dips made with instant soup mixes or processed cheese; salted nuts  Fruits Most fresh, frozen and canned fruits Fruits processed with salt or sodium-containing ingredient (some dried fruits are processed with sodium sulfites        Vegetables Fresh, frozen vegetables and low- sodium canned vegetables Regular canned vegetables, sauerkraut, pickled vegetables, and others prepared in brine; frozen vegetables in sauces; vegetables seasoned with ham, bacon or salt pork  Condiments, Sauces, Miscellaneous  Salt substitute with physician's approval; pepper, herbs, spices; vinegar, lemon or lime juice; hot pepper sauce; garlic powder, onion powder, low sodium soy sauce (1 Tbs.); low sodium condiments (ketchup, chili sauce, mustard) in limited amounts (1 tsp.) fresh ground horseradish; unsalted tortilla chips, pretzels, potato chips, popcorn, salsa (1/4 cup) Any seasoning made with salt including garlic salt, celery salt, onion salt, and seasoned salt; sea salt, rock salt, kosher salt; meat tenderizers; monosodium glutamate; mustard, regular soy sauce, barbecue, sauce, chili sauce, teriyaki sauce, steak sauce, Worcestershire sauce, and most flavored vinegars; canned gravy and mixes; regular condiments; salted snack foods, olives, picles, relish, horseradish sauce, catsup   Food preparation: Try these seasonings Meats:    Pork Sage, onion Serve with applesauce  Chicken  Poultry seasoning, thyme, parsley Serve with cranberry sauce  Lamb Curry powder, rosemary, garlic, thyme Serve with mint sauce or jelly  Veal Marjoram, basil Serve with current jelly, cranberry sauce  Beef Pepper, bay leaf Serve with dry mustard, unsalted chive butter  Fish [place] leaf, dill Serve with unsalted lemon butter, unsalted parsley butter  Vegetables:    Asparagus Lemon juice   Broccoli Lemon juice   Carrots Mustard dressing parsley, mint, nutmeg, glazed with unsalted butter and sugar   Green beans Marjoram, lemon juice, nutmeg,dill seed   Tomatoes Basil, marjoram, onion   Spice /blend for [employer]" 4 tsp ground thyme 1 tsp ground sage 3 tsp ground rosemary 4 tsp ground marjoram   Test your knowledge 1. A product that says "Salt Free" may still contain sodium. True or False 2. Garlic Powder and Hot Pepper Sauce an be used as alternative seasonings.True or False 3. Processed foods have more sodium than fresh foods.  True or False 4. Canned Vegetables have less sodium than froze True or False  WAYS TO DECREASE YOUR SODIUM INTAKE 1. Avoid the use of added salt in cooking and at the table.  Table salt (and other prepared seasonings which contain salt) is probably one of the greatest sources of sodium in the diet.  Unsalted foods can gain flavor from the sweet, sour, and butter taste sensations of herbs and spices.  Instead of using salt for seasoning, try the following seasonings with the foods listed.  Remember: how you use them to enhance natural food flavors is limited only by your creativity... Allspice-Meat, fish, eggs, fruit, peas, red and yellow vegetables Almond Extract-Fruit baked goods Anise Seed-Sweet breads, fruit, carrots, beets, cottage cheese, cookies (tastes like licorice) Basil-Meat, fish, eggs, vegetables, rice, vegetables salads, soups, sauces Bay Leaf-Meat, fish, stews, poultry Burnet-Salad, vegetables (cucumber-like flavor) Caraway Seed-Bread, cookies, cottage  cheese, meat, vegetables, cheese, rice Cardamon-Baked goods, fruit, soups Celery Powder or seed-Salads, salad dressings, sauces, meatloaf, soup, bread.Do not use  celery salt Chervil-Meats, salads, fish, eggs, vegetables, cottage cheese (parsley-like flavor) Chili Power-Meatloaf, chicken cheese, corn, eggplant, egg dishes Chives-Salads cottage cheese, egg dishes, soups, vegetables, sauces Cilantro-Salsa, casseroles Cinnamon-Baked goods, fruit, pork, lamb, chicken, carrots Cloves-Fruit, baked goods, fish, pot roast, green beans, beets, carrots Coriander-Pastry, cookies, meat, salads, cheese (lemon-orange flavor) Cumin-Meatloaf, fish,cheese, eggs, cabbage,fruit pie (caraway flavor) [employer], fruit, eggs, fish, poultry, cottage cheese, vegetables Dill Seed-Meat, cottage cheese, poultry, vegetables, fish, salads, bread Fennel Seed-Bread, cookies, apples, pork, eggs, fish, beets, cabbage, cheese, Licorice-like flavor Garlic-(buds or powder) Salads, meat, poultry, fish, bread, butter, vegetables, potatoes.Do not  use garlic salt Ginger-Fruit, vegetables, baked goods, meat, fish, poultry Horseradish Root-Meet, vegetables, butter Lemon Juice or Extract-Vegetables, fruit, tea, baked goods, fish salads Mace-Baked goods fruit, vegetables, fish, poultry (taste like nutmeg) Maple Extract-Syrups Marjoram-Meat, chicken, fish, vegetables, breads, green salads (taste like Sage) Mint-Tea, lamb, sherbet, vegetables, desserts, carrots, cabbage Mustard, Dry or Seed-Cheese, eggs, meats, vegetables, poultry Nutmeg-Baked goods, fruit, chicken, eggs, vegetables, desserts Onion Powder-Meat, fish, poultry, vegetables, cheese, eggs, bread, rice salads (Do not use   Onion salt) Orange Extract-Desserts, baked goods Oregano-Pasta, eggs, cheese, onions, pork, lamb, fish, chicken, vegetables, green salads Paprika-Meat, fish, poultry, eggs, cheese, vegetables Parsley Flakes-Butter, vegetables, meat fish,  poultry, eggs, bread, salads (certain forms may   Contain sodium Pepper-Meat fish, poultry, vegetables, eggs Peppermint Extract-Desserts, baked goods Poppy Seed-Eggs, bread, cheese, fruit dressings, baked goods, noodles, vegetables, cottage  [employer], poultry, meat, fish, cauliflower, turnips,eggs bread Saffron-Rice, bread, veal, chicken, fish, eggs Sage-Meat, fish, poultry, onions, eggplant, tomateos, pork, stews Savory-Eggs, salads, poultry, meat, rice, vegetables, soups, pork Tarragon-Meat, poultry, fish, eggs, butter, vegetables (licorice-like flavor)  Thyme-Meat, poultry, fish, eggs, vegetables, (clover-like flavor), sauces, soups Tumeric-Salads, butter, eggs, fish, rice, vegetables (saffron-like flavor) Vanilla Extract-Baked goods, candy Vinegar-Salads, vegetables, meat marinades Walnut Extract-baked goods, candy  2. Choose your Foods Wisely   The following is a list of foods to avoid which are high in sodium:  Meats-Avoid all smoked, canned, salt cured, dried and kosher meat and fish as well as Anchovies   [employer] meats:Bologna, Liverwurst, Pastrami Canned meat or fish  Marinated herring Caviar    Pepperoni Corned Beef   Pizza Dried chipped beef  Salami Frozen breaded fish or meat Salt pork Frankfurters or hot dogs  Sardines Gefilte fish   Sausage Ham (boiled ham, Proscuitto Smoked butt    spiced ham)   Spam      TV Dinners Vegetables Canned vegetables (Regular) Relish Canned mushrooms  Sauerkraut Olives    Tomato juice Pickles  Bakery and Dessert Products Canned puddings  Cream pies Cheesecake   Decorated cakes Cookies  Beverages/Juices Tomato juice, regular  Gatorade   V-8 vegetable juice, regular  Breads and Cereals Biscuit mixes   Salted potato chips, corn chips, pretzels Bread stuffing mixes  Salted crackers and rolls Pancake and waffle mixes Self-rising  flour  Seasonings Accent    Meat  sauces Barbecue sauce  Meat tenderizer Catsup    Monosodium glutamate (MSG) Celery salt   Onion salt Chili sauce   Prepared mustard Garlic salt   Salt, seasoned salt, sea salt Gravy mixes   Soy sauce Horseradish   Steak sauce Ketchup   Tartar sauce Lite salt    Teriyaki sauce Marinade mixes   Worcestershire sauce  Others Baking powder   Cocoa and cocoa mixes Baking soda   Commercial casserole mixes Candy-caramels, chocolate  Dehydrated soups    Bars, fudge,nougats  Instant rice and pasta mixes Canned broth or soup  Maraschino cherries Cheese, aged and processed cheese and cheese spreads  Learning Assessment Quiz  Indicated T (for True) or F (for False) for each of the following statements:  1. _____ Fresh fruits and vegetables and unprocessed grains are generally low in sodium 2. _____ Water may contain a considerable amount of sodium, depending on the source 3. _____ You can always tell if a food is high in sodium by tasting it 4. _____ Certain laxatives my be high in sodium and should be avoided unless prescribed   by a physician or pharmacist 5. _____ Salt substitutes may be used freely by anyone on a sodium restricted diet 6. _____ Sodium is present in table salt, food additives and as a natural component of   most foods 7. _____ Table salt is approximately 90% sodium 8. _____ Limiting sodium intake may help prevent excess fluid accumulation in the body 9. _____ On a sodium-restricted diet, seasonings such as bouillon soy sauce, and    cooking wine should be used in place of table salt 10. _____ On an ingredient list, a product which lists monosodium glutamate as the first   ingredient is an appropriate food to include on a low sodium diet  Circle the best answer(s) to the following statements (Hint: there may be more than one correct answer)  11. On a low-sodium diet, some acceptable snack items are:    A. Olives  F. Bean dip   K. Grapefruit juice    B. Salted  Pretzels G. Commercial Popcorn   L. Canned peaches    C. Carrot Sticks  H. Bouillon   M. Unsalted nuts   D. [place] fries  I. Peanut butter crackers N. Salami   E. Sweet pickles J. Tomato Juice   O. Pizza  12.  Seasonings that may be used freely on a reduced - sodium diet include   A. Lemon wedges F.Monosodium glutamate K. Celery seed    B.Soysauce   G. Pepper   L. Mustard powder   C. Sea salt  H. Cooking wine  M. Onion flakes   D. Vinegar  E. Prepared horseradish N. Salsa   E. Sage   J. Worcestershire sauce  O. Chutney

## 2019-06-06 ENCOUNTER — Other Ambulatory Visit: Payer: Self-pay | Admitting: Cardiovascular Disease

## 2019-06-26 ENCOUNTER — Other Ambulatory Visit: Payer: Self-pay | Admitting: Cardiovascular Disease

## 2019-07-05 ENCOUNTER — Other Ambulatory Visit: Payer: Self-pay | Admitting: Family Medicine

## 2019-07-07 ENCOUNTER — Telehealth: Payer: Self-pay | Admitting: *Deleted

## 2019-07-07 DIAGNOSIS — I2510 Atherosclerotic heart disease of native coronary artery without angina pectoris: Secondary | ICD-10-CM

## 2019-07-07 DIAGNOSIS — E785 Hyperlipidemia, unspecified: Secondary | ICD-10-CM

## 2019-07-07 MED ORDER — EZETIMIBE 10 MG PO TABS: 10.0000 mg | ORAL_TABLET | Freq: Every day | ORAL | 3 refills | Status: DC

## 2019-07-07 NOTE — Telephone Encounter (Signed)
Notes recorded by Imogene Burn, PA-C on 05/27/2019 at 7:55 AM EDT  Mildly anemic similar to the past. LDL 122. Goal is less than 70. Add Zetia 10 mg daily and verify he's taking his lipitor daily. Recheck FLP and LFT's in 3 months. A1C 6.7 which is better than last year. Needs exercise and weight loss program. Kidney function worse than last year. Not on any meds that would cause this. Repeat bmet in 3 months with lipids. If continues to trend up he may need referred to renal. F/U with PCP.   _____________________________________________________________________________________________  Chad Chase with patient. He verbalizes understanding of all information above.  He takes atorvastatin 80 mg every day at 6pm and will add zetia.  Discussed worsening kidney function and plans for repeat labs in 3 months.   Will forward all labs to PCP.

## 2019-07-08 ENCOUNTER — Other Ambulatory Visit: Payer: Self-pay | Admitting: Cardiology

## 2019-10-03 IMAGING — CR DG CHEST 2V
2 series · 2 of 2 positions shown · non-contrast
Comparison: PA and lateral chest 07/21/2018.

CLINICAL DATA: Chest pain yesterday while playing basketball.

EXAM:
CHEST - 2 VIEW

[w chest pa]
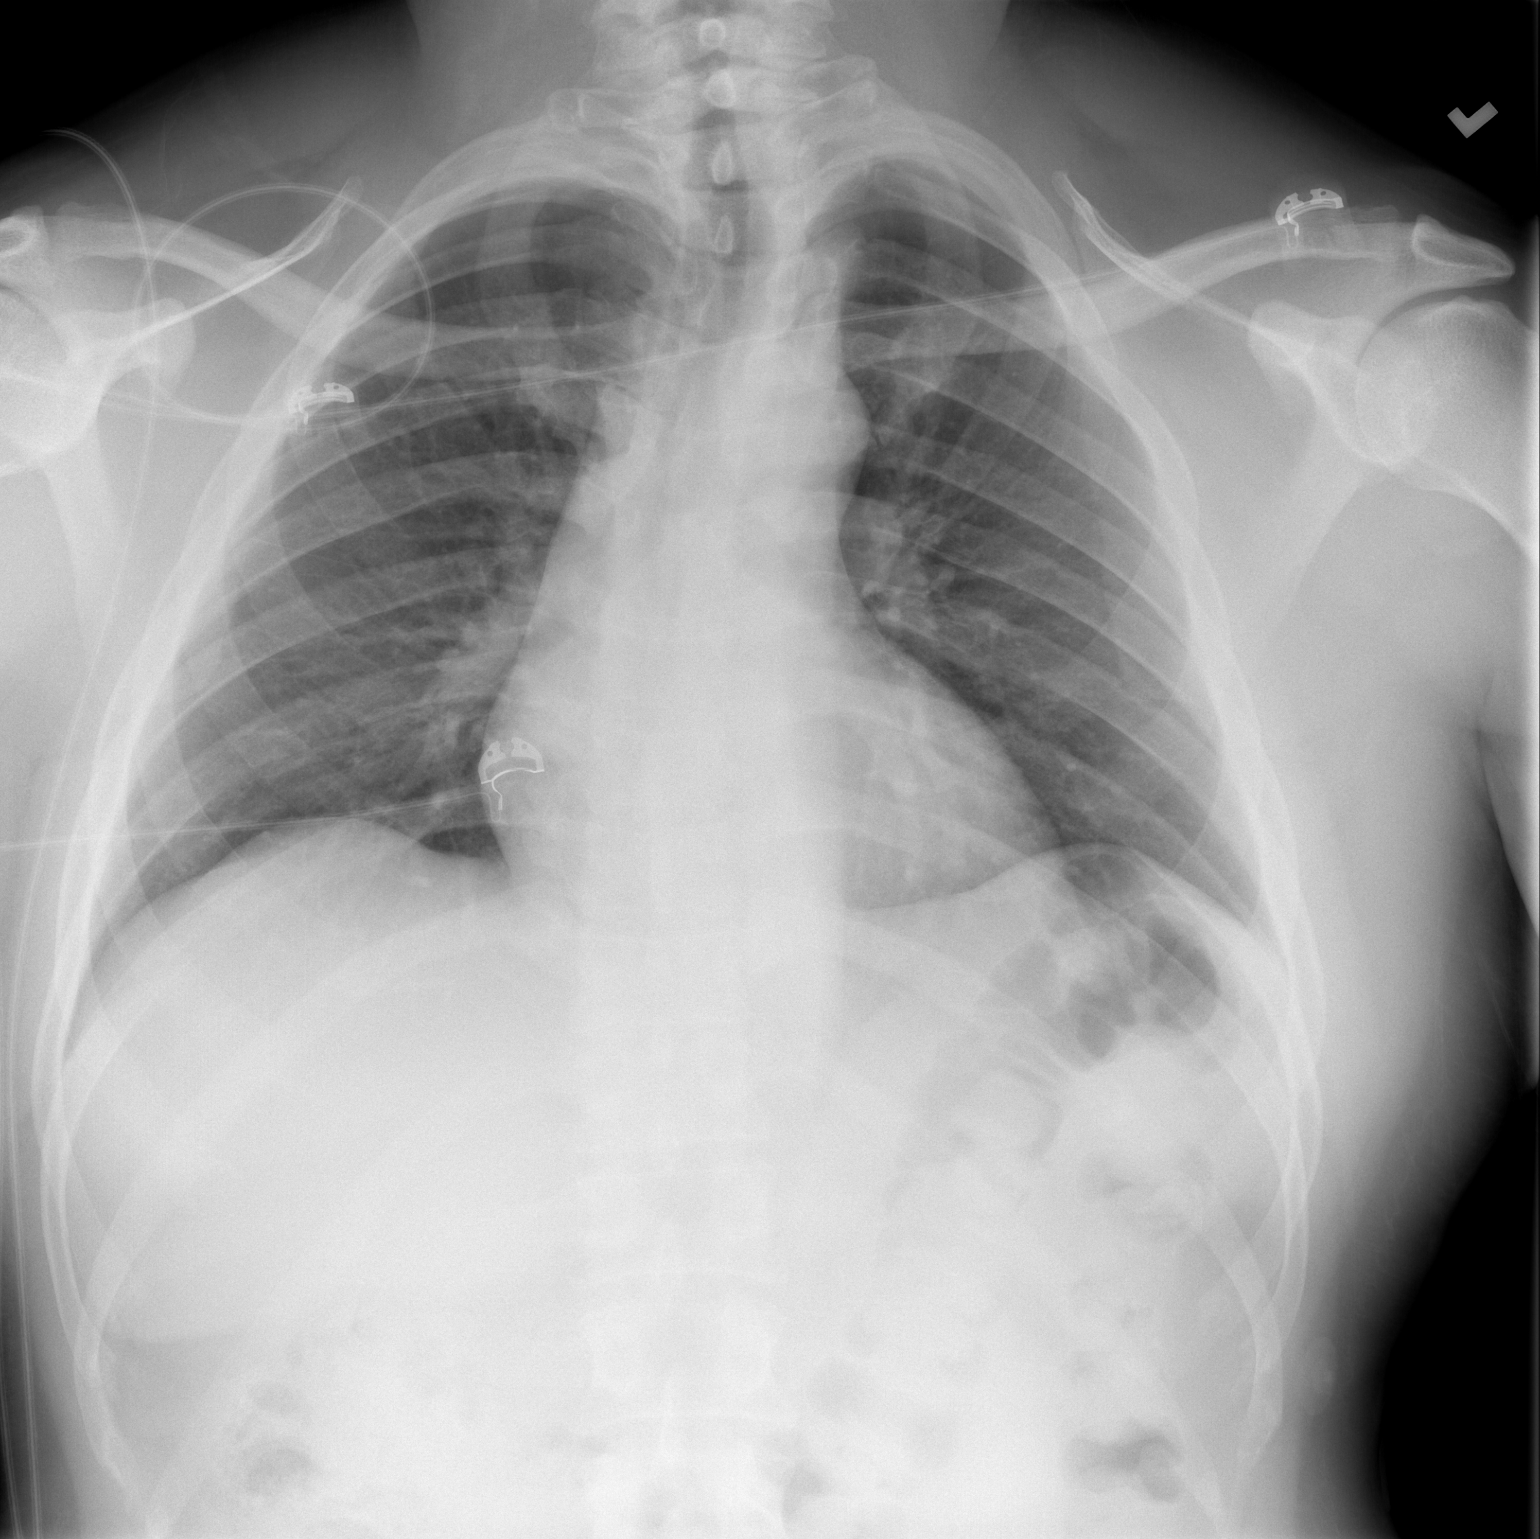

[w chest lat]
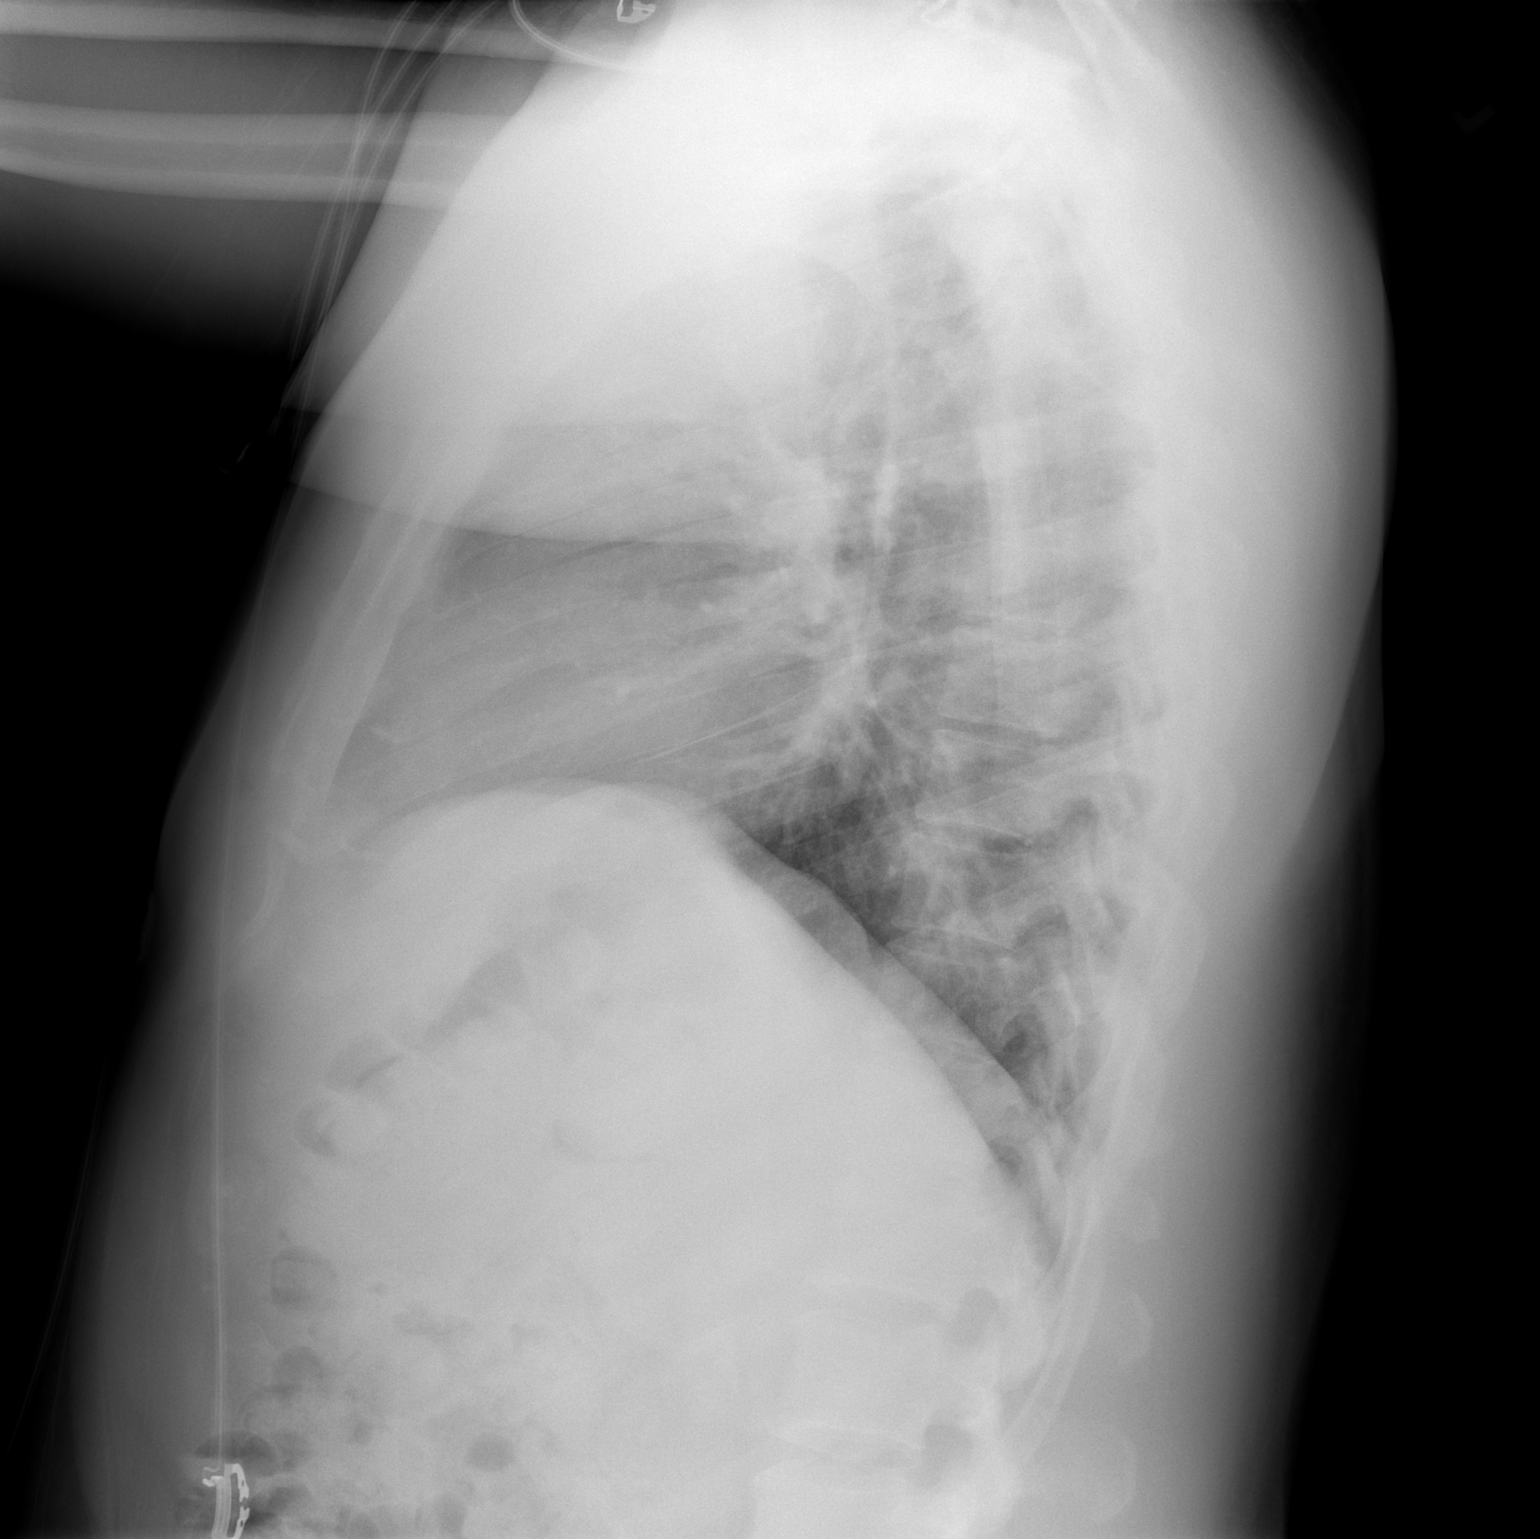

[2 of 2 positions shown; findings below may reference images not displayed]

FINDINGS: Lungs clear. Heart size normal. No pneumothorax or pleural fluid. No
acute bony abnormality. Scoliosis noted.
IMPRESSION: No acute disease.

## 2019-10-09 ENCOUNTER — Other Ambulatory Visit: Payer: Self-pay

## 2019-10-09 ENCOUNTER — Other Ambulatory Visit: Payer: BC Managed Care – PPO | Admitting: *Deleted

## 2019-10-09 DIAGNOSIS — E785 Hyperlipidemia, unspecified: Secondary | ICD-10-CM

## 2019-10-09 DIAGNOSIS — I2510 Atherosclerotic heart disease of native coronary artery without angina pectoris: Secondary | ICD-10-CM

## 2019-10-09 LAB — BASIC METABOLIC PANEL
BUN/Creatinine Ratio: 9 (ref 9–20)
BUN: 13 mg/dL (ref 6–24)
CO2: 25 mmol/L (ref 20–29)
Calcium: 9.4 mg/dL (ref 8.7–10.2)
Chloride: 103 mmol/L (ref 96–106)
Creatinine, Ser: 1.51 mg/dL — ABNORMAL HIGH (ref 0.76–1.27)
GFR calc Af Amer: 62 mL/min/{1.73_m2} (ref >59)
GFR calc non Af Amer: 53 mL/min/{1.73_m2} — ABNORMAL LOW (ref >59)
Glucose: 117 mg/dL — ABNORMAL HIGH (ref 65–99)
Potassium: 4.1 mmol/L (ref 3.5–5.2)
Sodium: 140 mmol/L (ref 134–144)

## 2019-10-09 LAB — HEPATIC FUNCTION PANEL
ALT: 26 IU/L (ref 0–44)
AST: 26 IU/L (ref 0–40)
Albumin: 4.2 g/dL (ref 4.0–5.0)
Alkaline Phosphatase: 65 IU/L (ref 39–117)
Bilirubin Total: 0.3 mg/dL (ref 0.0–1.2)
Bilirubin, Direct: 0.12 mg/dL (ref 0.00–0.40)
Total Protein: 7 g/dL (ref 6.0–8.5)

## 2019-10-09 LAB — LIPID PANEL
Chol/HDL Ratio: 4.7 ratio (ref 0.0–5.0)
Cholesterol, Total: 146 mg/dL (ref 100–199)
HDL: 31 mg/dL — ABNORMAL LOW (ref >39)
LDL Chol Calc (NIH): 101 mg/dL — ABNORMAL HIGH (ref 0–99)
Triglycerides: 71 mg/dL (ref 0–149)
VLDL Cholesterol Cal: 14 mg/dL (ref 5–40)

## 2019-10-13 DIAGNOSIS — M25562 Pain in left knee: Secondary | ICD-10-CM | POA: Diagnosis not present

## 2019-10-13 DIAGNOSIS — G8929 Other chronic pain: Secondary | ICD-10-CM | POA: Diagnosis not present

## 2019-10-14 NOTE — Progress Notes (Addendum)
Patient ID: Chad Chase                 DOB: 1971/07/21                    MRN: [id number]     HPI: Chad Chase is a 49 y.o. male patient of Dr Angelena Form referred to lipid clinic by Ermalinda Barrios, PA. PMH is significant for CAD s/p STEMI tx with DES in 2018, HTN, DM2, HLD, and obesity. Pt seen in the office in Sept 2020, had reported gaining 50 lbs after MI, wanted to start exercising and losing some weight.  Visit conducted remotely today due to inclement weather. Pt reports tolerating his atorvastatin and ezetimibe well. He tries to eat a low saturated fat diet, not exercising too much currently.  Current Medications: atorvastatin 18m daily, ezetimibe 155mdaily Intolerances: none Risk Factors: CAD s/p MI and DES, HTN, DM, obesity LDL goal: <708mL  Diet: Skips breakfast, sandwich or leftovers for lunch. Rice, baked chicken, pasta  Exercise: Has gone to gym a little bit, not much overall  Family History: The patient's family history includes Heart failure in his paternal grandmother; Hypertension in his father, maternal grandfather, maternal grandmother, and mother; Prostate cancer in his father.   Social History: Denies tobacco, alcohol, and illicit drug use  Labs: 10/09/19: TC 146, TG 71, HDL 31, LDL 101 (atorvastatin 37m68mily, ezetimibe 10mg68mly) 05/26/19: TC 170, TG 89, HDL 31, LDL 122 (atorvastatin 37mg 57my)  Past Medical History:  Diagnosis Date  . CAD (coronary artery disease)   . Hypertension    10 years ago--treated with medication    Current Outpatient Medications on File Prior to Visit  Medication Sig Dispense Refill  . aspirin 81 MG chewable tablet Chew 1 tablet (81 mg total) by mouth daily.    . atorMarland Kitchenastatin (LIPITOR) 80 MG tablet TAKE 1 TABLET (80 MG TOTAL) BY MOUTH DAILY AT 6 PM 90 tablet 3  . blood glucose meter kit and supplies KIT Dispense based on patient and insurance preference. Use up to four times daily as directed. (FOR ICD-9 250.00, 250.01). 1  each 0  . carvedilol (COREG) 12.5 MG tablet TAKE 1 TABLET (12.5 MG TOTAL) BY MOUTH 2 (TWO) TIMES DAILY WITH A MEAL. 180 tablet 1  . clopidogrel (PLAVIX) 75 MG tablet Take 1 tablet (75 mg total) by mouth daily. 90 tablet 3  . ezetimibe (ZETIA) 10 MG tablet Take 1 tablet (10 mg total) by mouth daily. 90 tablet 3  . fenofibrate 160 MG tablet TAKE 1 TABLET BY MOUTH EVERY DAY 90 tablet 1  . nitroGLYCERIN (NITROSTAT) 0.4 MG SL tablet PLACE 1 TABLET (0.4 MG TOTAL) UNDER THE TONGUE EVERY 5 (FIVE) MINUTES AS NEEDED 25 tablet 4   No current facility-administered medications on file prior to visit.    No Known Allergies  Assessment/Plan:  1. Hyperlipidemia - LDL 101 on atorvastatin 37mg d64m and ezetimibe 10mg da24m above goal < 70 due to hx of ASCVD. Discussed changing ezetimibe to Nexlizet for additional 20% LDL lowering or PCSK9i for 60% additional LDL lowering. Pt prefers to try Nexlizet at this time rather than injectable therapy. Will continue atorvastatin 37mg dai49mnd change ezetimibe to Nexlizet. Prior authorization submitted and approved. Copay card has been activated and sent to pharmacy, f/u labs scheduled.  [place] Merida E. Embree Brawley, PharmD, BCACP, CPP [phone]  11:16 AM

## 2019-10-15 ENCOUNTER — Telehealth (INDEPENDENT_AMBULATORY_CARE_PROVIDER_SITE_OTHER): Payer: BC Managed Care – PPO | Admitting: Pharmacist

## 2019-10-15 ENCOUNTER — Telehealth: Payer: Self-pay | Admitting: Pharmacist

## 2019-10-15 ENCOUNTER — Other Ambulatory Visit: Payer: Self-pay

## 2019-10-15 ENCOUNTER — Ambulatory Visit: Payer: BC Managed Care – PPO

## 2019-10-15 DIAGNOSIS — E785 Hyperlipidemia, unspecified: Secondary | ICD-10-CM

## 2019-10-15 MED ORDER — NEXLIZET 180-10 MG PO TABS: 1.0000 | ORAL_TABLET | Freq: Every day | ORAL | 3 refills | Status: DC

## 2019-10-15 NOTE — Telephone Encounter (Signed)
Left message for pt to advise him that Nexlizet PA was approved, rx and copay card sent to pharmacy, and to schedule f/u labs in 2-3 months.

## 2019-10-15 NOTE — Addendum Note (Signed)
Addended by: Kadin Bera E on: 10/15/2019 11:29 AM   Modules accepted: Orders

## 2019-10-20 NOTE — Telephone Encounter (Signed)
Scheduled f/u labs in 3 months.

## 2019-10-20 NOTE — Addendum Note (Signed)
Addended by: Reianna Batdorf E on: 10/20/2019 09:47 AM   Modules accepted: Orders

## 2019-10-22 DIAGNOSIS — M7651 Patellar tendinitis, right knee: Secondary | ICD-10-CM | POA: Diagnosis not present

## 2019-10-29 DIAGNOSIS — M7651 Patellar tendinitis, right knee: Secondary | ICD-10-CM | POA: Diagnosis not present

## 2019-12-07 DIAGNOSIS — F329 Major depressive disorder, single episode, unspecified: Secondary | ICD-10-CM | POA: Diagnosis not present

## 2019-12-10 DIAGNOSIS — M7651 Patellar tendinitis, right knee: Secondary | ICD-10-CM | POA: Diagnosis not present

## 2019-12-20 ENCOUNTER — Other Ambulatory Visit: Payer: Self-pay | Admitting: Cardiovascular Disease

## 2019-12-28 ENCOUNTER — Other Ambulatory Visit: Payer: Self-pay | Admitting: Family Medicine

## 2019-12-28 ENCOUNTER — Other Ambulatory Visit: Payer: BC Managed Care – PPO

## 2019-12-30 ENCOUNTER — Other Ambulatory Visit: Payer: BC Managed Care – PPO | Admitting: *Deleted

## 2019-12-30 ENCOUNTER — Other Ambulatory Visit: Payer: Self-pay

## 2019-12-30 DIAGNOSIS — E785 Hyperlipidemia, unspecified: Secondary | ICD-10-CM

## 2019-12-30 LAB — COMPREHENSIVE METABOLIC PANEL
ALT: 37 IU/L (ref 0–44)
AST: 48 IU/L — ABNORMAL HIGH (ref 0–40)
Albumin/Globulin Ratio: 1.4 (ref 1.2–2.2)
Albumin: 4.1 g/dL (ref 4.0–5.0)
Alkaline Phosphatase: 87 IU/L (ref 39–117)
BUN/Creatinine Ratio: 10 (ref 9–20)
BUN: 16 mg/dL (ref 6–24)
Bilirubin Total: 0.5 mg/dL (ref 0.0–1.2)
CO2: 24 mmol/L (ref 20–29)
Calcium: 9.6 mg/dL (ref 8.7–10.2)
Chloride: 103 mmol/L (ref 96–106)
Creatinine, Ser: 1.66 mg/dL — ABNORMAL HIGH (ref 0.76–1.27)
GFR calc Af Amer: 55 mL/min/{1.73_m2} — ABNORMAL LOW (ref >59)
GFR calc non Af Amer: 48 mL/min/{1.73_m2} — ABNORMAL LOW (ref >59)
Globulin, Total: 2.9 g/dL (ref 1.5–4.5)
Glucose: 113 mg/dL — ABNORMAL HIGH (ref 65–99)
Potassium: 4.6 mmol/L (ref 3.5–5.2)
Sodium: 140 mmol/L (ref 134–144)
Total Protein: 7 g/dL (ref 6.0–8.5)

## 2019-12-30 LAB — LIPID PANEL
Chol/HDL Ratio: 4.7 ratio (ref 0.0–5.0)
Cholesterol, Total: 85 mg/dL — ABNORMAL LOW (ref 100–199)
HDL: 18 mg/dL — ABNORMAL LOW (ref >39)
LDL Chol Calc (NIH): 45 mg/dL (ref 0–99)
Triglycerides: 120 mg/dL (ref 0–149)
VLDL Cholesterol Cal: 22 mg/dL (ref 5–40)

## 2019-12-31 ENCOUNTER — Telehealth: Payer: Self-pay | Admitting: Pharmacist

## 2019-12-31 NOTE — Telephone Encounter (Signed)
Patient advised that lipid labs look good. LDL is at goal. Continue nexlizet and fenofibrate. Very mild increase in AST. All other labs stable. Recheck in 6 months Patient appreciative of the call

## 2020-01-14 DIAGNOSIS — M7918 Myalgia, other site: Secondary | ICD-10-CM | POA: Diagnosis not present

## 2020-01-20 ENCOUNTER — Other Ambulatory Visit: Payer: Self-pay | Admitting: Family Medicine

## 2020-02-16 DIAGNOSIS — Z00129 Encounter for routine child health examination without abnormal findings: Secondary | ICD-10-CM | POA: Diagnosis not present

## 2020-02-16 DIAGNOSIS — Z23 Encounter for immunization: Secondary | ICD-10-CM | POA: Diagnosis not present

## 2020-02-20 ENCOUNTER — Other Ambulatory Visit: Payer: Self-pay | Admitting: Family Medicine

## 2020-02-25 ENCOUNTER — Telehealth: Payer: Self-pay | Admitting: Family Medicine

## 2020-02-25 MED ORDER — FENOFIBRATE 160 MG PO TABS: 160.0000 mg | ORAL_TABLET | Freq: Every day | ORAL | 0 refills | Status: DC

## 2020-02-25 NOTE — Telephone Encounter (Signed)
Refill sent.

## 2020-02-25 NOTE — Telephone Encounter (Signed)
°  Patient scheduled 07/13 for follow up     Medication: fenofibrate 160 MG tablet  Has the patient contacted their pharmacy? No. (If no, request that the patient contact the pharmacy for the refill.) (If yes, when and what did the pharmacy advise?)  Preferred Pharmacy (with phone number or street name):  [id number] - [place], [place] - [address]  [address], [place] [address]  Phone:  [phone] Fax:  [phone]  DEA #:  [id number]       Agent: Please be advised that RX refills may take up to 3 business days. We ask that you follow-up with your pharmacy.

## 2020-03-02 DIAGNOSIS — Z00129 Encounter for routine child health examination without abnormal findings: Secondary | ICD-10-CM | POA: Diagnosis not present

## 2020-03-08 ENCOUNTER — Encounter: Payer: Self-pay | Admitting: Family Medicine

## 2020-03-08 ENCOUNTER — Other Ambulatory Visit: Payer: Self-pay

## 2020-03-08 ENCOUNTER — Ambulatory Visit (INDEPENDENT_AMBULATORY_CARE_PROVIDER_SITE_OTHER): Payer: BC Managed Care – PPO | Admitting: Family Medicine

## 2020-03-08 ENCOUNTER — Other Ambulatory Visit: Payer: Self-pay | Admitting: Family Medicine

## 2020-03-08 VITALS — BP 118/80 | HR 57 | Temp 97.9°F | Resp 18 | Ht 72.0 in | Wt 237.6 lb

## 2020-03-08 DIAGNOSIS — E785 Hyperlipidemia, unspecified: Secondary | ICD-10-CM

## 2020-03-08 DIAGNOSIS — R739 Hyperglycemia, unspecified: Secondary | ICD-10-CM | POA: Diagnosis not present

## 2020-03-08 DIAGNOSIS — N189 Chronic kidney disease, unspecified: Secondary | ICD-10-CM

## 2020-03-08 DIAGNOSIS — Z862 Personal history of diseases of the blood and blood-forming organs and certain disorders involving the immune mechanism: Secondary | ICD-10-CM | POA: Diagnosis not present

## 2020-03-08 DIAGNOSIS — E119 Type 2 diabetes mellitus without complications: Secondary | ICD-10-CM

## 2020-03-08 DIAGNOSIS — E782 Mixed hyperlipidemia: Secondary | ICD-10-CM

## 2020-03-08 DIAGNOSIS — I10 Essential (primary) hypertension: Secondary | ICD-10-CM | POA: Diagnosis not present

## 2020-03-08 DIAGNOSIS — E1165 Type 2 diabetes mellitus with hyperglycemia: Secondary | ICD-10-CM

## 2020-03-08 DIAGNOSIS — E1169 Type 2 diabetes mellitus with other specified complication: Secondary | ICD-10-CM

## 2020-03-08 DIAGNOSIS — D631 Anemia in chronic kidney disease: Secondary | ICD-10-CM

## 2020-03-08 LAB — CBC WITH DIFFERENTIAL/PLATELET
Basophils Absolute: 0 10*3/uL (ref 0.0–0.1)
Basophils Relative: 0.2 % (ref 0.0–3.0)
Eosinophils Absolute: 0.1 10*3/uL (ref 0.0–0.7)
Eosinophils Relative: 1.7 % (ref 0.0–5.0)
HCT: 37 % — ABNORMAL LOW (ref 39.0–52.0)
Hemoglobin: 12.1 g/dL — ABNORMAL LOW (ref 13.0–17.0)
Lymphocytes Relative: 38.4 % (ref 12.0–46.0)
Lymphs Abs: 1.7 10*3/uL (ref 0.7–4.0)
MCHC: 32.7 g/dL (ref 30.0–36.0)
MCV: 87.8 fl (ref 78.0–100.0)
Monocytes Absolute: 0.5 10*3/uL (ref 0.1–1.0)
Monocytes Relative: 12.1 % — ABNORMAL HIGH (ref 3.0–12.0)
Neutro Abs: 2.1 10*3/uL (ref 1.4–7.7)
Neutrophils Relative %: 47.6 % (ref 43.0–77.0)
Platelets: 272 10*3/uL (ref 150.0–400.0)
RBC: 4.22 Mil/uL (ref 4.22–5.81)
RDW: 15 % (ref 11.5–15.5)
WBC: 4.5 10*3/uL (ref 4.0–10.5)

## 2020-03-08 LAB — COMPREHENSIVE METABOLIC PANEL
ALT: 35 U/L (ref 0–53)
AST: 39 U/L — ABNORMAL HIGH (ref 0–37)
Albumin: 4 g/dL (ref 3.5–5.2)
Alkaline Phosphatase: 71 U/L (ref 39–117)
BUN: 19 mg/dL (ref 6–23)
CO2: 29 mEq/L (ref 19–32)
Calcium: 9.5 mg/dL (ref 8.4–10.5)
Chloride: 104 mEq/L (ref 96–112)
Creatinine, Ser: 1.52 mg/dL — ABNORMAL HIGH (ref 0.40–1.50)
GFR: 59.05 mL/min — ABNORMAL LOW (ref >60.00)
Glucose, Bld: 122 mg/dL — ABNORMAL HIGH (ref 70–99)
Potassium: 4.8 mEq/L (ref 3.5–5.1)
Sodium: 139 mEq/L (ref 135–145)
Total Bilirubin: 0.6 mg/dL (ref 0.2–1.2)
Total Protein: 6.8 g/dL (ref 6.0–8.3)

## 2020-03-08 LAB — LIPID PANEL
Cholesterol: 79 mg/dL (ref 0–200)
HDL: 14.5 mg/dL — ABNORMAL LOW (ref >39.00)
LDL Cholesterol: 42 mg/dL (ref 0–99)
NonHDL: 64.53
Total CHOL/HDL Ratio: 5
Triglycerides: 112 mg/dL (ref 0.0–149.0)
VLDL: 22.4 mg/dL (ref 0.0–40.0)

## 2020-03-08 LAB — HEMOGLOBIN A1C: Hgb A1c MFr Bld: 7.1 % — ABNORMAL HIGH (ref 4.6–6.5)

## 2020-03-08 LAB — MICROALBUMIN / CREATININE URINE RATIO
Creatinine,U: 185.9 mg/dL
Microalb Creat Ratio: 0.4 mg/g (ref 0.0–30.0)
Microalb, Ur: <0.7 mg/dL (ref 0.0–1.9)

## 2020-03-08 LAB — IBC + FERRITIN
Ferritin: 393 ng/mL — ABNORMAL HIGH (ref 22.0–322.0)
Iron: 56 ug/dL (ref 42–165)
Saturation Ratios: 15.8 % — ABNORMAL LOW (ref 20.0–50.0)
Transferrin: 253 mg/dL (ref 212.0–360.0)

## 2020-03-08 MED ORDER — FENOFIBRATE 160 MG PO TABS: 160.0000 mg | ORAL_TABLET | Freq: Every day | ORAL | 1 refills | Status: DC

## 2020-03-08 NOTE — Assessment & Plan Note (Signed)
Tolerating statin, encouraged heart healthy diet, avoid trans fats, minimize simple carbs and saturated fats. Increase exercise as tolerated 

## 2020-03-08 NOTE — Progress Notes (Signed)
Patient ID: Chad Chase, male    DOB: 11/03/1970  Age: 49 y.o. MRN: [id number]    Subjective:  Subjective  HPI Chad Chase presents for f/u bp , chol and glucose.  No complaints  Review of Systems  Constitutional: Negative for appetite change, diaphoresis, fatigue and unexpected weight change.  Eyes: Negative for pain, redness and visual disturbance.  Respiratory: Negative for cough, chest tightness, shortness of breath and wheezing.   Cardiovascular: Negative for chest pain, palpitations and leg swelling.  Endocrine: Negative for cold intolerance, heat intolerance, polydipsia, polyphagia and polyuria.  Genitourinary: Negative for difficulty urinating, dysuria and frequency.  Neurological: Negative for dizziness, light-headedness, numbness and headaches.    History Past Medical History:  Diagnosis Date  . CAD (coronary artery disease)   . Hypertension    10 years ago--treated with medication    He has a past surgical history that includes Knee arthroscopy; Coronary/Graft Acute MI Revascularization (N/A, 07/21/2017); LEFT HEART CATH AND CORONARY ANGIOGRAPHY (N/A, 07/21/2017); CORONARY STENT INTERVENTION (N/A, 07/23/2017); and LEFT HEART CATH AND CORONARY ANGIOGRAPHY (N/A, 07/23/2017).   His family history includes Heart failure in his paternal grandmother; Hypertension in his father, maternal grandfather, maternal grandmother, and mother; Prostate cancer in his father.He reports that he has never smoked. He has never used smokeless tobacco. He reports previous alcohol use. He reports that he does not use drugs.  Current Outpatient Medications on File Prior to Visit  Medication Sig Dispense Refill  . aspirin 81 MG chewable tablet Chew 1 tablet (81 mg total) by mouth daily.    Marland Kitchen atorvastatin (LIPITOR) 80 MG tablet TAKE 1 TABLET (80 MG TOTAL) BY MOUTH DAILY AT 6 PM 90 tablet 3  . Bempedoic Acid-Ezetimibe (NEXLIZET) 180-10 MG TABS Take 1 tablet by mouth daily. 90 tablet 3  . blood  glucose meter kit and supplies KIT Dispense based on patient and insurance preference. Use up to four times daily as directed. (FOR ICD-9 250.00, 250.01). 1 each 0  . carvedilol (COREG) 12.5 MG tablet TAKE 1 TABLET (12.5 MG TOTAL) BY MOUTH 2 (TWO) TIMES DAILY WITH A MEAL. 180 tablet 1  . clopidogrel (PLAVIX) 75 MG tablet Take 1 tablet (75 mg total) by mouth daily. 90 tablet 3  . nitroGLYCERIN (NITROSTAT) 0.4 MG SL tablet PLACE 1 TABLET (0.4 MG TOTAL) UNDER THE TONGUE EVERY 5 (FIVE) MINUTES AS NEEDED 25 tablet 4   No current facility-administered medications on file prior to visit.     Objective:  Objective  Physical Exam Vitals and nursing note reviewed.  Constitutional:      General: He is not in acute distress.    Appearance: He is well-developed.  Cardiovascular:     Rate and Rhythm: Normal rate and regular rhythm.     Heart sounds: Normal heart sounds.  Pulmonary:     Effort: Pulmonary effort is normal. No respiratory distress.     Breath sounds: Normal breath sounds.  Psychiatric:        Behavior: Behavior normal.        Thought Content: Thought content normal.        Judgment: Judgment normal.    BP 118/80 (BP Location: Right Arm, Patient Position: Sitting, Cuff Size: Large)   Pulse (!) 57   Temp 97.9 F (36.6 C) (Temporal)   Resp 18   Ht 6' (1.829 m)   Wt 237 lb 9.6 oz (107.8 kg)   SpO2 98%   BMI 32.22 kg/m  Wt Readings from Last 3  Encounters:  03/08/20 237 lb 9.6 oz (107.8 kg)  05/26/19 245 lb 6.4 oz (111.3 kg)  10/28/18 238 lb 3.2 oz (108 kg)     Lab Results  Component Value Date   WBC 4.7 05/26/2019   HGB 11.6 (L) 05/26/2019   HCT 36.4 (L) 05/26/2019   PLT 332 05/26/2019   GLUCOSE 113 (H) 12/30/2019   CHOL 85 (L) 12/30/2019   TRIG 120 12/30/2019   HDL 18 (L) 12/30/2019   LDLDIRECT 93.0 03/31/2018   LDLCALC 45 12/30/2019   ALT 37 12/30/2019   AST 48 (H) 12/30/2019   NA 140 12/30/2019   K 4.6 12/30/2019   CL 103 12/30/2019   CREATININE 1.66 (H)  12/30/2019   BUN 16 12/30/2019   CO2 24 12/30/2019   TSH 1.140 05/26/2019   PSA 0.38 06/21/2016   INR 0.94 07/22/2017   HGBA1C 6.7 (H) 05/26/2019    DG Chest 2 View  Result Date: 10/21/2018 CLINICAL DATA:  Chest pain yesterday while playing basketball. EXAM: CHEST - 2 VIEW COMPARISON:  PA and lateral chest 07/21/2018. FINDINGS: Lungs clear. Heart size normal. No pneumothorax or pleural fluid. No acute bony abnormality. Scoliosis noted. IMPRESSION: No acute disease. Electronically Signed   By: Inge Rise M.D.   On: 10/21/2018 20:06     Assessment & Plan:  Plan  I am having Alvester Chou L. Ringold maintain his aspirin, blood glucose meter kit and supplies, nitroGLYCERIN, clopidogrel, atorvastatin, Nexlizet, carvedilol, and fenofibrate.  Meds ordered this encounter  Medications  . fenofibrate 160 MG tablet    Sig: Take 1 tablet (160 mg total) by mouth daily. Pt needs OV for further refills    Dispense:  90 tablet    Refill:  1    Problem List Items Addressed This Visit      Unprioritized   Diet-controlled diabetes mellitus ([place])    Check labs today      Hyperlipidemia - Primary    Tolerating statin, encouraged heart healthy diet, avoid trans fats, minimize simple carbs and saturated fats. Increase exercise as tolerated      Relevant Medications   fenofibrate 160 MG tablet   Other Relevant Orders   Microalbumin / creatinine urine ratio   Lipid panel   Comprehensive metabolic panel   IBC + Ferritin   Hypertension    Well controlled, no changes to meds. Encouraged heart healthy diet such as the DASH diet and exercise as tolerated.       Relevant Medications   fenofibrate 160 MG tablet    Other Visit Diagnoses    Hyperglycemia       Relevant Orders   Hemoglobin A1c   Microalbumin / creatinine urine ratio   Lipid panel   Comprehensive metabolic panel   IBC + Ferritin   Hx of iron deficiency anemia       Relevant Orders   CBC with Differential/Platelet   Microalbumin  / creatinine urine ratio   Lipid panel   Comprehensive metabolic panel   IBC + Ferritin      Follow-up: Return in about 6 months (around 09/08/2020) for annual exam, fasting.  Ann Held, DO

## 2020-03-08 NOTE — Patient Instructions (Signed)
DASH Eating Plan DASH stands for "Dietary Approaches to Stop Hypertension." The DASH eating plan is a healthy eating plan that has been shown to reduce high blood pressure (hypertension). It may also reduce your risk for type 2 diabetes, heart disease, and stroke. The DASH eating plan may also help with weight loss. What are tips for following this plan?  General guidelines  Avoid eating more than 2,300 mg (milligrams) of salt (sodium) a day. If you have hypertension, you may need to reduce your sodium intake to 1,500 mg a day.  Limit alcohol intake to no more than 1 drink a day for nonpregnant women and 2 drinks a day for men. One drink equals 12 oz of beer, 5 oz of wine, or 1 oz of hard liquor.  Work with your health care provider to maintain a healthy body weight or to lose weight. Ask what an ideal weight is for you.  Get at least 30 minutes of exercise that causes your heart to beat faster (aerobic exercise) most days of the week. Activities may include walking, swimming, or biking.  Work with your health care provider or diet and nutrition specialist (dietitian) to adjust your eating plan to your individual calorie needs. Reading food labels   Check food labels for the amount of sodium per serving. Choose foods with less than 5 percent of the Daily Value of sodium. Generally, foods with less than 300 mg of sodium per serving fit into this eating plan.  To find whole grains, look for the word "whole" as the first word in the ingredient list. Shopping  Buy products labeled as "low-sodium" or "no salt added."  Buy fresh foods. Avoid canned foods and premade or frozen meals. Cooking  Avoid adding salt when cooking. Use salt-free seasonings or herbs instead of table salt or sea salt. Check with your health care provider or pharmacist before using salt substitutes.  Do not fry foods. Cook foods using healthy methods such as baking, boiling, grilling, and broiling instead.  Cook with  heart-healthy oils, such as olive, canola, soybean, or sunflower oil. Meal planning  Eat a balanced diet that includes: ? 5 or more servings of fruits and vegetables each day. At each meal, try to fill half of your plate with fruits and vegetables. ? Up to 6-8 servings of whole grains each day. ? Less than 6 oz of lean meat, poultry, or fish each day. A 3-oz serving of meat is about the same size as a deck of cards. One egg equals 1 oz. ? 2 servings of low-fat dairy each day. ? A serving of nuts, seeds, or beans 5 times each week. ? Heart-healthy fats. Healthy fats called Omega-3 fatty acids are found in foods such as flaxseeds and coldwater fish, like sardines, salmon, and mackerel.  Limit how much you eat of the following: ? Canned or prepackaged foods. ? Food that is high in trans fat, such as fried foods. ? Food that is high in saturated fat, such as fatty meat. ? Sweets, desserts, sugary drinks, and other foods with added sugar. ? Full-fat dairy products.  Do not salt foods before eating.  Try to eat at least 2 vegetarian meals each week.  Eat more home-cooked food and less restaurant, buffet, and fast food.  When eating at a restaurant, ask that your food be prepared with less salt or no salt, if possible. What foods are recommended? The items listed may not be a complete list. Talk with your dietitian about   what dietary choices are best for you. Grains Whole-grain or whole-wheat bread. Whole-grain or whole-wheat pasta. Brown rice. Oatmeal. Quinoa. Bulgur. Whole-grain and low-sodium cereals. Pita bread. Low-fat, low-sodium crackers. Whole-wheat flour tortillas. Vegetables Fresh or frozen vegetables (raw, steamed, roasted, or grilled). Low-sodium or reduced-sodium tomato and vegetable juice. Low-sodium or reduced-sodium tomato sauce and tomato paste. Low-sodium or reduced-sodium canned vegetables. Fruits All fresh, dried, or frozen fruit. Canned fruit in natural juice (without  added sugar). Meat and other protein foods Skinless chicken or turkey. Ground chicken or turkey. Pork with fat trimmed off. Fish and seafood. Egg whites. Dried beans, peas, or lentils. Unsalted nuts, nut butters, and seeds. Unsalted canned beans. Lean cuts of beef with fat trimmed off. Low-sodium, lean deli meat. Dairy Low-fat (1%) or fat-free (skim) milk. Fat-free, low-fat, or reduced-fat cheeses. Nonfat, low-sodium ricotta or cottage cheese. Low-fat or nonfat yogurt. Low-fat, low-sodium cheese. Fats and oils Soft margarine without trans fats. Vegetable oil. Low-fat, reduced-fat, or light mayonnaise and salad dressings (reduced-sodium). Canola, safflower, olive, soybean, and sunflower oils. Avocado. Seasoning and other foods Herbs. Spices. Seasoning mixes without salt. Unsalted popcorn and pretzels. Fat-free sweets. What foods are not recommended? The items listed may not be a complete list. Talk with your dietitian about what dietary choices are best for you. Grains Baked goods made with fat, such as croissants, muffins, or some breads. Dry pasta or rice meal packs. Vegetables Creamed or fried vegetables. Vegetables in a cheese sauce. Regular canned vegetables (not low-sodium or reduced-sodium). Regular canned tomato sauce and paste (not low-sodium or reduced-sodium). Regular tomato and vegetable juice (not low-sodium or reduced-sodium). Pickles. Olives. Fruits Canned fruit in a light or heavy syrup. Fried fruit. Fruit in cream or butter sauce. Meat and other protein foods Fatty cuts of meat. Ribs. Fried meat. Bacon. Sausage. Bologna and other processed lunch meats. Salami. Fatback. Hotdogs. Bratwurst. Salted nuts and seeds. Canned beans with added salt. Canned or smoked fish. Whole eggs or egg yolks. Chicken or turkey with skin. Dairy Whole or 2% milk, cream, and half-and-half. Whole or full-fat cream cheese. Whole-fat or sweetened yogurt. Full-fat cheese. Nondairy creamers. Whipped toppings.  Processed cheese and cheese spreads. Fats and oils Butter. Stick margarine. Lard. Shortening. Ghee. Bacon fat. Tropical oils, such as coconut, palm kernel, or palm oil. Seasoning and other foods Salted popcorn and pretzels. Onion salt, garlic salt, seasoned salt, table salt, and sea salt. Worcestershire sauce. Tartar sauce. Barbecue sauce. Teriyaki sauce. Soy sauce, including reduced-sodium. Steak sauce. Canned and packaged gravies. Fish sauce. Oyster sauce. Cocktail sauce. Horseradish that you find on the shelf. Ketchup. Mustard. Meat flavorings and tenderizers. Bouillon cubes. Hot sauce and Tabasco sauce. Premade or packaged marinades. Premade or packaged taco seasonings. Relishes. Regular salad dressings. Where to find more information:  National Heart, Lung, and Blood Institute: www.nhlbi.nih.gov  American Heart Association: www.heart.org Summary  The DASH eating plan is a healthy eating plan that has been shown to reduce high blood pressure (hypertension). It may also reduce your risk for type 2 diabetes, heart disease, and stroke.  With the DASH eating plan, you should limit salt (sodium) intake to 2,300 mg a day. If you have hypertension, you may need to reduce your sodium intake to 1,500 mg a day.  When on the DASH eating plan, aim to eat more fresh fruits and vegetables, whole grains, lean proteins, low-fat dairy, and heart-healthy fats.  Work with your health care provider or diet and nutrition specialist (dietitian) to adjust your eating plan to your   individual calorie needs. This information is not intended to replace advice given to you by your health care provider. Make sure you discuss any questions you have with your health care provider. Document Revised: 07/26/2017 Document Reviewed: 08/06/2016 Elsevier Patient Education  2020 Elsevier Inc.  

## 2020-03-08 NOTE — Assessment & Plan Note (Signed)
Well controlled, no changes to meds. Encouraged heart healthy diet such as the DASH diet and exercise as tolerated.  °

## 2020-03-08 NOTE — Assessment & Plan Note (Signed)
Check labs today.

## 2020-03-09 MED ORDER — METFORMIN HCL 500 MG PO TABS
500.0000 mg | ORAL_TABLET | Freq: Every day | ORAL | 1 refills | Status: DC
Start: 2020-03-09 — End: 2020-08-29

## 2020-03-15 DIAGNOSIS — Z23 Encounter for immunization: Secondary | ICD-10-CM | POA: Diagnosis not present

## 2020-04-11 MED ORDER — SILDENAFIL CITRATE 50 MG PO TABS
50.0000 mg | ORAL_TABLET | Freq: Every day | ORAL | 1 refills | Status: DC | PRN
Start: 2020-04-11 — End: 2021-06-09

## 2020-04-11 NOTE — Telephone Encounter (Signed)
Sildenafil sent.  Message back to patient to let him know.  Instructions for avoiding use within 48 hours of nitroglycerin provided.

## 2020-04-27 DIAGNOSIS — M7651 Patellar tendinitis, right knee: Secondary | ICD-10-CM | POA: Diagnosis not present

## 2020-05-09 ENCOUNTER — Encounter: Payer: Self-pay | Admitting: Family Medicine

## 2020-05-10 NOTE — Telephone Encounter (Signed)
The nephrologist will help [place] with this

## 2020-05-13 ENCOUNTER — Other Ambulatory Visit: Payer: Self-pay

## 2020-05-13 ENCOUNTER — Encounter: Payer: Self-pay | Admitting: Cardiovascular Disease

## 2020-05-13 ENCOUNTER — Ambulatory Visit (INDEPENDENT_AMBULATORY_CARE_PROVIDER_SITE_OTHER): Payer: BC Managed Care – PPO | Admitting: Cardiovascular Disease

## 2020-05-13 VITALS — BP 116/80 | HR 51 | Ht 72.0 in | Wt 230.0 lb

## 2020-05-13 DIAGNOSIS — E785 Hyperlipidemia, unspecified: Secondary | ICD-10-CM

## 2020-05-13 DIAGNOSIS — I2510 Atherosclerotic heart disease of native coronary artery without angina pectoris: Secondary | ICD-10-CM

## 2020-05-13 DIAGNOSIS — I10 Essential (primary) hypertension: Secondary | ICD-10-CM

## 2020-05-13 MED ORDER — NITROGLYCERIN 0.4 MG SL SUBL: 0.4000 mg | SUBLINGUAL_TABLET | SUBLINGUAL | 4 refills | Status: DC | PRN

## 2020-05-13 NOTE — Patient Instructions (Signed)

## 2020-05-13 NOTE — Progress Notes (Signed)
Chief Complaint  Patient presents with  . Follow-up    CAD   History of Present Illness: 49 yo male with history of CAD and HTN who is here today for follow up. He was admitted to [facility] November 2018 with an inferior STEMI secondary to an occluded RCA. Two drug eluting stents were placed in the RCA. Staged PCI of the intermediate branch with placement of a drug eluting stent. During the staged PCI, he was found to have occlusion of the mid RCA stents, likely due to stent thrombosis from high thrombus burden and poor distal runoff due to embolization. Echo November 2018 with LVEF=60-65%, mild MR. He did well following his MI and completed cardiac rehab. BP and HR low in January 2019 at rehab so Coreg dose was lowered and Norvasc was stopped.   He is here today for follow up. The patient denies any chest pain, dyspnea, palpitations, lower extremity edema, orthopnea, PND, dizziness, near syncope or syncope. He is feeling well.   Primary Care Physician: Carollee Herter, Alferd Apa, DO  Past Medical History:  Diagnosis Date  . CAD (coronary artery disease)   . Hypertension    10 years ago--treated with medication    Past Surgical History:  Procedure Laterality Date  . CORONARY STENT INTERVENTION N/A 07/23/2017   Procedure: CORONARY STENT INTERVENTION;  Surgeon: Burnell Blanks, MD;  Location: [place] [facility];  Service: Cardiovascular;  Laterality: N/A;  . CORONARY/GRAFT ACUTE MI REVASCULARIZATION N/A 07/21/2017   Procedure: Coronary/Graft Acute MI Revascularization;  Surgeon: Burnell Blanks, MD;  Location: [place] [facility];  Service: Cardiovascular;  Laterality: N/A;  . KNEE ARTHROSCOPY    . LEFT HEART CATH AND CORONARY ANGIOGRAPHY N/A 07/21/2017   Procedure: LEFT HEART CATH AND CORONARY ANGIOGRAPHY;  Surgeon: Burnell Blanks, MD;  Location: [place] [facility];  Service: Cardiovascular;  Laterality: N/A;  . LEFT HEART CATH AND CORONARY ANGIOGRAPHY N/A 07/23/2017    Procedure: LEFT HEART CATH AND CORONARY ANGIOGRAPHY;  Surgeon: Burnell Blanks, MD;  Location: [place] [facility];  Service: Cardiovascular;  Laterality: N/A;    Current Outpatient Medications  Medication Sig Dispense Refill  . aspirin 81 MG chewable tablet Chew 1 tablet (81 mg total) by mouth daily.    Marland Kitchen atorvastatin (LIPITOR) 80 MG tablet TAKE 1 TABLET (80 MG TOTAL) BY MOUTH DAILY AT 6 PM 90 tablet 3  . Bempedoic Acid-Ezetimibe (NEXLIZET) 180-10 MG TABS Take 1 tablet by mouth daily. 90 tablet 3  . blood glucose meter kit and supplies KIT Dispense based on patient and insurance preference. Use up to four times daily as directed. (FOR ICD-9 250.00, 250.01). 1 each 0  . carvedilol (COREG) 12.5 MG tablet TAKE 1 TABLET (12.5 MG TOTAL) BY MOUTH 2 (TWO) TIMES DAILY WITH A MEAL. 180 tablet 1  . clopidogrel (PLAVIX) 75 MG tablet Take 1 tablet (75 mg total) by mouth daily. 90 tablet 3  . fenofibrate 160 MG tablet Take 1 tablet (160 mg total) by mouth daily. Pt needs OV for further refills 90 tablet 1  . metFORMIN (GLUCOPHAGE) 500 MG tablet Take 1 tablet (500 mg total) by mouth daily with breakfast. 90 tablet 1  . nitroGLYCERIN (NITROSTAT) 0.4 MG SL tablet Place 1 tablet (0.4 mg total) under the tongue every 5 (five) minutes as needed. 25 tablet 4  . sildenafil (VIAGRA) 50 MG tablet Take 1 tablet (50 mg total) by mouth daily as needed for erectile dysfunction. Do not use within  48 hrs of nitroglycerin 10 tablet 1   No current facility-administered medications for this visit.    No Known Allergies  Social History   Socioeconomic History  . Marital status: Married    Spouse name: Not on file  . Number of children: Not on file  . Years of education: Not on file  . Highest education level: Not on file  Occupational History  . Occupation: self employed    Fish farm manager: [employer]  Tobacco Use  . Smoking status: Never Smoker  . Smokeless tobacco: Never Used  Vaping Use  . Vaping Use: Never  used  Substance and Sexual Activity  . Alcohol use: Not Currently  . Drug use: No  . Sexual activity: Yes    Partners: Female  Other Topics Concern  . Not on file  Social History Narrative  . Not on file   Social Determinants of Health   Financial Resource Strain:   . Difficulty of Paying Living Expenses: Not on file  Food Insecurity:   . Worried About Charity fundraiser in the Last Year: Not on file  . Ran Out of Food in the Last Year: Not on file  Transportation Needs:   . Lack of Transportation (Medical): Not on file  . Lack of Transportation (Non-Medical): Not on file  Physical Activity:   . Days of Exercise per Week: Not on file  . Minutes of Exercise per Session: Not on file  Stress:   . Feeling of Stress : Not on file  Social Connections:   . Frequency of Communication with Friends and Family: Not on file  . Frequency of Social Gatherings with Friends and Family: Not on file  . Attends Religious Services: Not on file  . Active Member of Clubs or Organizations: Not on file  . Attends Archivist Meetings: Not on file  . Marital Status: Not on file  Intimate Partner Violence:   . Fear of Current or Ex-Partner: Not on file  . Emotionally Abused: Not on file  . Physically Abused: Not on file  . Sexually Abused: Not on file    Family History  Problem Relation Age of Onset  . Hypertension Mother   . Prostate cancer Father   . Hypertension Father   . Hypertension Maternal Grandmother   . Hypertension Maternal Grandfather   . Heart failure Paternal Grandmother     Review of Systems:  As stated in the HPI and otherwise negative.   BP 116/80   Pulse (!) 51   Ht 6' (1.829 m)   Wt 230 lb (104.3 kg)   SpO2 97%   BMI 31.19 kg/m   Physical Examination:  General: Well developed, well nourished, NAD  HEENT: OP clear, mucus membranes moist  SKIN: warm, dry. No rashes. Neuro: No focal deficits  Musculoskeletal: Muscle strength 5/5 all ext  Psychiatric:  Mood and affect normal  Neck: No JVD, no carotid bruits, no thyromegaly, no lymphadenopathy.  Lungs:Clear bilaterally, no wheezes, rhonci, crackles Cardiovascular: Regular rate and rhythm. No murmurs, gallops or rubs. Abdomen:Soft. Bowel sounds present. Non-tender.  Extremities: No lower extremity edema. Pulses are 2 + in the bilateral DP/PT.  Cardiac cath 07/21/17:  Mid RCA lesion is 100% stenosed.  Acute Mrg lesion is 50% stenosed.  A drug-eluting stent was successfully placed using a STENT SYNERGY DES 3X24.  Post intervention, there is a 0% residual stenosis.  Dist RCA lesion is 80% stenosed.  A drug-eluting stent was successfully placed using a STENT  SYNERGY DES 2.75X24.  Post intervention, there is a 0% residual stenosis.  Prox Cx to Mid Cx lesion is 30% stenosed.  Ramus lesion is 90% stenosed.  Ost Ramus lesion is 30% stenosed.  Ost 1st Diag lesion is 50% stenosed.  Prox LAD to Mid LAD lesion is 20% stenosed.  The left ventricular systolic function is normal.  LV end diastolic pressure is normal.  The left ventricular ejection fraction is greater than 65% by visual estimate.  There is no mitral valve regurgitation.  Echo 07/22/17: - Left ventricle: The cavity size was normal. There was moderate   concentric hypertrophy. Systolic function was normal. The   estimated ejection fraction was in the range of 60% to 65%. Wall   motion was normal; there were no regional wall motion   abnormalities. Doppler parameters are consistent with abnormal   left ventricular relaxation (grade 1 diastolic dysfunction). - Aortic valve: Transvalvular velocity was within the normal range.   There was no stenosis. There was mild regurgitation. Valve area   (Vmax): 1.94 cm^2. - Mitral valve: Transvalvular velocity was within the normal range.   There was no evidence for stenosis. There was no regurgitation.   Valve area by pressure half-time: 1.71 cm^2. - Right ventricle: The  cavity size was normal. Wall thickness was   normal. Systolic function was normal. - Atrial septum: No defect or patent foramen ovale was identified. - Tricuspid valve: There was no regurgitation. - Pericardium, extracardiac: There was a left pleural effusion.  EKG:  EKG is ordered today. The ekg ordered today demonstrates sinus bradycardia, rate 51 bpm  Recent Labs: 05/26/2019: TSH 1.140 03/08/2020: ALT 35; BUN 19; Creatinine, Ser 1.52; Hemoglobin 12.1; Platelets 272.0; Potassium 4.8; Sodium 139   Lipid Panel    Component Value Date/Time   CHOL 79 03/08/2020 0944   CHOL 85 (L) 12/30/2019 0729   TRIG 112.0 03/08/2020 0944   HDL 14.50 (L) 03/08/2020 0944   HDL 18 (L) 12/30/2019 0729   CHOLHDL 5 03/08/2020 0944   VLDL 22.4 03/08/2020 0944   LDLCALC 42 03/08/2020 0944   LDLCALC 45 12/30/2019 0729   LDLDIRECT 93.0 03/31/2018 0904     Wt Readings from Last 3 Encounters:  05/13/20 230 lb (104.3 kg)  03/08/20 237 lb 9.6 oz (107.8 kg)  05/26/19 245 lb 6.4 oz (111.3 kg)     Other studies Reviewed: Additional studies/ records that were reviewed today include: . Review of the above records demonstrates:    Assessment and Plan:   1. CAD without angina: No chest pain. Continue ASA, Plavix, statin and beta blocker.  Refill NTG today  2. HTN: BP controlled. No changes  3. HLD: LDL 42 July 2021. Continue statin  Current medicines are reviewed at length with the patient today.  The patient does not have concerns regarding medicines.  The following changes have been made:  no change  Labs/ tests ordered today include:   Orders Placed This Encounter  Procedures  . EKG 12-Lead   Disposition:   FU with me in 12 months  Signed, Lauree Chandler, MD 05/13/2020 [facility] [address], [place], [place]  [address] Phone: [phone]; Fax: [phone]

## 2020-05-25 ENCOUNTER — Telehealth: Payer: BC Managed Care – PPO | Admitting: Physician Assistant

## 2020-05-27 DIAGNOSIS — M7651 Patellar tendinitis, right knee: Secondary | ICD-10-CM | POA: Diagnosis not present

## 2020-06-01 ENCOUNTER — Other Ambulatory Visit: Payer: Self-pay | Admitting: Cardiovascular Disease

## 2020-06-05 DIAGNOSIS — M25561 Pain in right knee: Secondary | ICD-10-CM | POA: Diagnosis not present

## 2020-06-13 ENCOUNTER — Other Ambulatory Visit: Payer: Self-pay | Admitting: Cardiovascular Disease

## 2020-06-16 NOTE — Telephone Encounter (Signed)
LFT were checked again in July and were stable.

## 2020-07-01 ENCOUNTER — Other Ambulatory Visit: Payer: Self-pay | Admitting: Cardiovascular Disease

## 2020-07-01 DIAGNOSIS — M7651 Patellar tendinitis, right knee: Secondary | ICD-10-CM | POA: Diagnosis not present

## 2020-08-01 DIAGNOSIS — J029 Acute pharyngitis, unspecified: Secondary | ICD-10-CM | POA: Diagnosis not present

## 2020-08-01 DIAGNOSIS — M791 Myalgia, unspecified site: Secondary | ICD-10-CM | POA: Diagnosis not present

## 2020-08-01 DIAGNOSIS — R519 Headache, unspecified: Secondary | ICD-10-CM | POA: Diagnosis not present

## 2020-08-05 DIAGNOSIS — J029 Acute pharyngitis, unspecified: Secondary | ICD-10-CM | POA: Diagnosis not present

## 2020-08-18 ENCOUNTER — Other Ambulatory Visit: Payer: Self-pay | Admitting: Family Medicine

## 2020-08-18 DIAGNOSIS — IMO0002 Reserved for concepts with insufficient information to code with codable children: Secondary | ICD-10-CM

## 2020-08-18 DIAGNOSIS — E1151 Type 2 diabetes mellitus with diabetic peripheral angiopathy without gangrene: Secondary | ICD-10-CM

## 2020-08-22 ENCOUNTER — Encounter: Payer: Self-pay | Admitting: Family Medicine

## 2020-08-22 ENCOUNTER — Other Ambulatory Visit: Payer: Self-pay | Admitting: *Deleted

## 2020-08-22 MED ORDER — ONETOUCH DELICA LANCETS 33G MISC: 1 refills | Status: AC

## 2020-08-22 MED ORDER — ONETOUCH VERIO FLEX SYSTEM W/DEVICE KIT: PACK | 0 refills | Status: AC

## 2020-08-22 MED ORDER — ONETOUCH VERIO VI STRP: ORAL_STRIP | 1 refills | Status: AC

## 2020-08-29 ENCOUNTER — Other Ambulatory Visit: Payer: Self-pay | Admitting: Family Medicine

## 2020-09-27 ENCOUNTER — Other Ambulatory Visit: Payer: Self-pay | Admitting: Family Medicine

## 2020-09-27 DIAGNOSIS — E782 Mixed hyperlipidemia: Secondary | ICD-10-CM

## 2020-09-30 ENCOUNTER — Other Ambulatory Visit: Payer: Self-pay | Admitting: Family Medicine

## 2020-10-13 ENCOUNTER — Other Ambulatory Visit: Payer: Self-pay | Admitting: Cardiovascular Disease

## 2020-10-13 MED ORDER — NEXLIZET 180-10 MG PO TABS: 1.0000 | ORAL_TABLET | Freq: Every day | ORAL | 2 refills | Status: DC

## 2020-10-23 ENCOUNTER — Other Ambulatory Visit: Payer: Self-pay | Admitting: Family Medicine

## 2020-10-23 DIAGNOSIS — E782 Mixed hyperlipidemia: Secondary | ICD-10-CM

## 2020-11-03 ENCOUNTER — Other Ambulatory Visit: Payer: Self-pay | Admitting: Family Medicine

## 2020-11-03 DIAGNOSIS — E782 Mixed hyperlipidemia: Secondary | ICD-10-CM

## 2020-11-20 ENCOUNTER — Other Ambulatory Visit: Payer: Self-pay | Admitting: Family Medicine

## 2020-11-21 NOTE — Telephone Encounter (Signed)
Pending appointment 11/22/20.

## 2020-11-22 ENCOUNTER — Encounter: Payer: Self-pay | Admitting: Family Medicine

## 2020-11-22 ENCOUNTER — Other Ambulatory Visit: Payer: Self-pay | Admitting: Family Medicine

## 2020-11-22 ENCOUNTER — Ambulatory Visit (INDEPENDENT_AMBULATORY_CARE_PROVIDER_SITE_OTHER): Payer: BC Managed Care – PPO | Admitting: Family Medicine

## 2020-11-22 ENCOUNTER — Other Ambulatory Visit: Payer: Self-pay

## 2020-11-22 VITALS — BP 142/88 | HR 69 | Temp 98.7°F | Resp 18 | Ht 72.0 in | Wt 234.2 lb

## 2020-11-22 DIAGNOSIS — Z1211 Encounter for screening for malignant neoplasm of colon: Secondary | ICD-10-CM

## 2020-11-22 DIAGNOSIS — E785 Hyperlipidemia, unspecified: Secondary | ICD-10-CM

## 2020-11-22 DIAGNOSIS — E782 Mixed hyperlipidemia: Secondary | ICD-10-CM

## 2020-11-22 DIAGNOSIS — I10 Essential (primary) hypertension: Secondary | ICD-10-CM

## 2020-11-22 DIAGNOSIS — E1165 Type 2 diabetes mellitus with hyperglycemia: Secondary | ICD-10-CM | POA: Diagnosis not present

## 2020-11-22 DIAGNOSIS — E1151 Type 2 diabetes mellitus with diabetic peripheral angiopathy without gangrene: Secondary | ICD-10-CM

## 2020-11-22 DIAGNOSIS — I25110 Atherosclerotic heart disease of native coronary artery with unstable angina pectoris: Secondary | ICD-10-CM

## 2020-11-22 DIAGNOSIS — Z1159 Encounter for screening for other viral diseases: Secondary | ICD-10-CM | POA: Diagnosis not present

## 2020-11-22 LAB — LIPID PANEL
Cholesterol: 188 mg/dL (ref 0–200)
HDL: 29.8 mg/dL — ABNORMAL LOW (ref >39.00)
LDL Cholesterol: 130 mg/dL — ABNORMAL HIGH (ref 0–99)
NonHDL: 158.4
Total CHOL/HDL Ratio: 6
Triglycerides: 142 mg/dL (ref 0.0–149.0)
VLDL: 28.4 mg/dL (ref 0.0–40.0)

## 2020-11-22 LAB — HEMOGLOBIN A1C: Hgb A1c MFr Bld: 6.4 % (ref 4.6–6.5)

## 2020-11-22 LAB — COMPREHENSIVE METABOLIC PANEL
ALT: 24 U/L (ref 0–53)
AST: 25 U/L (ref 0–37)
Albumin: 4.2 g/dL (ref 3.5–5.2)
Alkaline Phosphatase: 52 U/L (ref 39–117)
BUN: 16 mg/dL (ref 6–23)
CO2: 31 mEq/L (ref 19–32)
Calcium: 10 mg/dL (ref 8.4–10.5)
Chloride: 99 mEq/L (ref 96–112)
Creatinine, Ser: 1.52 mg/dL — ABNORMAL HIGH (ref 0.40–1.50)
GFR: 53.05 mL/min — ABNORMAL LOW (ref >60.00)
Glucose, Bld: 146 mg/dL — ABNORMAL HIGH (ref 70–99)
Potassium: 4.3 mEq/L (ref 3.5–5.1)
Sodium: 136 mEq/L (ref 135–145)
Total Bilirubin: 0.6 mg/dL (ref 0.2–1.2)
Total Protein: 7.3 g/dL (ref 6.0–8.3)

## 2020-11-22 NOTE — Progress Notes (Signed)
Subjective:    Patient ID: Chad Chase, male    DOB: May 24, 1971, 50 y.o.   MRN: [id number]  Chief Complaint  Patient presents with  . Follow-up    6 months- Concerns/ questions: Pt says his wife was thinking the Plavix was supposed to replace the Nexlizet. -No recent eye exams -TLC,RMA  . Hyperlipidemia    Taking Lipitor 80 mg daily, Taking Fenofibrate 160 mg daily, Taking Nexlizet 180-10 mg daily.   . Hypertension    Taking Coreg 12.5 mg BID.   . Diabetes    Taking Metformin 500 mg daily.    HPI Patient is in today for f/u dm, chol and htn.    HPI HYPERTENSION   Blood pressure range-not checking   Chest pain- no      Dyspnea- no Lightheadedness- no   Edema- no  Other side effects - no   Medication compliance: good Low salt diet- yes    DIABETES    Blood Sugar ranges-not checking  Polyuria- no New Visual problems- no  Hypoglycemic symptoms- no  Other side effects-no Medication compliance - good Last eye exam- due Foot exam- today   HYPERLIPIDEMIA  Medication compliance- good RUQ pain- no  Muscle aches- no Other side effects-no     Past Medical History:  Diagnosis Date  . CAD (coronary artery disease)   . Hypertension    10 years ago--treated with medication    Past Surgical History:  Procedure Laterality Date  . CORONARY STENT INTERVENTION N/A 07/23/2017   Procedure: CORONARY STENT INTERVENTION;  Surgeon: Burnell Blanks, MD;  Location: [facility];  Service: Cardiovascular;  Laterality: N/A;  . CORONARY/GRAFT ACUTE MI REVASCULARIZATION N/A 07/21/2017   Procedure: Coronary/Graft Acute MI Revascularization;  Surgeon: Burnell Blanks, MD;  Location: [facility];  Service: Cardiovascular;  Laterality: N/A;  . KNEE ARTHROSCOPY    . LEFT HEART CATH AND CORONARY ANGIOGRAPHY N/A 07/21/2017   Procedure: LEFT HEART CATH AND CORONARY ANGIOGRAPHY;  Surgeon: Burnell Blanks, MD;  Location: [facility];  Service:  Cardiovascular;  Laterality: N/A;  . LEFT HEART CATH AND CORONARY ANGIOGRAPHY N/A 07/23/2017   Procedure: LEFT HEART CATH AND CORONARY ANGIOGRAPHY;  Surgeon: Burnell Blanks, MD;  Location: [facility];  Service: Cardiovascular;  Laterality: N/A;    Family History  Problem Relation Age of Onset  . Hypertension Mother   . Prostate cancer Father   . Hypertension Father   . Hypertension Maternal Grandmother   . Hypertension Maternal Grandfather   . Heart failure Paternal Grandmother     Social History   Socioeconomic History  . Marital status: Married    Spouse name: Not on file  . Number of children: Not on file  . Years of education: Not on file  . Highest education level: Not on file  Occupational History  . Occupation: self employed    Fish farm manager: [employer]  Tobacco Use  . Smoking status: Never Smoker  . Smokeless tobacco: Never Used  Vaping Use  . Vaping Use: Never used  Substance and Sexual Activity  . Alcohol use: Not Currently  . Drug use: No  . Sexual activity: Yes    Partners: Female  Other Topics Concern  . Not on file  Social History Narrative  . Not on file   Social Determinants of Health   Financial Resource Strain: Not on file  Food Insecurity: Not on file  Transportation Needs: Not on file  Physical  Activity: Not on file  Stress: Not on file  Social Connections: Not on file  Intimate Partner Violence: Not on file    Outpatient Medications Prior to Visit  Medication Sig Dispense Refill  . aspirin 81 MG chewable tablet Chew 1 tablet (81 mg total) by mouth daily.    Marland Kitchen atorvastatin (LIPITOR) 80 MG tablet TAKE 1 TABLET (80 MG TOTAL) BY MOUTH DAILY AT 6 PM 90 tablet 3  . Bempedoic Acid-Ezetimibe (NEXLIZET) 180-10 MG TABS Take 1 tablet by mouth daily. 90 tablet 2  . blood glucose meter kit and supplies KIT Dispense based on patient and insurance preference. Use up to four times daily as directed. (FOR ICD-9 250.00, 250.01). 1 each 0  .  Blood Glucose Monitoring Suppl ([place]) w/Device KIT Use to check blood sugar once day.  Dx code: E11.9 1 kit 0  . carvedilol (COREG) 12.5 MG tablet TAKE 1 TABLET (12.5 MG TOTAL) BY MOUTH 2 (TWO) TIMES DAILY WITH A MEAL. 180 tablet 3  . clopidogrel (PLAVIX) 75 MG tablet TAKE 1 TABLET BY MOUTH EVERY DAY 90 tablet 3  . fenofibrate 160 MG tablet Take 1 tablet (160 mg total) by mouth daily. Pt needs OV for further refills 15 tablet 0  . glucose blood (ONETOUCH VERIO) test strip Use to check blood sugar once day.  Dx code: E11.9 100 each 1  . metFORMIN (GLUCOPHAGE) 500 MG tablet TAKE 1 TABLET BY MOUTH EVERY DAY WITH BREAKFAST 30 tablet 0  . nitroGLYCERIN (NITROSTAT) 0.4 MG SL tablet Place 1 tablet (0.4 mg total) under the tongue every 5 (five) minutes as needed. 25 tablet 4  . OneTouch Delica Lancets 02D MISC Use to check blood sugar once day.  Dx code: E11.9 100 each 1  . sildenafil (VIAGRA) 50 MG tablet Take 1 tablet (50 mg total) by mouth daily as needed for erectile dysfunction. Do not use within 48 hrs of nitroglycerin 10 tablet 1   No facility-administered medications prior to visit.    No Known Allergies  Review of Systems  Constitutional: Negative for chills, fever and malaise/fatigue.  HENT: Negative for congestion and hearing loss.   Eyes: Negative for discharge.  Respiratory: Negative for cough, sputum production and shortness of breath.   Cardiovascular: Negative for chest pain, palpitations and leg swelling.  Gastrointestinal: Negative for abdominal pain, blood in stool, constipation, diarrhea, heartburn, nausea and vomiting.  Genitourinary: Negative for dysuria, frequency, hematuria and urgency.  Musculoskeletal: Negative for back pain, falls and myalgias.  Skin: Negative for rash.  Neurological: Negative for dizziness, sensory change, loss of consciousness, weakness and headaches.  Endo/Heme/Allergies: Negative for environmental allergies. Does not bruise/bleed  easily.  Psychiatric/Behavioral: Negative for depression and suicidal ideas. The patient is not nervous/anxious and does not have insomnia.        Objective:    Physical Exam Vitals and nursing note reviewed.  Constitutional:      General: He is sleeping.     Appearance: He is well-developed.  HENT:     Head: Normocephalic and atraumatic.  Eyes:     Pupils: Pupils are equal, round, and reactive to light.  Neck:     Thyroid: No thyromegaly.  Cardiovascular:     Rate and Rhythm: Normal rate and regular rhythm.     Heart sounds: No murmur heard.   Pulmonary:     Effort: Pulmonary effort is normal. No respiratory distress.     Breath sounds: Normal breath sounds. No wheezing or rales.  Chest:     Chest wall: No tenderness.  Musculoskeletal:        General: No tenderness.     Cervical back: Normal range of motion and neck supple.  Skin:    General: Skin is warm and dry.  Neurological:     Mental Status: He is oriented to person, place, and time.  Psychiatric:        Behavior: Behavior normal.        Thought Content: Thought content normal.        Judgment: Judgment normal.     BP (!) 142/88 (BP Location: Left Arm, Patient Position: Sitting, Cuff Size: Normal)   Pulse 69   Temp 98.7 F (37.1 C) (Oral)   Resp 18   Ht 6' (1.829 m)   Wt 234 lb 3.2 oz (106.2 kg)   SpO2 98%   BMI 31.76 kg/m  Wt Readings from Last 3 Encounters:  11/22/20 234 lb 3.2 oz (106.2 kg)  05/13/20 230 lb (104.3 kg)  03/08/20 237 lb 9.6 oz (107.8 kg)    Diabetic Foot Exam - Simple   Simple Foot Form Diabetic Foot exam was performed with the following findings: Yes 11/22/2020  1:09 PM  Visual Inspection No deformities, no ulcerations, no other skin breakdown bilaterally: Yes Sensation Testing Intact to touch and monofilament testing bilaterally: Yes Pulse Check Posterior Tibialis and Dorsalis pulse intact bilaterally: Yes Comments    Lab Results  Component Value Date   WBC 4.5  03/08/2020   HGB 12.1 (L) 03/08/2020   HCT 37.0 (L) 03/08/2020   PLT 272.0 03/08/2020   GLUCOSE 122 (H) 03/08/2020   CHOL 79 03/08/2020   TRIG 112.0 03/08/2020   HDL 14.50 (L) 03/08/2020   LDLDIRECT 93.0 03/31/2018   LDLCALC 42 03/08/2020   ALT 35 03/08/2020   AST 39 (H) 03/08/2020   NA 139 03/08/2020   K 4.8 03/08/2020   CL 104 03/08/2020   CREATININE 1.52 (H) 03/08/2020   BUN 19 03/08/2020   CO2 29 03/08/2020   TSH 1.140 05/26/2019   PSA 0.38 06/21/2016   INR 0.94 07/22/2017   HGBA1C 7.1 (H) 03/08/2020   MICROALBUR <0.7 03/08/2020    Lab Results  Component Value Date   TSH 1.140 05/26/2019   Lab Results  Component Value Date   WBC 4.5 03/08/2020   HGB 12.1 (L) 03/08/2020   HCT 37.0 (L) 03/08/2020   MCV 87.8 03/08/2020   PLT 272.0 03/08/2020   Lab Results  Component Value Date   NA 139 03/08/2020   K 4.8 03/08/2020   CO2 29 03/08/2020   GLUCOSE 122 (H) 03/08/2020   BUN 19 03/08/2020   CREATININE 1.52 (H) 03/08/2020   BILITOT 0.6 03/08/2020   ALKPHOS 71 03/08/2020   AST 39 (H) 03/08/2020   ALT 35 03/08/2020   PROT 6.8 03/08/2020   ALBUMIN 4.0 03/08/2020   CALCIUM 9.5 03/08/2020   ANIONGAP 7 10/21/2018   GFR 59.05 (L) 03/08/2020   Lab Results  Component Value Date   CHOL 79 03/08/2020   Lab Results  Component Value Date   HDL 14.50 (L) 03/08/2020   Lab Results  Component Value Date   LDLCALC 42 03/08/2020   Lab Results  Component Value Date   TRIG 112.0 03/08/2020   Lab Results  Component Value Date   CHOLHDL 5 03/08/2020   Lab Results  Component Value Date   HGBA1C 7.1 (H) 03/08/2020       Assessment & Plan:  Problem List Items Addressed This Visit      Unprioritized   Coronary artery disease involving native coronary artery of native heart with unstable angina pectoris [facility])    Per cardiology On plavix , nexlizet      Hyperlipidemia - Primary    Tolerating statin, encouraged heart healthy diet, avoid trans fats, minimize  simple carbs and saturated fats. Increase exercise as tolerated      Relevant Orders   Lipid panel   Comprehensive metabolic panel   Hypertension    Well controlled, no changes to meds. Encouraged heart healthy diet such as the DASH diet and exercise as tolerated.  On coreg      Type II diabetes mellitus with peripheral circulatory disorder (HCC)    hgba1c to be checked, minimize simple carbs. Increase exercise as tolerated. Continue current meds       Other Visit Diagnoses    Type 2 diabetes mellitus with hyperglycemia, without long-term current use of insulin ([place])       Relevant Orders   Hemoglobin A1c   Comprehensive metabolic panel   Need for hepatitis C screening test       Relevant Orders   Hepatitis C antibody   Colon cancer screening       Relevant Orders   Ambulatory referral to Gastroenterology      I am having Chad Chase maintain his aspirin, blood glucose meter kit and supplies, sildenafil, nitroGLYCERIN, clopidogrel, carvedilol, atorvastatin, OneTouch Delica Lancets 66M, OneTouch Verio Flex System, OneTouch Verio, Nexlizet, fenofibrate, and metFORMIN.  No orders of the defined types were placed in this encounter.    Ann Held, DO

## 2020-11-22 NOTE — Assessment & Plan Note (Signed)
Tolerating statin, encouraged heart healthy diet, avoid trans fats, minimize simple carbs and saturated fats. Increase exercise as tolerated 

## 2020-11-22 NOTE — Assessment & Plan Note (Signed)
Well controlled, no changes to meds. Encouraged heart healthy diet such as the DASH diet and exercise as tolerated.  On coreg

## 2020-11-22 NOTE — Patient Instructions (Signed)

## 2020-11-22 NOTE — Assessment & Plan Note (Signed)
hgba1c to be checked, minimize simple carbs. Increase exercise as tolerated. Continue current meds  

## 2020-11-22 NOTE — Assessment & Plan Note (Signed)
Per cardiology On plavix , nexlizet

## 2020-11-23 LAB — HEPATITIS C ANTIBODY
Hepatitis C Ab: NONREACTIVE
SIGNAL TO CUT-OFF: 0.01 (ref <1.00)

## 2020-11-28 ENCOUNTER — Other Ambulatory Visit: Payer: Self-pay | Admitting: Family Medicine

## 2020-11-28 ENCOUNTER — Encounter: Payer: Self-pay | Admitting: Family Medicine

## 2020-11-28 DIAGNOSIS — N189 Chronic kidney disease, unspecified: Secondary | ICD-10-CM

## 2020-11-29 ENCOUNTER — Other Ambulatory Visit: Payer: Self-pay

## 2020-11-29 DIAGNOSIS — N189 Chronic kidney disease, unspecified: Secondary | ICD-10-CM

## 2020-12-13 ENCOUNTER — Other Ambulatory Visit: Payer: Self-pay | Admitting: Family Medicine

## 2020-12-13 DIAGNOSIS — E782 Mixed hyperlipidemia: Secondary | ICD-10-CM

## 2020-12-22 ENCOUNTER — Other Ambulatory Visit: Payer: Self-pay | Admitting: Family Medicine

## 2021-01-18 ENCOUNTER — Other Ambulatory Visit: Payer: Self-pay | Admitting: Family Medicine

## 2021-02-16 DIAGNOSIS — D509 Iron deficiency anemia, unspecified: Secondary | ICD-10-CM | POA: Diagnosis not present

## 2021-02-16 DIAGNOSIS — E1122 Type 2 diabetes mellitus with diabetic chronic kidney disease: Secondary | ICD-10-CM | POA: Diagnosis not present

## 2021-02-16 DIAGNOSIS — N1831 Chronic kidney disease, stage 3a: Secondary | ICD-10-CM | POA: Diagnosis not present

## 2021-02-16 DIAGNOSIS — I129 Hypertensive chronic kidney disease with stage 1 through stage 4 chronic kidney disease, or unspecified chronic kidney disease: Secondary | ICD-10-CM | POA: Diagnosis not present

## 2021-02-17 ENCOUNTER — Other Ambulatory Visit: Payer: Self-pay | Admitting: Nephrology

## 2021-02-17 DIAGNOSIS — I129 Hypertensive chronic kidney disease with stage 1 through stage 4 chronic kidney disease, or unspecified chronic kidney disease: Secondary | ICD-10-CM

## 2021-02-17 DIAGNOSIS — N1831 Chronic kidney disease, stage 3a: Secondary | ICD-10-CM

## 2021-03-02 ENCOUNTER — Other Ambulatory Visit: Payer: BC Managed Care – PPO

## 2021-03-13 ENCOUNTER — Other Ambulatory Visit: Payer: BC Managed Care – PPO

## 2021-03-14 DIAGNOSIS — Z0000 Encounter for general adult medical examination without abnormal findings: Secondary | ICD-10-CM | POA: Diagnosis not present

## 2021-03-16 DIAGNOSIS — L237 Allergic contact dermatitis due to plants, except food: Secondary | ICD-10-CM | POA: Diagnosis not present

## 2021-03-16 DIAGNOSIS — M542 Cervicalgia: Secondary | ICD-10-CM | POA: Diagnosis not present

## 2021-03-29 ENCOUNTER — Other Ambulatory Visit: Payer: BC Managed Care – PPO

## 2021-04-06 ENCOUNTER — Other Ambulatory Visit: Payer: Self-pay

## 2021-04-06 ENCOUNTER — Ambulatory Visit
Admission: RE | Admit: 2021-04-06 | Discharge: 2021-04-06 | Disposition: A | Discharge status: Home / Self Care | Payer: BC Managed Care – PPO | Source: Ambulatory Visit | Attending: Nephrology | Admitting: Nephrology

## 2021-04-06 DIAGNOSIS — N200 Calculus of kidney: Secondary | ICD-10-CM | POA: Diagnosis not present

## 2021-04-06 DIAGNOSIS — N1831 Chronic kidney disease, stage 3a: Secondary | ICD-10-CM

## 2021-04-06 DIAGNOSIS — I129 Hypertensive chronic kidney disease with stage 1 through stage 4 chronic kidney disease, or unspecified chronic kidney disease: Secondary | ICD-10-CM

## 2021-06-01 ENCOUNTER — Encounter: Payer: BC Managed Care – PPO | Admitting: Family Medicine

## 2021-06-07 ENCOUNTER — Other Ambulatory Visit: Payer: Self-pay | Admitting: Cardiovascular Disease

## 2021-06-07 ENCOUNTER — Other Ambulatory Visit: Payer: Self-pay | Admitting: Family Medicine

## 2021-06-07 DIAGNOSIS — E782 Mixed hyperlipidemia: Secondary | ICD-10-CM

## 2021-06-08 ENCOUNTER — Other Ambulatory Visit: Payer: Self-pay

## 2021-06-09 ENCOUNTER — Ambulatory Visit (INDEPENDENT_AMBULATORY_CARE_PROVIDER_SITE_OTHER): Payer: BC Managed Care – PPO | Admitting: Family Medicine

## 2021-06-09 ENCOUNTER — Encounter: Payer: Self-pay | Admitting: Family Medicine

## 2021-06-09 VITALS — BP 140/86 | HR 60 | Temp 98.0°F | Ht 71.0 in | Wt 244.0 lb

## 2021-06-09 DIAGNOSIS — Z1211 Encounter for screening for malignant neoplasm of colon: Secondary | ICD-10-CM | POA: Diagnosis not present

## 2021-06-09 DIAGNOSIS — E785 Hyperlipidemia, unspecified: Secondary | ICD-10-CM | POA: Diagnosis not present

## 2021-06-09 DIAGNOSIS — E1165 Type 2 diabetes mellitus with hyperglycemia: Secondary | ICD-10-CM

## 2021-06-09 DIAGNOSIS — I110 Hypertensive heart disease with heart failure: Secondary | ICD-10-CM | POA: Diagnosis not present

## 2021-06-09 DIAGNOSIS — Z0000 Encounter for general adult medical examination without abnormal findings: Secondary | ICD-10-CM

## 2021-06-09 LAB — CBC WITH DIFFERENTIAL/PLATELET
Basophils Absolute: 0 K/uL (ref 0.0–0.1)
Basophils Relative: 0.2 % (ref 0.0–3.0)
Eosinophils Absolute: 0.1 K/uL (ref 0.0–0.7)
Eosinophils Relative: 1.3 % (ref 0.0–5.0)
HCT: 38.3 % — ABNORMAL LOW (ref 39.0–52.0)
Hemoglobin: 12.4 g/dL — ABNORMAL LOW (ref 13.0–17.0)
Lymphocytes Relative: 40.5 % (ref 12.0–46.0)
Lymphs Abs: 1.6 K/uL (ref 0.7–4.0)
MCHC: 32.2 g/dL (ref 30.0–36.0)
MCV: 88.2 fl (ref 78.0–100.0)
Monocytes Absolute: 0.5 K/uL (ref 0.1–1.0)
Monocytes Relative: 12.8 % — ABNORMAL HIGH (ref 3.0–12.0)
Neutro Abs: 1.8 K/uL (ref 1.4–7.7)
Neutrophils Relative %: 45.2 % (ref 43.0–77.0)
Platelets: 302 K/uL (ref 150.0–400.0)
RBC: 4.35 Mil/uL (ref 4.22–5.81)
RDW: 14.2 % (ref 11.5–15.5)
WBC: 3.9 K/uL — ABNORMAL LOW (ref 4.0–10.5)

## 2021-06-09 LAB — COMPREHENSIVE METABOLIC PANEL WITH GFR
ALT: 22 U/L (ref 0–53)
AST: 23 U/L (ref 0–37)
Albumin: 4.1 g/dL (ref 3.5–5.2)
Alkaline Phosphatase: 45 U/L (ref 39–117)
BUN: 15 mg/dL (ref 6–23)
CO2: 28 meq/L (ref 19–32)
Calcium: 9.6 mg/dL (ref 8.4–10.5)
Chloride: 104 meq/L (ref 96–112)
Creatinine, Ser: 1.61 mg/dL — ABNORMAL HIGH (ref 0.40–1.50)
GFR: 49.33 mL/min — ABNORMAL LOW
Glucose, Bld: 95 mg/dL (ref 70–99)
Potassium: 4.5 meq/L (ref 3.5–5.1)
Sodium: 137 meq/L (ref 135–145)
Total Bilirubin: 0.5 mg/dL (ref 0.2–1.2)
Total Protein: 6.9 g/dL (ref 6.0–8.3)

## 2021-06-09 LAB — PSA: PSA: 0.24 ng/mL (ref 0.10–4.00)

## 2021-06-09 LAB — HEMOGLOBIN A1C: Hgb A1c MFr Bld: 6.9 % — ABNORMAL HIGH (ref 4.6–6.5)

## 2021-06-09 LAB — MICROALBUMIN / CREATININE URINE RATIO
Creatinine,U: 174.3 mg/dL
Microalb Creat Ratio: 0.4 mg/g (ref 0.0–30.0)
Microalb, Ur: <0.7 mg/dL (ref 0.0–1.9)

## 2021-06-09 LAB — LIPID PANEL
Cholesterol: 168 mg/dL (ref 0–200)
HDL: 32.6 mg/dL — ABNORMAL LOW (ref >39.00)
LDL Cholesterol: 118 mg/dL — ABNORMAL HIGH (ref 0–99)
NonHDL: 134.96
Total CHOL/HDL Ratio: 5
Triglycerides: 85 mg/dL (ref 0.0–149.0)
VLDL: 17 mg/dL (ref 0.0–40.0)

## 2021-06-09 LAB — TSH: TSH: 1.23 u[IU]/mL (ref 0.35–5.50)

## 2021-06-09 NOTE — Assessment & Plan Note (Signed)
hgba1c to be checked, minimize simple carbs. Increase exercise as tolerated. Continue current meds  

## 2021-06-09 NOTE — Progress Notes (Signed)
Subjective:   By signing my name below, I, Chad Chase, attest that this documentation has been prepared under the direction and in the presence of Ann Held, DO. 06/09/2021   Patient ID: Chad Chase, male    DOB: 29-Jul-1971, 50 y.o.   MRN: [id number]  Chief Complaint  Patient presents with   Annual Exam    cpe    HPI Patient is in today for a comprehensive physical exam.  He reports he is doing well at this time.  He does not check his blood sugar levels at home.  He denies fever, hearing loss, ear pain,congestion, sinus pain, sore throat, eye pain, chest pain, palpitations, cough, shortness of breath, wheezing, nausea. vomiting, diarrhea, constipation, blood in stool, dysuria,frequency, hematuria and headaches.   He just signed up for a gym membership and plans to get regular exercise.  He is not interested in getting the flu, shingles or Covid-19 vaccine.  There has been no recent changes in his family medical history. He has had no recent surgeries.  Past Medical History:  Diagnosis Date   CAD (coronary artery disease)    Hypertension    10 years ago--treated with medication    Past Surgical History:  Procedure Laterality Date   CORONARY STENT INTERVENTION N/A 07/23/2017   Procedure: CORONARY STENT INTERVENTION;  Surgeon: Burnell Blanks, MD;  Location: [facility];  Service: Cardiovascular;  Laterality: N/A;   CORONARY/GRAFT ACUTE MI REVASCULARIZATION N/A 07/21/2017   Procedure: Coronary/Graft Acute MI Revascularization;  Surgeon: Burnell Blanks, MD;  Location: [facility];  Service: Cardiovascular;  Laterality: N/A;   KNEE ARTHROSCOPY     LEFT HEART CATH AND CORONARY ANGIOGRAPHY N/A 07/21/2017   Procedure: LEFT HEART CATH AND CORONARY ANGIOGRAPHY;  Surgeon: Burnell Blanks, MD;  Location: [facility];  Service: Cardiovascular;  Laterality: N/A;   LEFT HEART CATH AND CORONARY ANGIOGRAPHY N/A 07/23/2017    Procedure: LEFT HEART CATH AND CORONARY ANGIOGRAPHY;  Surgeon: Burnell Blanks, MD;  Location: [facility];  Service: Cardiovascular;  Laterality: N/A;    Family History  Problem Relation Age of Onset   Hypertension Mother    Prostate cancer Father    Hypertension Father    Hypertension Maternal Grandmother    Hypertension Maternal Grandfather    Heart failure Paternal Grandmother     Social History   Socioeconomic History   Marital status: Married    Spouse name: Not on file   Number of children: Not on file   Years of education: Not on file   Highest education level: Not on file  Occupational History   Occupation: self employed    Employer: [employer]  Tobacco Use   Smoking status: Never   Smokeless tobacco: Never  Vaping Use   Vaping Use: Never used  Substance and Sexual Activity   Alcohol use: Not Currently   Drug use: No   Sexual activity: Yes    Partners: Female  Other Topics Concern   Not on file  Social History Narrative   Exercise---  just started at gym    Social Determinants of Health   Financial Resource Strain: Not on file  Food Insecurity: Not on file  Transportation Needs: Not on file  Physical Activity: Not on file  Stress: Not on file  Social Connections: Not on file  Intimate Partner Violence: Not on file    Outpatient Medications Prior to Visit  Medication Sig Dispense Refill  aspirin 81 MG chewable tablet Chew 1 tablet (81 mg total) by mouth daily.     atorvastatin (LIPITOR) 80 MG tablet TAKE 1 TABLET (80 MG TOTAL) BY MOUTH DAILY AT 6 PM 90 tablet 3   Bempedoic Acid-Ezetimibe (NEXLIZET) 180-10 MG TABS Take 1 tablet by mouth daily. 90 tablet 2   blood glucose meter kit and supplies KIT Dispense based on patient and insurance preference. Use up to four times daily as directed. (FOR ICD-9 250.00, 250.01). 1 each 0   Blood Glucose Monitoring Suppl ([place]) w/Device KIT Use to check blood sugar once day.  Dx  code: E11.9 1 kit 0   carvedilol (COREG) 12.5 MG tablet TAKE 1 TABLET (12.5 MG TOTAL) BY MOUTH 2 (TWO) TIMES DAILY WITH A MEAL. 180 tablet 3   clopidogrel (PLAVIX) 75 MG tablet TAKE 1 TABLET BY MOUTH EVERY DAY 90 tablet 1   fenofibrate 160 MG tablet TAKE 1 TABLET BY MOUTH EVERY DAY 90 tablet 1   glucose blood (ONETOUCH VERIO) test strip Use to check blood sugar once day.  Dx code: E11.9 100 each 1   metFORMIN (GLUCOPHAGE) 500 MG tablet Take 1 tablet (500 mg total) by mouth daily with breakfast. 90 tablet 1   nitroGLYCERIN (NITROSTAT) 0.4 MG SL tablet Place 1 tablet (0.4 mg total) under the tongue every 5 (five) minutes as needed. 25 tablet 4   OneTouch Delica Lancets 25D MISC Use to check blood sugar once day.  Dx code: E11.9 100 each 1   sildenafil (VIAGRA) 50 MG tablet Take 1 tablet (50 mg total) by mouth daily as needed for erectile dysfunction. Do not use within 48 hrs of nitroglycerin (Patient not taking: Reported on 06/09/2021) 10 tablet 1   No facility-administered medications prior to visit.    No Known Allergies  Review of Systems  Constitutional:  Negative for fever.  HENT:  Negative for congestion, ear pain, hearing loss, sinus pain and sore throat.   Eyes:  Negative for blurred vision and pain.  Respiratory:  Negative for cough, sputum production, shortness of breath and wheezing.   Cardiovascular:  Negative for chest pain and palpitations.  Gastrointestinal:  Negative for blood in stool, constipation, diarrhea, nausea and vomiting.  Genitourinary:  Negative for dysuria, frequency, hematuria and urgency.  Musculoskeletal:  Negative for back pain, falls and myalgias.  Neurological:  Negative for dizziness, sensory change, loss of consciousness, weakness and headaches.  Endo/Heme/Allergies:  Negative for environmental allergies. Does not bruise/bleed easily.  Psychiatric/Behavioral:  Negative for depression and suicidal ideas. The patient is not nervous/anxious and does not have  insomnia.       Objective:    Physical Exam Constitutional:      General: He is not in acute distress.    Appearance: Normal appearance. He is not ill-appearing.  HENT:     Head: Normocephalic and atraumatic.     Right Ear: Tympanic membrane, ear canal and external ear normal.     Left Ear: Tympanic membrane, ear canal and external ear normal.  Eyes:     Pupils: Pupils are equal, round, and reactive to light.  Cardiovascular:     Rate and Rhythm: Normal rate and regular rhythm.     Pulses: Normal pulses.     Heart sounds: No murmur heard.   No gallop.  Pulmonary:     Effort: Pulmonary effort is normal. No respiratory distress.     Breath sounds: Normal breath sounds. No wheezing or rhonchi.  Abdominal:  General: Bowel sounds are normal. There is no distension.     Palpations: Abdomen is soft.     Tenderness: There is no abdominal tenderness. There is no guarding.     Hernia: No hernia is present.  Genitourinary:    Comments: Prostate exam deferred Musculoskeletal:     Cervical back: Neck supple.  Lymphadenopathy:     Cervical: No cervical adenopathy.  Skin:    General: Skin is warm and dry.  Neurological:     Mental Status: He is alert and oriented to person, place, and time.    BP 140/86   Pulse 60   Temp 98 F (36.7 C) (Oral)   Ht 5' 11" (1.803 m)   Wt 244 lb (110.7 kg)   SpO2 97%   BMI 34.03 kg/m  Wt Readings from Last 3 Encounters:  06/09/21 244 lb (110.7 kg)  11/22/20 234 lb 3.2 oz (106.2 kg)  05/13/20 230 lb (104.3 kg)    Diabetic Foot Exam - Simple   No data filed    Lab Results  Component Value Date   WBC 4.5 03/08/2020   HGB 12.1 (L) 03/08/2020   HCT 37.0 (L) 03/08/2020   PLT 272.0 03/08/2020   GLUCOSE 146 (H) 11/22/2020   CHOL 188 11/22/2020   TRIG 142.0 11/22/2020   HDL 29.80 (L) 11/22/2020   LDLDIRECT 93.0 03/31/2018   LDLCALC 130 (H) 11/22/2020   ALT 24 11/22/2020   AST 25 11/22/2020   NA 136 11/22/2020   K 4.3 11/22/2020   CL  99 11/22/2020   CREATININE 1.52 (H) 11/22/2020   BUN 16 11/22/2020   CO2 31 11/22/2020   TSH 1.140 05/26/2019   PSA 0.38 06/21/2016   INR 0.94 07/22/2017   HGBA1C 6.4 11/22/2020   MICROALBUR <0.7 03/08/2020    Lab Results  Component Value Date   TSH 1.140 05/26/2019   Lab Results  Component Value Date   WBC 4.5 03/08/2020   HGB 12.1 (L) 03/08/2020   HCT 37.0 (L) 03/08/2020   MCV 87.8 03/08/2020   PLT 272.0 03/08/2020   Lab Results  Component Value Date   NA 136 11/22/2020   K 4.3 11/22/2020   CO2 31 11/22/2020   GLUCOSE 146 (H) 11/22/2020   BUN 16 11/22/2020   CREATININE 1.52 (H) 11/22/2020   BILITOT 0.6 11/22/2020   ALKPHOS 52 11/22/2020   AST 25 11/22/2020   ALT 24 11/22/2020   PROT 7.3 11/22/2020   ALBUMIN 4.2 11/22/2020   CALCIUM 10.0 11/22/2020   ANIONGAP 7 10/21/2018   GFR 53.05 (L) 11/22/2020   Lab Results  Component Value Date   CHOL 188 11/22/2020   Lab Results  Component Value Date   HDL 29.80 (L) 11/22/2020   Lab Results  Component Value Date   LDLCALC 130 (H) 11/22/2020   Lab Results  Component Value Date   TRIG 142.0 11/22/2020   Lab Results  Component Value Date   CHOLHDL 6 11/22/2020   Lab Results  Component Value Date   HGBA1C 6.4 11/22/2020        PSA: Not completed  Colonoscopy: Not completed   Assessment & Plan:   Problem List Items Addressed This Visit       Unprioritized   Hyperlipidemia    Encourage heart healthy diet such as MIND or DASH diet, increase exercise, avoid trans fats, simple carbohydrates and processed foods, consider a krill or fish or flaxseed oil cap daily.       Hypertensive  heart disease with heart failure ([place])    Well controlled, no changes to meds. Encouraged heart healthy diet such as the DASH diet and exercise as tolerated.       Preventative health care - Primary   Relevant Orders   CBC with Differential/Platelet   Comprehensive metabolic panel   Hemoglobin A1c   Lipid panel    Microalbumin / creatinine urine ratio   TSH   PSA   Type 2 diabetes mellitus with hyperglycemia, without long-term current use of insulin (HCC)    hgba1c to be checked, minimize simple carbs. Increase exercise as tolerated. Continue current meds       Relevant Orders   Ambulatory referral to Ophthalmology   Other Visit Diagnoses     Colon cancer screening       Relevant Orders   Ambulatory referral to Gastroenterology        No orders of the defined types were placed in this encounter.   I,Chad Chase,acting as a Education administrator for [employer], DO.,have documented all relevant documentation on the behalf of Ann Held, DO,as directed by  Ann Held, DO while in the presence of [place], DO. , personally preformed the services described in this documentation.  All medical record entries made by the scribe were at my direction and in my presence.  I have reviewed the chart and discharge instructions (if applicable) and agree that the record reflects my personal performance and is accurate and complete. 06/09/2021

## 2021-06-09 NOTE — Patient Instructions (Signed)
Preventive Care 40-51 Years Old, Male Preventive care refers to lifestyle choices and visits with your health care provider that can promote health and wellness. This includes: A yearly physical exam. This is also called an annual wellness visit. Regular dental and eye exams. Immunizations. Screening for certain conditions. Healthy lifestyle choices, such as: Eating a healthy diet. Getting regular exercise. Not using drugs or products that contain nicotine and tobacco. Limiting alcohol use. What can I expect for my preventive care visit? Physical exam Your health care provider will check your: Height and weight. These may be used to calculate your BMI (body mass index). BMI is a measurement that tells if you are at a healthy weight. Heart rate and blood pressure. Body temperature. Skin for abnormal spots. Counseling Your health care provider may ask you questions about your: Past medical problems. Family's medical history. Alcohol, tobacco, and drug use. Emotional well-being. Home life and relationship well-being. Sexual activity. Diet, exercise, and sleep habits. Work and work environment. Access to firearms. What immunizations do I need? Vaccines are usually given at various ages, according to a schedule. Your health care provider will recommend vaccines for you based on your age, medical history, and lifestyle or other factors, such as travel or where you work. What tests do I need? Blood tests Lipid and cholesterol levels. These may be checked every 5 years, or more often if you are over 50 years old. Hepatitis C test. Hepatitis B test. Screening Lung cancer screening. You may have this screening every year starting at age 55 if you have a 30-pack-year history of smoking and currently smoke or have quit within the past 15 years. Prostate cancer screening. Recommendations will vary depending on your family history and other risks. Genital exam to check for testicular cancer  or hernias. Colorectal cancer screening. All adults should have this screening starting at age 50 and continuing until age 75. Your health care provider may recommend screening at age 45 if you are at increased risk. You will have tests every 1-10 years, depending on your results and the type of screening test. Diabetes screening. This is done by checking your blood sugar (glucose) after you have not eaten for a while (fasting). You may have this done every 1-3 years. STD (sexually transmitted disease) testing, if you are at risk. Follow these instructions at home: Eating and drinking  Eat a diet that includes fresh fruits and vegetables, whole grains, lean protein, and low-fat dairy products. Take vitamin and mineral supplements as recommended by your health care provider. Do not drink alcohol if your health care provider tells you not to drink. If you drink alcohol: Limit how much you have to 0-2 drinks a day. Be aware of how much alcohol is in your drink. In the [place] one drink equals one 12 oz bottle of beer (355 mL), one 5 oz glass of wine (148 mL), or one 1 oz glass of hard liquor (44 mL). Lifestyle Take daily care of your teeth and gums. Brush your teeth every morning and night with fluoride toothpaste. Floss one time each day. Stay active. Exercise for at least 30 minutes 5 or more days each week. Do not use any products that contain nicotine or tobacco, such as cigarettes, e-cigarettes, and chewing tobacco. If you need help quitting, ask your health care provider. Do not use drugs. If you are sexually active, practice safe sex. Use a condom or other form of protection to prevent STIs (sexually transmitted infections). If told by your   health care provider, take low-dose aspirin daily starting at age 50. Find healthy ways to cope with stress, such as: Meditation, yoga, or listening to music. Journaling. Talking to a trusted person. Spending time with friends and  family. Safety Always wear your seat belt while driving or riding in a vehicle. Do not drive: If you have been drinking alcohol. Do not ride with someone who has been drinking. When you are tired or distracted. While texting. Wear a helmet and other protective equipment during sports activities. If you have firearms in your house, make sure you follow all gun safety procedures. What's next? Go to your health care provider once a year for an annual wellness visit. Ask your health care provider how often you should have your eyes and teeth checked. Stay up to date on all vaccines. This information is not intended to replace advice given to you by your health care provider. Make sure you discuss any questions you have with your health care provider. Document Revised: 10/21/2020 Document Reviewed: 08/07/2018 Elsevier Patient Education  2022 Elsevier Inc.   

## 2021-06-09 NOTE — Assessment & Plan Note (Signed)
Well controlled, no changes to meds. Encouraged heart healthy diet such as the DASH diet and exercise as tolerated.  °

## 2021-06-09 NOTE — Assessment & Plan Note (Signed)
Encourage heart healthy diet such as MIND or DASH diet, increase exercise, avoid trans fats, simple carbohydrates and processed foods, consider a krill or fish or flaxseed oil cap daily.  °

## 2021-06-09 NOTE — Assessment & Plan Note (Signed)
ghm utd Check labs  See avs  

## 2021-06-12 ENCOUNTER — Encounter: Payer: Self-pay | Admitting: Family Medicine

## 2021-06-23 ENCOUNTER — Other Ambulatory Visit: Payer: Self-pay | Admitting: Cardiovascular Disease

## 2021-07-03 ENCOUNTER — Other Ambulatory Visit: Payer: Self-pay | Admitting: Family Medicine

## 2021-07-03 ENCOUNTER — Encounter: Payer: Self-pay | Admitting: Family Medicine

## 2021-07-03 DIAGNOSIS — D649 Anemia, unspecified: Secondary | ICD-10-CM

## 2021-07-03 NOTE — Telephone Encounter (Signed)
Pt last had labs on 06/09/21. Hgb was little bit low. Please advise

## 2021-08-01 ENCOUNTER — Other Ambulatory Visit: Payer: Self-pay | Admitting: Family Medicine

## 2021-08-29 DIAGNOSIS — E559 Vitamin D deficiency, unspecified: Secondary | ICD-10-CM | POA: Diagnosis not present

## 2021-08-29 DIAGNOSIS — I129 Hypertensive chronic kidney disease with stage 1 through stage 4 chronic kidney disease, or unspecified chronic kidney disease: Secondary | ICD-10-CM | POA: Diagnosis not present

## 2021-08-29 DIAGNOSIS — E1122 Type 2 diabetes mellitus with diabetic chronic kidney disease: Secondary | ICD-10-CM | POA: Diagnosis not present

## 2021-08-29 DIAGNOSIS — D509 Iron deficiency anemia, unspecified: Secondary | ICD-10-CM | POA: Diagnosis not present

## 2021-08-29 DIAGNOSIS — N1831 Chronic kidney disease, stage 3a: Secondary | ICD-10-CM | POA: Diagnosis not present

## 2021-09-26 ENCOUNTER — Encounter: Payer: Self-pay | Admitting: Cardiovascular Disease

## 2021-09-26 ENCOUNTER — Other Ambulatory Visit: Payer: Self-pay

## 2021-09-26 ENCOUNTER — Ambulatory Visit (INDEPENDENT_AMBULATORY_CARE_PROVIDER_SITE_OTHER): Payer: BC Managed Care – PPO | Admitting: Cardiovascular Disease

## 2021-09-26 VITALS — BP 122/78 | HR 57 | Ht 71.0 in | Wt 242.0 lb

## 2021-09-26 DIAGNOSIS — E785 Hyperlipidemia, unspecified: Secondary | ICD-10-CM | POA: Diagnosis not present

## 2021-09-26 DIAGNOSIS — I2510 Atherosclerotic heart disease of native coronary artery without angina pectoris: Secondary | ICD-10-CM

## 2021-09-26 DIAGNOSIS — I10 Essential (primary) hypertension: Secondary | ICD-10-CM

## 2021-09-26 MED ORDER — NITROGLYCERIN 0.4 MG SL SUBL: 0.4000 mg | SUBLINGUAL_TABLET | SUBLINGUAL | 4 refills | Status: AC | PRN

## 2021-09-26 MED ORDER — ROSUVASTATIN CALCIUM 40 MG PO TABS: 40.0000 mg | ORAL_TABLET | Freq: Every day | ORAL | 3 refills | Status: DC

## 2021-09-26 NOTE — Progress Notes (Signed)
Chief Complaint  Patient presents with   Follow-up    CAD   History of Present Illness: 51 yo male with history of CAD and HTN who is here today for follow up. He was admitted to [facility] November 2018 with an inferior STEMI secondary to an occluded RCA. Two drug eluting stents were placed in the RCA. Staged PCI of the intermediate branch with placement of a drug eluting stent. During the staged PCI, he was found to have occlusion of the mid RCA stents, likely due to stent thrombosis from high thrombus burden and poor distal runoff due to embolization. Flow could not be restored down the RCA on repeated PCI. Echo November 2018 with LVEF=60-65%, mild MR. He did well following his MI and completed cardiac rehab. BP and HR low in January 2019 at rehab so Coreg dose was lowered and Norvasc was stopped. He was last seen in our office in September 2021 and was doing well.   He is here today for follow up. The patient denies any chest pain, dyspnea, palpitations, lower extremity edema, orthopnea, PND, dizziness, near syncope or syncope.   Primary Care Physician: Carollee Herter, Alferd Apa, DO  Past Medical History:  Diagnosis Date   CAD (coronary artery disease)    Hypertension    10 years ago--treated with medication    Past Surgical History:  Procedure Laterality Date   CORONARY STENT INTERVENTION N/A 07/23/2017   Procedure: CORONARY STENT INTERVENTION;  Surgeon: Burnell Blanks, MD;  Location: [place] [facility];  Service: Cardiovascular;  Laterality: N/A;   CORONARY/GRAFT ACUTE MI REVASCULARIZATION N/A 07/21/2017   Procedure: Coronary/Graft Acute MI Revascularization;  Surgeon: Burnell Blanks, MD;  Location: [place] [facility];  Service: Cardiovascular;  Laterality: N/A;   KNEE ARTHROSCOPY     LEFT HEART CATH AND CORONARY ANGIOGRAPHY N/A 07/21/2017   Procedure: LEFT HEART CATH AND CORONARY ANGIOGRAPHY;  Surgeon: Burnell Blanks, MD;  Location: [place] [facility];  Service:  Cardiovascular;  Laterality: N/A;   LEFT HEART CATH AND CORONARY ANGIOGRAPHY N/A 07/23/2017   Procedure: LEFT HEART CATH AND CORONARY ANGIOGRAPHY;  Surgeon: Burnell Blanks, MD;  Location: [place] [facility];  Service: Cardiovascular;  Laterality: N/A;    Current Outpatient Medications  Medication Sig Dispense Refill   aspirin 81 MG chewable tablet Chew 1 tablet (81 mg total) by mouth daily.     Bempedoic Acid-Ezetimibe (NEXLIZET) 180-10 MG TABS Take 1 tablet by mouth daily. 90 tablet 2   blood glucose meter kit and supplies KIT Dispense based on patient and insurance preference. Use up to four times daily as directed. (FOR ICD-9 250.00, 250.01). 1 each 0   Blood Glucose Monitoring Suppl ([place]) w/Device KIT Use to check blood sugar once day.  Dx code: E11.9 1 kit 0   carvedilol (COREG) 12.5 MG tablet TAKE 1 TABLET (12.5 MG TOTAL) BY MOUTH 2 (TWO) TIMES DAILY WITH A MEAL. 180 tablet 3   fenofibrate 160 MG tablet TAKE 1 TABLET BY MOUTH EVERY DAY 90 tablet 1   glucose blood (ONETOUCH VERIO) test strip Use to check blood sugar once day.  Dx code: E11.9 100 each 1   metFORMIN (GLUCOPHAGE) 500 MG tablet TAKE 1 TABLET BY MOUTH EVERY DAY WITH BREAKFAST 90 tablet 1   OneTouch Delica Lancets 89V MISC Use to check blood sugar once day.  Dx code: E11.9 100 each 1   rosuvastatin (CRESTOR) 40 MG tablet Take 1 tablet (40 mg  total) by mouth daily. 90 tablet 3   nitroGLYCERIN (NITROSTAT) 0.4 MG SL tablet Place 1 tablet (0.4 mg total) under the tongue every 5 (five) minutes as needed. 25 tablet 4   No current facility-administered medications for this visit.    No Known Allergies  Social History   Socioeconomic History   Marital status: Married    Spouse name: Not on file   Number of children: Not on file   Years of education: Not on file   Highest education level: Not on file  Occupational History   Occupation: self employed    Employer: [employer]  Tobacco Use    Smoking status: Never   Smokeless tobacco: Never  Vaping Use   Vaping Use: Never used  Substance and Sexual Activity   Alcohol use: Not Currently   Drug use: No   Sexual activity: Yes    Partners: Female  Other Topics Concern   Not on file  Social History Narrative   Exercise---  just started at gym    Social Determinants of Health   Financial Resource Strain: Not on file  Food Insecurity: Not on file  Transportation Needs: Not on file  Physical Activity: Not on file  Stress: Not on file  Social Connections: Not on file  Intimate Partner Violence: Not on file    Family History  Problem Relation Age of Onset   Hypertension Mother    Prostate cancer Father    Hypertension Father    Hypertension Maternal Grandmother    Hypertension Maternal Grandfather    Heart failure Paternal Grandmother     Review of Systems:  As stated in the HPI and otherwise negative.   BP 122/78    Pulse (!) 57    Ht '5\' 11"'  (1.803 m)    Wt 242 lb (109.8 kg)    SpO2 98%    BMI 33.75 kg/m   Physical Examination: General: Well developed, well nourished, NAD  HEENT: OP clear, mucus membranes moist  SKIN: warm, dry. No rashes. Neuro: No focal deficits  Musculoskeletal: Muscle strength 5/5 all ext  Psychiatric: Mood and affect normal  Neck: No JVD, no carotid bruits, no thyromegaly, no lymphadenopathy.  Lungs:Clear bilaterally, no wheezes, rhonci, crackles Cardiovascular: Regular rate and rhythm. No murmurs, gallops or rubs. Abdomen:Soft. Bowel sounds present. Non-tender.  Extremities: No lower extremity edema. Pulses are 2 + in the bilateral DP/PT.  Cardiac cath 07/21/17: Mid RCA lesion is 100% stenosed. Acute Mrg lesion is 50% stenosed. A drug-eluting stent was successfully placed using a STENT SYNERGY DES 3X24. Post intervention, there is a 0% residual stenosis. Dist RCA lesion is 80% stenosed. A drug-eluting stent was successfully placed using a STENT SYNERGY DES 2.75X24. Post  intervention, there is a 0% residual stenosis. Prox Cx to Mid Cx lesion is 30% stenosed. Ramus lesion is 90% stenosed. Ost Ramus lesion is 30% stenosed. Ost 1st Diag lesion is 50% stenosed. Prox LAD to Mid LAD lesion is 20% stenosed. The left ventricular systolic function is normal. LV end diastolic pressure is normal. The left ventricular ejection fraction is greater than 65% by visual estimate. There is no mitral valve regurgitation.  Echo 07/22/17: - Left ventricle: The cavity size was normal. There was moderate   concentric hypertrophy. Systolic function was normal. The   estimated ejection fraction was in the range of 60% to 65%. Wall   motion was normal; there were no regional wall motion   abnormalities. Doppler parameters are consistent with abnormal  left ventricular relaxation (grade 1 diastolic dysfunction). - Aortic valve: Transvalvular velocity was within the normal range.   There was no stenosis. There was mild regurgitation. Valve area   (Vmax): 1.94 cm^2. - Mitral valve: Transvalvular velocity was within the normal range.   There was no evidence for stenosis. There was no regurgitation.   Valve area by pressure half-time: 1.71 cm^2. - Right ventricle: The cavity size was normal. Wall thickness was   normal. Systolic function was normal. - Atrial septum: No defect or patent foramen ovale was identified. - Tricuspid valve: There was no regurgitation. - Pericardium, extracardiac: There was a left pleural effusion.  EKG:  EKG is ordered today. The ekg ordered today demonstrates sinus  Recent Labs: 06/09/2021: ALT 22; BUN 15; Creatinine, Ser 1.61; Hemoglobin 12.4; Platelets 302.0; Potassium 4.5; Sodium 137; TSH 1.23   Lipid Panel    Component Value Date/Time   CHOL 168 06/09/2021 1054   CHOL 85 (L) 12/30/2019 0729   TRIG 85.0 06/09/2021 1054   HDL 32.60 (L) 06/09/2021 1054   HDL 18 (L) 12/30/2019 0729   CHOLHDL 5 06/09/2021 1054   VLDL 17.0 06/09/2021 1054    LDLCALC 118 (H) 06/09/2021 1054   LDLCALC 45 12/30/2019 0729   LDLDIRECT 93.0 03/31/2018 0904     Wt Readings from Last 3 Encounters:  09/26/21 242 lb (109.8 kg)  06/09/21 244 lb (110.7 kg)  11/22/20 234 lb 3.2 oz (106.2 kg)     Other studies Reviewed: Additional studies/ records that were reviewed today include: . Review of the above records demonstrates:    Assessment and Plan:   1. CAD without angina: He has no chest pain. Will continue ASA, statin and beta blocker. Stop Plavix.   2. HTN: BP is well controlled. No changes today  3. HLD: LDL 118 in October 2022. I will change Lipitor to Crestor 40 mg daily. He will begin exercising and watching his diet. Repeat lipids and LFTs in 12 weeks. If LDL not at goal, will consider Repatha.   Current medicines are reviewed at length with the patient today.  The patient does not have concerns regarding medicines.  The following changes have been made:  no change  Labs/ tests ordered today include:   Orders Placed This Encounter  Procedures   Hepatic function panel   Lipid panel   EKG 12-Lead   Disposition:   FU with me in 12 months  Signed, Lauree Chandler, MD 09/26/2021 3:36 PM    [facility] [address] [place], [place], [place]  [address] Phone: [phone]; Fax: [phone]

## 2021-09-26 NOTE — Patient Instructions (Addendum)
Medication Instructions:  1) Stop Atorvastatin   2) Stop Plavix   3) Start Rosuvastatin (Crestor) 40 mg daily    *If you need a refill on your cardiac medications before your next appointment, please call your pharmacy*   Lab Work: Your physician recommends that you return for a FASTING lipid profile and hepatic function test on Monday, April 24. Lab is open from 7:15 am to 4:45 pm   If you have labs (blood work) drawn today and your tests are completely normal, you will receive your results only by: MyChart Message (if you have MyChart) OR A paper copy in the mail If you have any lab test that is abnormal or we need to change your treatment, we will call you to review the results.   Testing/Procedures: None ordered    Follow-Up: At [facility], you and your health needs are our priority.  As part of our continuing mission to provide you with exceptional heart care, we have created designated Provider Care Teams.  These Care Teams include your primary Cardiologist (physician) and Advanced Practice Providers (APPs -  Physician Assistants and Nurse Practitioners) who all work together to provide you with the care you need, when you need it.  We recommend signing up for the patient portal called "MyChart".  Sign up information is provided on this After Visit Summary.  MyChart is used to connect with patients for Virtual Visits (Telemedicine).  Patients are able to view lab/test results, encounter notes, upcoming appointments, etc.  Non-urgent messages can be sent to your provider as well.   To learn more about what you can do with MyChart, go to [email or web address].    Your next appointment:   12 month(s)  The format for your next appointment:   In Person  Provider:   Verne Carrow, MD     Other Instructions None

## 2021-11-23 ENCOUNTER — Encounter: Payer: Self-pay | Admitting: Family Medicine

## 2021-11-23 NOTE — Telephone Encounter (Signed)
Virtual

## 2021-11-29 ENCOUNTER — Other Ambulatory Visit: Payer: Self-pay | Admitting: Cardiovascular Disease

## 2021-12-05 ENCOUNTER — Other Ambulatory Visit: Payer: Self-pay | Admitting: Family Medicine

## 2021-12-05 DIAGNOSIS — E782 Mixed hyperlipidemia: Secondary | ICD-10-CM

## 2021-12-18 ENCOUNTER — Other Ambulatory Visit: Payer: BC Managed Care – PPO | Admitting: *Deleted

## 2021-12-18 DIAGNOSIS — E785 Hyperlipidemia, unspecified: Secondary | ICD-10-CM | POA: Diagnosis not present

## 2021-12-19 LAB — LIPID PANEL
Chol/HDL Ratio: 4.9 ratio (ref 0.0–5.0)
Cholesterol, Total: 158 mg/dL (ref 100–199)
HDL: 32 mg/dL — ABNORMAL LOW (ref >39)
LDL Chol Calc (NIH): 107 mg/dL — ABNORMAL HIGH (ref 0–99)
Triglycerides: 105 mg/dL (ref 0–149)
VLDL Cholesterol Cal: 19 mg/dL (ref 5–40)

## 2021-12-19 LAB — HEPATIC FUNCTION PANEL
ALT: 34 IU/L (ref 0–44)
AST: 38 IU/L (ref 0–40)
Albumin: 4.2 g/dL (ref 3.8–4.9)
Alkaline Phosphatase: 58 IU/L (ref 44–121)
Bilirubin Total: 0.4 mg/dL (ref 0.0–1.2)
Bilirubin, Direct: 0.14 mg/dL (ref 0.00–0.40)
Total Protein: 7 g/dL (ref 6.0–8.5)

## 2021-12-22 ENCOUNTER — Other Ambulatory Visit: Payer: Self-pay | Admitting: Family Medicine

## 2021-12-28 ENCOUNTER — Telehealth: Payer: Self-pay | Admitting: *Deleted

## 2021-12-28 DIAGNOSIS — E785 Hyperlipidemia, unspecified: Secondary | ICD-10-CM

## 2021-12-28 NOTE — Telephone Encounter (Signed)
-----   Message from Kathleene Hazel, MD sent at 12/19/2021 11:18 AM EDT ----- ?LDL not at goal on crestor 40 mg daily. Can we refer him to the lipid clinic? Thanks, chris ?

## 2022-01-25 ENCOUNTER — Ambulatory Visit (INDEPENDENT_AMBULATORY_CARE_PROVIDER_SITE_OTHER): Payer: BC Managed Care – PPO | Admitting: Pharmacist

## 2022-01-25 DIAGNOSIS — E785 Hyperlipidemia, unspecified: Secondary | ICD-10-CM | POA: Diagnosis not present

## 2022-01-25 MED ORDER — NEXLIZET 180-10 MG PO TABS: 1.0000 | ORAL_TABLET | Freq: Every day | ORAL | 3 refills | Status: DC

## 2022-01-25 NOTE — Progress Notes (Signed)
Patient ID: Chad Chase                 DOB: Feb 22, 1971                    MRN: [id number]     HPI: Chad Chase is a 51 y.o. male patient referred to lipid clinic by Dr Angelena Form. PMH is significant for CAD s/p STEMI 06/2017 with 2 DES placed in RCA, DM, HLD, obesity, and HTN. Previously seen in lipid clinic in 2021 and started on Nexlizet as he wanted to avoid injectable therapy. LDL remains elevated and pt re-referred to lipid clinic.  Pt presents today for follow up. Reports tolerating rosuvastatin and fenofibrate. Has not been taking Nexlizet, thinks he stopped this at some point within the past year. Did not have any trouble tolerating it when he took it.  Current Medications: rosuvastatin 58m daily, fenofibrate 1651mdaily Risk Factors: premature ASCVD, DM, HTN LDL goal: <5513mL  Diet: skips breakfast and sometimes lunch. Dinner at home - chicken, steak, etc. [employer] once a week - big mac and fries.  Exercise: minimal  Family History: Mother, father, maternal grandmother and grandfather with HTN, paternal grandmother with HF.  Social History: Denies drug, alcohol and tobacco use.  Labs: 12/18/21: TC 158, TG 105, HDL 32, LDL 107 (rosuvastatin 32m19mily, fenofibrate 160mg57mly)  Past Medical History:  Diagnosis Date   CAD (coronary artery disease)    Hypertension    10 years ago--treated with medication    Current Outpatient Medications on File Prior to Visit  Medication Sig Dispense Refill   aspirin 81 MG chewable tablet Chew 1 tablet (81 mg total) by mouth daily.     Bempedoic Acid-Ezetimibe (NEXLIZET) 180-10 MG TABS Take 1 tablet by mouth daily. 90 tablet 2   blood glucose meter kit and supplies KIT Dispense based on patient and insurance preference. Use up to four times daily as directed. (FOR ICD-9 250.00, 250.01). 1 each 0   Blood Glucose Monitoring Suppl ([place] KIT Use to check blood sugar once day.  Dx code: E11.9 1 kit 0    carvedilol (COREG) 12.5 MG tablet TAKE 1 TABLET (12.5 MG TOTAL) BY MOUTH 2 (TWO) TIMES DAILY WITH A MEAL. 180 tablet 3   fenofibrate 160 MG tablet TAKE 1 TABLET BY MOUTH EVERY DAY 90 tablet 1   glucose blood (ONETOUCH VERIO) test strip Use to check blood sugar once day.  Dx code: E11.9 100 each 1   metFORMIN (GLUCOPHAGE) 500 MG tablet TAKE 1 TABLET BY MOUTH EVERY DAY WITH BREAKFAST 90 tablet 1   nitroGLYCERIN (NITROSTAT) 0.4 MG SL tablet Place 1 tablet (0.4 mg total) under the tongue every 5 (five) minutes as needed. 25 tablet 4   OneTouch Delica Lancets 33G M19F Use to check blood sugar once day.  Dx code: E11.9 100 each 1   rosuvastatin (CRESTOR) 40 MG tablet Take 1 tablet (40 mg total) by mouth daily. 90 tablet 3   No current facility-administered medications on file prior to visit.    No Known Allergies  Assessment/Plan:  1. Hyperlipidemia - LDL 107 on rosuvastatin 32mg 30my, above goal < 55 given hx of premature ASCVD + DM. Discussed adding either Nexlizet or PCSK9i today, pt prefers oral therapy. Will resume Nexlizet 180-10mg d41m. Prior auth approved through 01/24/23. Pt aware to contact company to re-activate copay card. Will also stop fenofibrate as TG have been historically well controlled. Discussed adding  Vascepa for added CV benefit. Pt wishes to see how follow up labs look first before adding another medication. Will recheck fasting labs in 2 months.  Chad Chase, PharmD, BCACP, [facility] [address] [place], [place] [address] Phone: [phone]; Fax: [phone] 01/25/2022 9:27 AM

## 2022-01-25 NOTE — Patient Instructions (Addendum)
Your LDL cholesterol is 107 and your goal is < 55  Continue taking rosuvastatin (Crestor) each day  Stop taking fenofibrate  I'll send information to your insurance to restart your Nexlizet. This is a tablet taken once daily  We'll plan to recheck labs on Monday, August 7th. You can come in any time after 7:30am for fasting lab work

## 2022-03-13 ENCOUNTER — Encounter: Payer: Self-pay | Admitting: Family Medicine

## 2022-03-19 IMAGING — US US RENAL
2 series · 14 of 25 positions shown · non-contrast
Comparison: None.

CLINICAL DATA: Stage III renal disease

EXAM:
RENAL / URINARY TRACT ULTRASOUND COMPLETE

[Series 1: us renal · 0.28mm/px · 12 of 50 slices shown (1 of 2)]
[im 1/50]
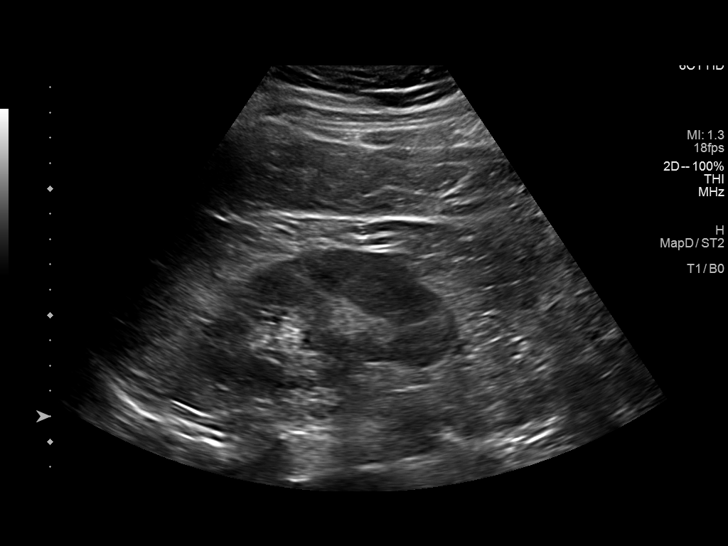
[im 5/50]
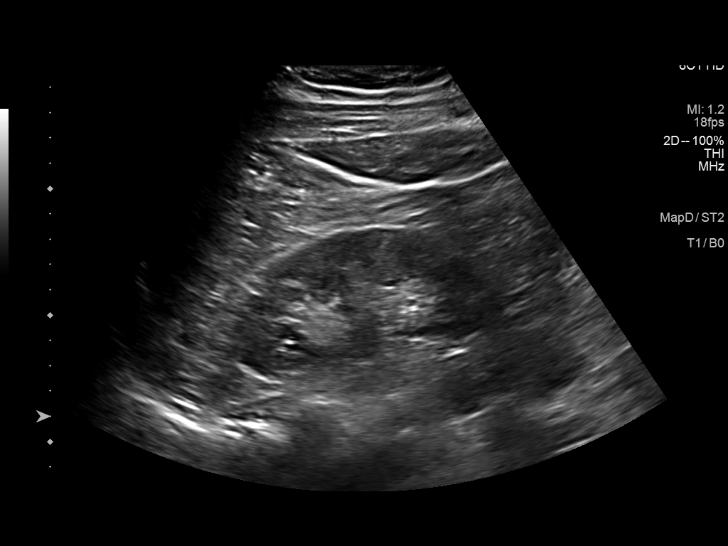
[im 10/50]
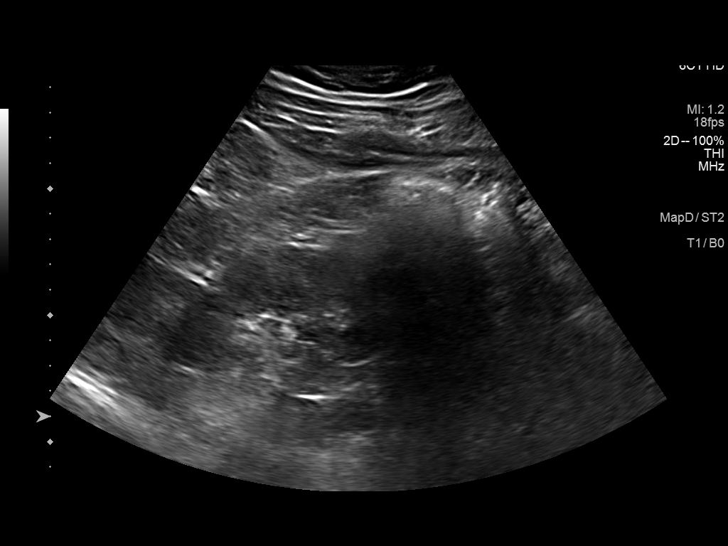
[im 15/50]
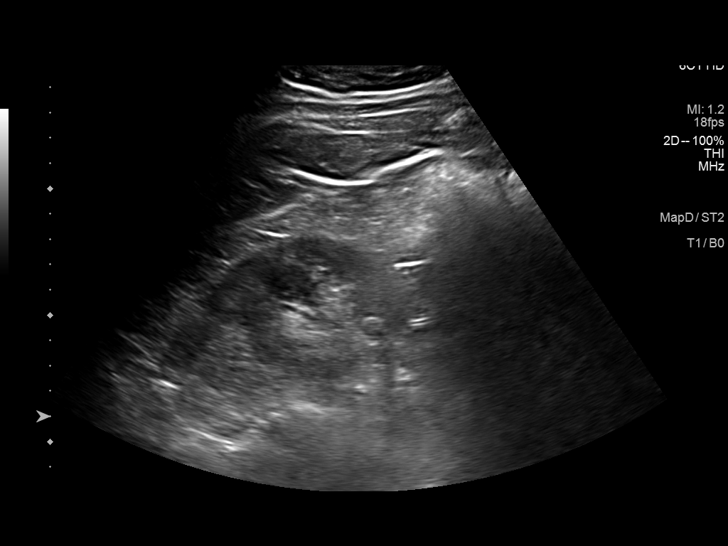
[im 19/50]
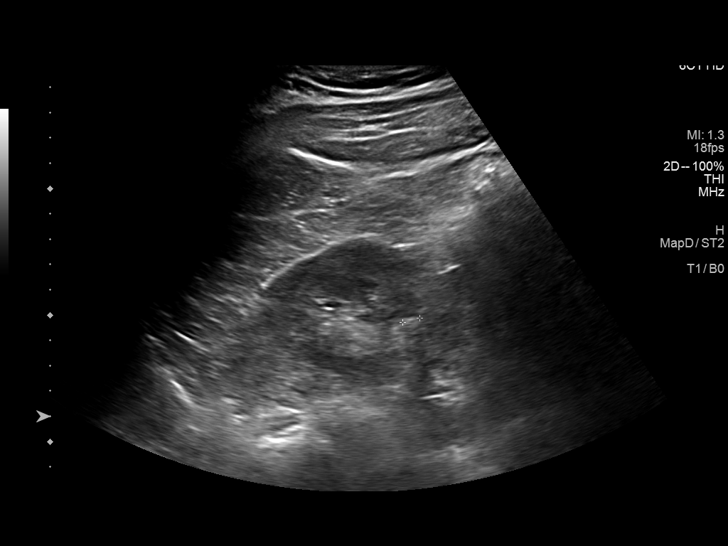
[im 22/50]
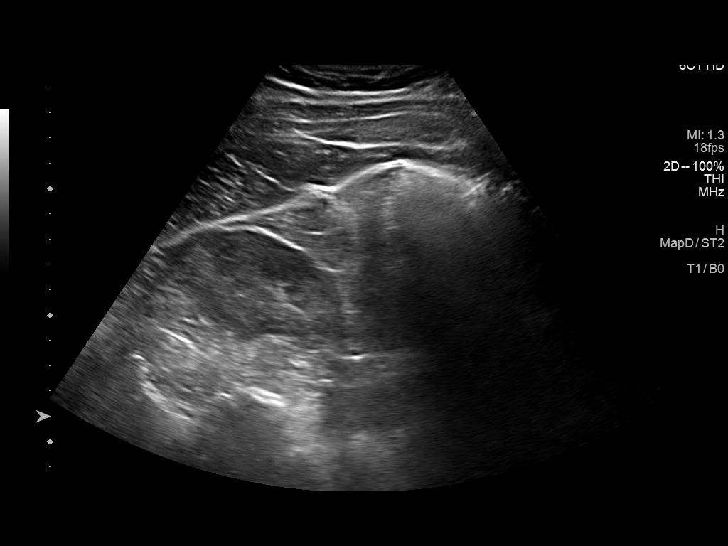
[im 26/50]
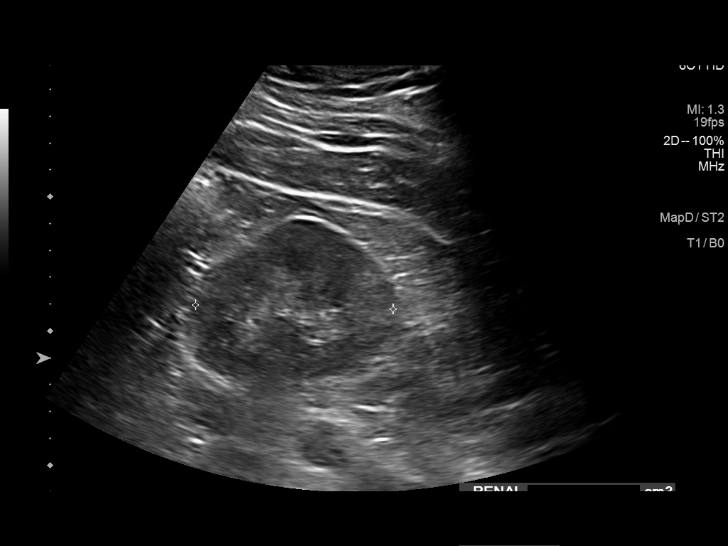
[im 31/50]
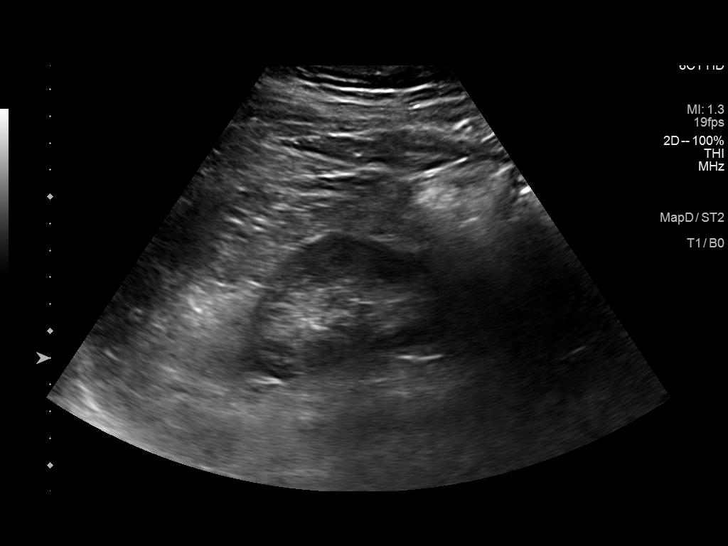
[im 36/50]
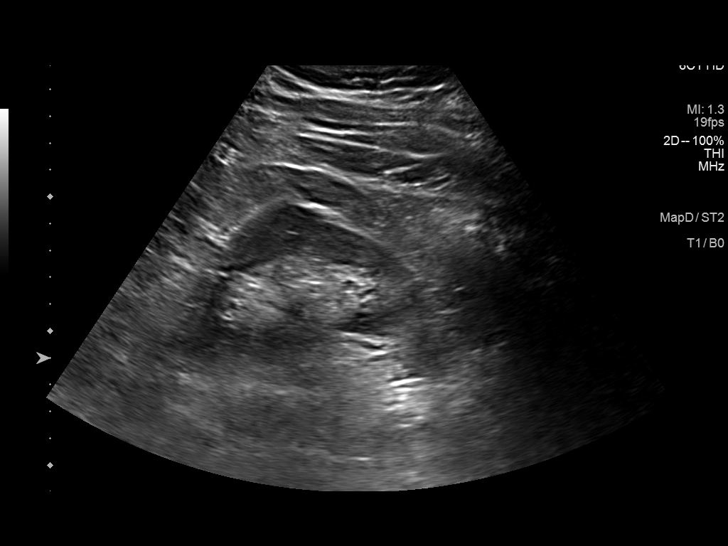
[im 38/50]
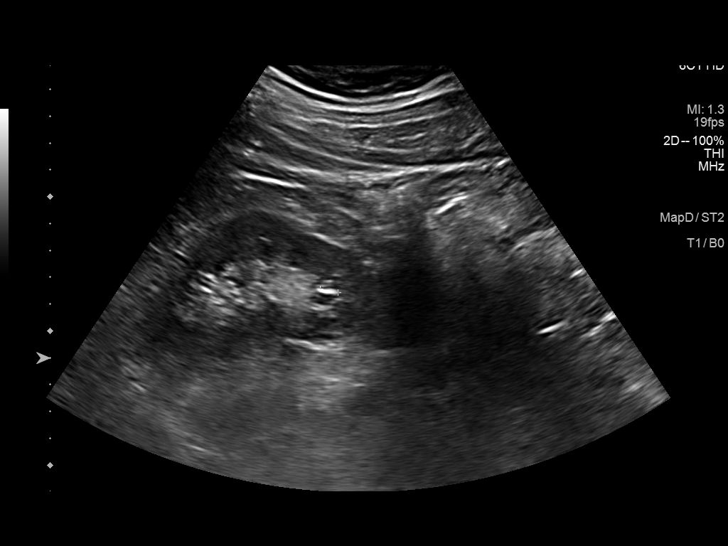
[im 43/50]
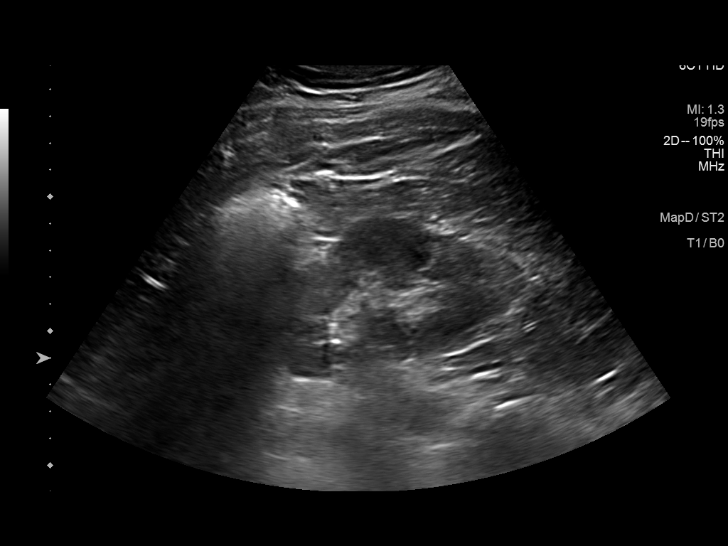
[im 47/50]
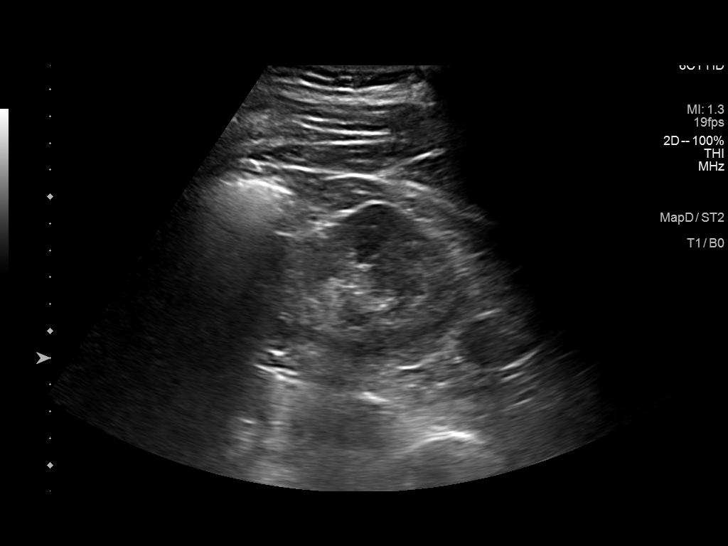

[Series 2001: us renal · 0.19mm/px · 2 of 7 slices shown (2 of 2)]
[im 1/7]
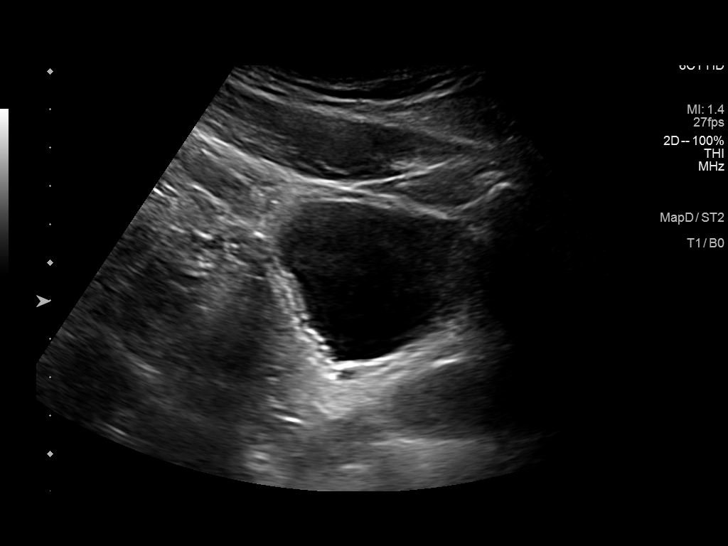
[im 7/7]
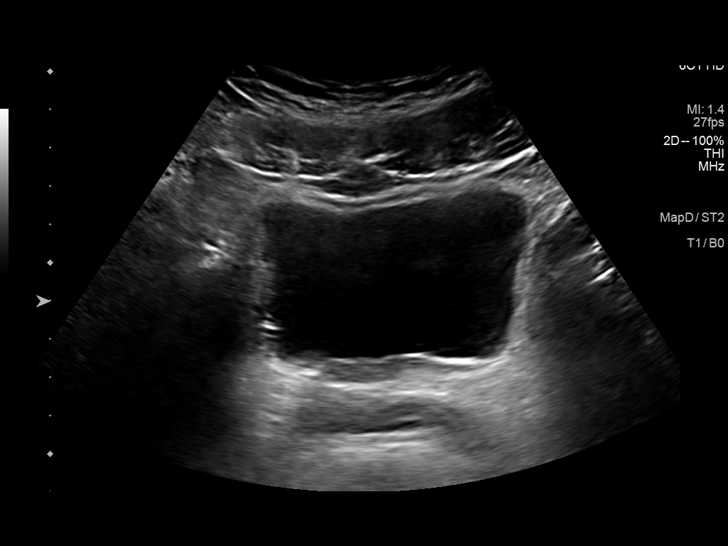

[14 of 25 positions shown; findings below may reference images not displayed]

FINDINGS: Right Kidney:

Renal measurements: 10.6 x 7.0 x 7.6 cm. = volume: 294 mL. Echogenic
focus is noted in the region of the renal pelvis suggestive of
nonobstructing stone.

Left Kidney:

Renal measurements: 10.1 x 5.5 x 7.4 cm. = volume: 213 mL.
Echogenicity is within normal limits. No hydronephrosis is noted. 7
mm calcification is noted in the lower pole consistent with
nonobstructing stone.

Bladder:

Appears normal for degree of bladder distention.

Other:

None.
IMPRESSION: No acute obstructive changes are noted. Bilateral nonobstructing
stones are noted.

## 2022-04-02 ENCOUNTER — Other Ambulatory Visit: Payer: BC Managed Care – PPO

## 2022-04-02 DIAGNOSIS — E785 Hyperlipidemia, unspecified: Secondary | ICD-10-CM

## 2022-04-02 LAB — LIPID PANEL
Chol/HDL Ratio: 4.2 ratio (ref 0.0–5.0)
Cholesterol, Total: 100 mg/dL (ref 100–199)
HDL: 24 mg/dL — ABNORMAL LOW (ref >39)
LDL Chol Calc (NIH): 54 mg/dL (ref 0–99)
Triglycerides: 117 mg/dL (ref 0–149)
VLDL Cholesterol Cal: 22 mg/dL (ref 5–40)

## 2022-04-02 LAB — COMPREHENSIVE METABOLIC PANEL WITH GFR
ALT: 55 IU/L — ABNORMAL HIGH (ref 0–44)
AST: 52 IU/L — ABNORMAL HIGH (ref 0–40)
Albumin/Globulin Ratio: 1.7 (ref 1.2–2.2)
Albumin: 4.2 g/dL (ref 3.8–4.9)
Alkaline Phosphatase: 86 IU/L (ref 44–121)
BUN/Creatinine Ratio: 11 (ref 9–20)
BUN: 17 mg/dL (ref 6–24)
Bilirubin Total: 0.4 mg/dL (ref 0.0–1.2)
CO2: 25 mmol/L (ref 20–29)
Calcium: 9.5 mg/dL (ref 8.7–10.2)
Chloride: 102 mmol/L (ref 96–106)
Creatinine, Ser: 1.61 mg/dL — ABNORMAL HIGH (ref 0.76–1.27)
Globulin, Total: 2.5 g/dL (ref 1.5–4.5)
Glucose: 106 mg/dL — ABNORMAL HIGH (ref 70–99)
Potassium: 4.4 mmol/L (ref 3.5–5.2)
Sodium: 138 mmol/L (ref 134–144)
Total Protein: 6.7 g/dL (ref 6.0–8.5)
eGFR: 51 mL/min/1.73 — ABNORMAL LOW

## 2022-04-15 DIAGNOSIS — L239 Allergic contact dermatitis, unspecified cause: Secondary | ICD-10-CM | POA: Diagnosis not present

## 2022-05-09 DIAGNOSIS — H5213 Myopia, bilateral: Secondary | ICD-10-CM | POA: Diagnosis not present

## 2022-05-09 DIAGNOSIS — E119 Type 2 diabetes mellitus without complications: Secondary | ICD-10-CM | POA: Diagnosis not present

## 2022-05-09 DIAGNOSIS — H2513 Age-related nuclear cataract, bilateral: Secondary | ICD-10-CM | POA: Diagnosis not present

## 2022-05-09 DIAGNOSIS — H524 Presbyopia: Secondary | ICD-10-CM | POA: Diagnosis not present

## 2022-05-09 DIAGNOSIS — H04123 Dry eye syndrome of bilateral lacrimal glands: Secondary | ICD-10-CM | POA: Diagnosis not present

## 2022-05-09 DIAGNOSIS — H52223 Regular astigmatism, bilateral: Secondary | ICD-10-CM | POA: Diagnosis not present

## 2022-05-10 DIAGNOSIS — Z3009 Encounter for other general counseling and advice on contraception: Secondary | ICD-10-CM | POA: Diagnosis not present

## 2022-05-29 ENCOUNTER — Ambulatory Visit: Payer: BC Managed Care – PPO | Admitting: Family Medicine

## 2022-06-12 ENCOUNTER — Other Ambulatory Visit: Payer: Self-pay | Admitting: Family Medicine

## 2022-06-12 DIAGNOSIS — E782 Mixed hyperlipidemia: Secondary | ICD-10-CM

## 2022-06-15 ENCOUNTER — Encounter: Payer: BC Managed Care – PPO | Admitting: Family Medicine

## 2022-06-19 ENCOUNTER — Ambulatory Visit (INDEPENDENT_AMBULATORY_CARE_PROVIDER_SITE_OTHER): Payer: BC Managed Care – PPO | Admitting: Family Medicine

## 2022-06-19 ENCOUNTER — Encounter: Payer: Self-pay | Admitting: Family Medicine

## 2022-06-19 VITALS — BP 110/80 | HR 58 | Temp 97.9°F | Resp 16 | Ht 71.0 in | Wt 228.0 lb

## 2022-06-19 DIAGNOSIS — E1165 Type 2 diabetes mellitus with hyperglycemia: Secondary | ICD-10-CM | POA: Diagnosis not present

## 2022-06-19 DIAGNOSIS — N189 Chronic kidney disease, unspecified: Secondary | ICD-10-CM

## 2022-06-19 DIAGNOSIS — I10 Essential (primary) hypertension: Secondary | ICD-10-CM

## 2022-06-19 DIAGNOSIS — Z0000 Encounter for general adult medical examination without abnormal findings: Secondary | ICD-10-CM | POA: Diagnosis not present

## 2022-06-19 DIAGNOSIS — E785 Hyperlipidemia, unspecified: Secondary | ICD-10-CM | POA: Diagnosis not present

## 2022-06-19 DIAGNOSIS — I25110 Atherosclerotic heart disease of native coronary artery with unstable angina pectoris: Secondary | ICD-10-CM

## 2022-06-19 DIAGNOSIS — Z23 Encounter for immunization: Secondary | ICD-10-CM

## 2022-06-19 MED ORDER — TRULICITY 0.75 MG/0.5ML ~~LOC~~ SOAJ
0.7500 mg | SUBCUTANEOUS | 1 refills | Status: DC
Start: 2022-06-19 — End: 2022-08-28

## 2022-06-19 NOTE — Assessment & Plan Note (Signed)
Well controlled, no changes to meds. Encouraged heart healthy diet such as the DASH diet and exercise as tolerated.  °

## 2022-06-19 NOTE — Progress Notes (Signed)
Subjective:   By signing my name below, I, Chad Chase, attest that this documentation has been prepared under the direction and in the presence of Chad Chase, 06/19/2022.   Patient ID: Chad Chase, male    DOB: 10/03/1970, 51 y.o.   MRN: [id number]  Chief Complaint  Patient presents with   Annual Exam    Pt states fasting     HPI Patient is in today for a comprehensive physical exam.  He denies new moles, itching, chills, fever, hearing loss, sinus pain, congestion, sore throat, cough and hemoptysis, chest pain, palpitations, wheezing, constipation, diarrhea, blood in stool, nausea and vomiting, dysuria, frequency, hematuria, myalgias and joint pain, depression, anxiety.  Blood sugar Patient does not regularly check blood sugar levels at home.  Cardiovascular Patient reports that he regularly sees his cardiologist, Dr. Angelena Form.   Weight Patient reports that he has lost weight since the last visit. Wt Readings from Last 3 Encounters:  06/19/22 228 lb (103.4 kg)  09/26/21 242 lb (109.8 kg)  06/09/21 244 lb (110.7 kg)   Social history- No new surgical procedures. PSA last completed on 06/09/2021.  Immunizations- He is receiving a Tetanus vaccine this visit. Vision- He is UTD on vision care. Dental - He is UTD on dental care.   Health Maintenance Due  Topic Date Due   OPHTHALMOLOGY EXAM  Never done   COLONOSCOPY (Pts 45-70yr Insurance coverage will need to be confirmed)  Never done   FOOT EXAM  11/22/2021   HEMOGLOBIN A1C  12/08/2021   Diabetic kidney evaluation - Urine ACR  06/09/2022    Past Medical History:  Diagnosis Date   CAD (coronary artery disease)    Hypertension    10 years ago--treated with medication    Past Surgical History:  Procedure Laterality Date   CORONARY STENT INTERVENTION N/A 07/23/2017   Procedure: CORONARY STENT INTERVENTION;  Surgeon: MBurnell Blanks MD;  [facility];  Service:  Cardiovascular;  Laterality: N/A;   CORONARY/GRAFT ACUTE MI REVASCULARIZATION N/A 07/21/2017   Procedure: Coronary/Graft Acute MI Revascularization;  Surgeon: MBurnell Blanks MD;  [facility];  Service: Cardiovascular;  Laterality: N/A;   KNEE ARTHROSCOPY     LEFT HEART CATH AND CORONARY ANGIOGRAPHY N/A 07/21/2017   Procedure: LEFT HEART CATH AND CORONARY ANGIOGRAPHY;  Surgeon: MBurnell Blanks MD;  [facility];  Service: Cardiovascular;  Laterality: N/A;   LEFT HEART CATH AND CORONARY ANGIOGRAPHY N/A 07/23/2017   Procedure: LEFT HEART CATH AND CORONARY ANGIOGRAPHY;  Surgeon: MBurnell Blanks MD;  [facility];  Service: Cardiovascular;  Laterality: N/A;    Family History  Problem Relation Age of Onset   Hypertension Mother    Prostate cancer Father    Hypertension Father    Hypertension Maternal Grandmother    Hypertension Maternal Grandfather    Heart failure Paternal Grandmother     Social History   Socioeconomic History   Marital status: Married    Spouse name: Not on file   Number of children: Not on file   Years of education: Not on file   Highest education level: Not on file  Occupational History   Occupation: self employed    Employer: [employer]  Tobacco Use   Smoking status: Never   Smokeless tobacco: Never  Vaping Use   Vaping Use: Never used  Substance and Sexual Activity   Alcohol use: Not Currently   Drug use:  No   Sexual activity: Yes    Partners: Female  Other Topics Concern   Not on file  Social History Narrative   Exercise--- at home   Social Determinants of Health   Financial Resource Strain: Not on file  Food Insecurity: Not on file  Transportation Needs: Not on file  Physical Activity: Not on file  Stress: Not on file  Social Connections: Not on file  Intimate Partner Violence: Not on file    Outpatient Medications Prior to Visit  Medication Sig Dispense Refill    aspirin 81 MG chewable tablet Chew 1 tablet (81 mg total) by mouth daily.     Bempedoic Acid-Ezetimibe (NEXLIZET) 180-10 MG TABS Take 1 tablet by mouth daily. 90 tablet 3   blood glucose meter kit and supplies KIT Dispense based on patient and insurance preference. Use up to four times daily as directed. (FOR ICD-9 250.00, 250.01). 1 each 0   Blood Glucose Monitoring Suppl ([place]) w/Device KIT Use to check blood sugar once day.  Dx code: E11.9 1 kit 0   carvedilol (COREG) 12.5 MG tablet TAKE 1 TABLET (12.5 MG TOTAL) BY MOUTH 2 (TWO) TIMES DAILY WITH A MEAL. 180 tablet 3   glucose blood (ONETOUCH VERIO) test strip Use to check blood sugar once day.  Dx code: E11.9 100 each 1   nitroGLYCERIN (NITROSTAT) 0.4 MG SL tablet Place 1 tablet (0.4 mg total) under the tongue every 5 (five) minutes as needed. 25 tablet 4   OneTouch Delica Lancets 25K MISC Use to check blood sugar once day.  Dx code: E11.9 100 each 1   rosuvastatin (CRESTOR) 40 MG tablet Take 1 tablet (40 mg total) by mouth daily. 90 tablet 3   metFORMIN (GLUCOPHAGE) 500 MG tablet TAKE 1 TABLET BY MOUTH EVERY DAY WITH BREAKFAST 90 tablet 1   No facility-administered medications prior to visit.    No Known Allergies  Review of Systems  Constitutional:  Negative for chills, fever and malaise/fatigue.  HENT:  Negative for congestion, hearing loss, sinus pain and sore throat.   Eyes:  Negative for blurred vision and discharge.  Respiratory:  Negative for cough, hemoptysis, sputum production, shortness of breath and wheezing.   Cardiovascular:  Negative for chest pain, palpitations and leg swelling.  Gastrointestinal:  Negative for abdominal pain, blood in stool, constipation, diarrhea, heartburn, nausea and vomiting.  Genitourinary:  Negative for dysuria, frequency, hematuria and urgency.  Musculoskeletal:  Negative for back pain, falls, joint pain and myalgias.  Skin:  Negative for itching and rash.       (-) new moles   Neurological:  Negative for dizziness, sensory change, loss of consciousness, weakness and headaches.  Endo/Heme/Allergies:  Negative for environmental allergies. Does not bruise/bleed easily.  Psychiatric/Behavioral:  Negative for depression and suicidal ideas. The patient is not nervous/anxious and does not have insomnia.        Objective:    Physical Exam Vitals and nursing note reviewed.  Constitutional:      General: He is not in acute distress.    Appearance: Normal appearance. He is well-developed. He is not ill-appearing.  HENT:     Head: Normocephalic and atraumatic.     Right Ear: Tympanic membrane, ear canal and external ear normal.     Left Ear: Tympanic membrane, ear canal and external ear normal.  Eyes:     Extraocular Movements: Extraocular movements intact.     Pupils: Pupils are equal, round, and reactive to light.  Cardiovascular:     Rate and Rhythm: Normal rate and regular rhythm.     Heart sounds: Normal heart sounds. No murmur heard.    No gallop.  Pulmonary:     Effort: Pulmonary effort is normal. No respiratory distress.     Breath sounds: Normal breath sounds. No wheezing or rales.  Abdominal:     General: Bowel sounds are normal. There is no distension.     Palpations: Abdomen is soft.     Tenderness: There is no abdominal tenderness. There is no guarding.  Skin:    General: Skin is warm and dry.  Neurological:     Mental Status: He is alert and oriented to person, place, and time.  Psychiatric:        Behavior: Behavior normal.        Thought Content: Thought content normal.        Judgment: Judgment normal.     BP 110/80 (BP Location: Left Arm, Patient Position: Sitting, Cuff Size: Large)   Pulse (!) 58   Temp 97.9 F (36.6 C) (Oral)   Resp 16   Ht '5\' 11"'  (1.803 m)   Wt 228 lb (103.4 kg)   SpO2 97%   BMI 31.80 kg/m  Wt Readings from Last 3 Encounters:  06/19/22 228 lb (103.4 kg)  09/26/21 242 lb (109.8 kg)  06/09/21 244 lb (110.7  kg)       Assessment & Plan:   Problem List Items Addressed This Visit       Unprioritized   Preventative health care - Primary   Relevant Orders   CBC with Differential/Platelet   Comprehensive metabolic panel   Hemoglobin A1c   Microalbumin / creatinine urine ratio   TSH   PSA   Type 2 diabetes mellitus with hyperglycemia, without long-term current use of insulin (HCC)    D/c metformin due to kidney function Start trulicity Recheck 3 months      Relevant Medications   Dulaglutide (TRULICITY) 9.81 XB/1.4NW SOPN   Other Relevant Orders   CBC with Differential/Platelet   Comprehensive metabolic panel   Hemoglobin A1c   Microalbumin / creatinine urine ratio   TSH   PSA   Hypertension    Well controlled, no changes to meds. Encouraged heart healthy diet such as the DASH diet and exercise as tolerated.       Relevant Orders   CBC with Differential/Platelet   Comprehensive metabolic panel   Hemoglobin A1c   Microalbumin / creatinine urine ratio   TSH   PSA   Hyperlipidemia    Encourage heart healthy diet such as MIND or DASH diet, increase exercise, avoid trans fats, simple carbohydrates and processed foods, consider a krill or fish or flaxseed oil cap daily.       Coronary artery disease involving native coronary artery of native heart with unstable angina pectoris [facility])    F/u cardiology Check labs  No new symptoms       Other Visit Diagnoses     Chronic kidney disease, unspecified CKD stage       Need for Tdap vaccination       Relevant Orders   Tdap vaccine greater than or equal to 7yo IM (Completed)      Meds ordered this encounter  Medications   Dulaglutide (TRULICITY) 2.95 AO/1.3YQ SOPN    Sig: Inject 0.75 mg into the skin once a week.    Dispense:  3 mL    Refill:  1  I, Chad Chase, personally preformed the services described in this documentation.  All medical record entries made by the scribe were at my direction and in my  presence.  I have reviewed the chart and discharge instructions (if applicable) and agree that the record reflects my personal performance and is accurate and complete. 06/19/2022.   I,Chad Chase,acting as a Education administrator for [employer], DO.,have documented all relevant documentation on the behalf of Ann Held, DO,as directed by  Ann Held, DO while in the presence of Ann Held, DO.    Ann Held, DO

## 2022-06-19 NOTE — Assessment & Plan Note (Signed)
Encourage heart healthy diet such as MIND or DASH diet, increase exercise, avoid trans fats, simple carbohydrates and processed foods, consider a krill or fish or flaxseed oil cap daily.  °

## 2022-06-19 NOTE — Assessment & Plan Note (Signed)
F/u cardiology Check labs  No new symptoms

## 2022-06-19 NOTE — Assessment & Plan Note (Signed)
D/c metformin due to kidney function Start trulicity Recheck 3 months

## 2022-06-20 LAB — CBC WITH DIFFERENTIAL/PLATELET
Basophils Absolute: 0 10*3/uL (ref 0.0–0.1)
Basophils Relative: 0.3 % (ref 0.0–3.0)
Eosinophils Absolute: 0 10*3/uL (ref 0.0–0.7)
Eosinophils Relative: 0.6 % (ref 0.0–5.0)
HCT: 40.2 % (ref 39.0–52.0)
Hemoglobin: 12.9 g/dL — ABNORMAL LOW (ref 13.0–17.0)
Lymphocytes Relative: 37.5 % (ref 12.0–46.0)
Lymphs Abs: 2.1 10*3/uL (ref 0.7–4.0)
MCHC: 32 g/dL (ref 30.0–36.0)
MCV: 87 fl (ref 78.0–100.0)
Monocytes Absolute: 0.7 10*3/uL (ref 0.1–1.0)
Monocytes Relative: 11.8 % (ref 3.0–12.0)
Neutro Abs: 2.8 10*3/uL (ref 1.4–7.7)
Neutrophils Relative %: 49.8 % (ref 43.0–77.0)
Platelets: 297 10*3/uL (ref 150.0–400.0)
RBC: 4.62 Mil/uL (ref 4.22–5.81)
RDW: 14.1 % (ref 11.5–15.5)
WBC: 5.6 10*3/uL (ref 4.0–10.5)

## 2022-06-20 LAB — COMPREHENSIVE METABOLIC PANEL
ALT: 32 U/L (ref 0–53)
AST: 45 U/L — ABNORMAL HIGH (ref 0–37)
Albumin: 4.3 g/dL (ref 3.5–5.2)
Alkaline Phosphatase: 65 U/L (ref 39–117)
BUN: 19 mg/dL (ref 6–23)
CO2: 29 mEq/L (ref 19–32)
Calcium: 9.8 mg/dL (ref 8.4–10.5)
Chloride: 98 mEq/L (ref 96–112)
Creatinine, Ser: 1.54 mg/dL — ABNORMAL HIGH (ref 0.40–1.50)
GFR: 51.65 mL/min — ABNORMAL LOW (ref >60.00)
Glucose, Bld: 87 mg/dL (ref 70–99)
Potassium: 4.4 mEq/L (ref 3.5–5.1)
Sodium: 135 mEq/L (ref 135–145)
Total Bilirubin: 0.8 mg/dL (ref 0.2–1.2)
Total Protein: 7.3 g/dL (ref 6.0–8.3)

## 2022-06-20 LAB — MICROALBUMIN / CREATININE URINE RATIO
Creatinine,U: 138.3 mg/dL
Microalb Creat Ratio: 0.5 mg/g (ref 0.0–30.0)
Microalb, Ur: <0.7 mg/dL (ref 0.0–1.9)

## 2022-06-20 LAB — TSH: TSH: 1.31 u[IU]/mL (ref 0.35–5.50)

## 2022-06-20 LAB — PSA: PSA: 0.2 ng/mL (ref 0.10–4.00)

## 2022-06-20 LAB — HEMOGLOBIN A1C: Hgb A1c MFr Bld: 7.3 % — ABNORMAL HIGH (ref 4.6–6.5)

## 2022-06-28 ENCOUNTER — Other Ambulatory Visit: Payer: Self-pay | Admitting: Family Medicine

## 2022-06-28 ENCOUNTER — Other Ambulatory Visit: Payer: Self-pay | Admitting: Cardiovascular Disease

## 2022-07-18 ENCOUNTER — Ambulatory Visit: Payer: Self-pay | Admitting: Licensed Clinical Social Worker

## 2022-07-18 NOTE — Patient Outreach (Signed)
  Care Coordination   07/18/2022 Name: Chad Chase MRN: [id number] DOB: [birth year]   Care Coordination Outreach Attempts:  An unsuccessful telephone outreach was attempted today to offer the patient information about available care coordination services as a benefit of their health plan.   Follow Up Plan:  Additional outreach attempts will be made to offer the patient care coordination information and services.   Encounter Outcome:  No Answer  Care Coordination Interventions Activated:  No   Care Coordination Interventions:  No, not indicated    Kelton Pillar.Daysen Gundrum MSW, LCSW Licensed Visual merchandiser [facility] Care Management [phone]

## 2022-08-04 DIAGNOSIS — R051 Acute cough: Secondary | ICD-10-CM | POA: Diagnosis not present

## 2022-08-22 ENCOUNTER — Ambulatory Visit: Payer: Self-pay | Admitting: Licensed Clinical Social Worker

## 2022-08-22 NOTE — Patient Outreach (Signed)
  Care Coordination   Follow Up Visit Note   08/22/2022 Name: DENIM KALMBACH MRN: [id number] DOB: 1971/08/16  RYZEN DEADY is a 51 y.o. year old male who sees Zola Button, Grayling Congress, DO for primary care. I spoke with  Fransisca Connors by phone today.  What matters to the patients health and wellness today?  Client may talk with PCP about support options with Care Coordination program    Goals Addressed             This Visit's Progress    Patient Stated he sees PCP for primary medical support. Client may talk with PCP about support options with Care Coordination program.       Interventions: LCSW spoke with client today about program support. Client received information about program support. Client may talk with PCP about program support. LCSW described to client program support with LCSW, RN and Pharmacist. Client appreciative of phone call today from LCSW     SDOH assessments and interventions completed:  Yes  SDOH Interventions Today    Flowsheet Row Most Recent Value  SDOH Interventions   Stress Interventions Other (Comment)  [client may have some slight stress in managing medical needs]        Care Coordination Interventions:  Yes, provided   Follow up plan: No further intervention required. at this time. Client may be interested in program support in the future  Encounter Outcome:  Pt. Visit Completed   Kelton Pillar.Jamiah Recore MSW, LCSW Licensed Visual merchandiser [facility] Care Management [phone]

## 2022-08-22 NOTE — Patient Instructions (Addendum)
Visit Information  Thank you for taking time to visit with me today. Please don't hesitate to contact me if I can be of assistance to you before our next scheduled telephone appointment.  Following are the goals we discussed today:   No further intervention needed at this time.  Client may talk with PCP about program support  Please call the care guide team at [phone] if you need to cancel or reschedule your appointment.   If you are experiencing a Mental Health or Behavioral Health Crisis or need someone to talk to, please go to [facility] [address] [place] [phone])   Following is a copy of your full plan of care:   Interventions: LCSW spoke with client today about program support. Client received information about program support. Client may talk with PCP about program support. LCSW described to client program support with LCSW, RN and Pharmacist. Client appreciative of phone call today from LCSW  Chad Chase was given information about Care Management services by the embedded care coordination team including:  Care Management services include personalized support from designated clinical staff supervised by his physician, including individualized plan of care and coordination with other care providers 24/7 contact phone numbers for assistance for urgent and routine care needs. The patient may stop CCM services at any time (effective at the end of the month) by phone call to the office staff.  Patient agreed to services and verbal consent obtained.   Chad Chase.Chad Chase MSW, LCSW Licensed Visual merchandiser [facility] Care Management [phone]

## 2022-08-26 ENCOUNTER — Other Ambulatory Visit: Payer: Self-pay | Admitting: Family Medicine

## 2022-08-26 DIAGNOSIS — E1165 Type 2 diabetes mellitus with hyperglycemia: Secondary | ICD-10-CM

## 2022-09-23 ENCOUNTER — Other Ambulatory Visit: Payer: Self-pay | Admitting: Family Medicine

## 2022-09-24 ENCOUNTER — Other Ambulatory Visit: Payer: Self-pay | Admitting: Cardiovascular Disease

## 2022-10-16 ENCOUNTER — Other Ambulatory Visit: Payer: Self-pay | Admitting: Cardiovascular Disease

## 2022-11-07 ENCOUNTER — Telehealth: Payer: Self-pay | Admitting: Cardiovascular Disease

## 2022-11-07 NOTE — Telephone Encounter (Signed)
*  STAT* If patient is at the pharmacy, call can be transferred to refill team.   1. Which medications need to be refilled? (please list name of each medication and dose if known) rosuvastatin (CRESTOR) 40 MG tablet TAKE 1 TABLET BY MOUTH DAILY. PT NEEDS TO SCHEDULE APPT WITH PROVIDER FOR FURTHER REFILLS  2. Which pharmacy/location (including street and city if local pharmacy) is medication to be sent to?  [id number] - [place], [place] - [address]  3. Do they need a 30 day or 90 day supply? 90 Day Supply  Pt has an appt scheduled for 12/26/22, that was the soonest appt we have available

## 2022-11-08 MED ORDER — ROSUVASTATIN CALCIUM 40 MG PO TABS: ORAL_TABLET | ORAL | 0 refills | Status: DC

## 2022-11-08 NOTE — Telephone Encounter (Signed)
Pt's medication was sent to pt's pharmacy as requested. Confirmation received.  °

## 2022-11-10 ENCOUNTER — Other Ambulatory Visit: Payer: Self-pay | Admitting: Family Medicine

## 2022-11-10 DIAGNOSIS — E1165 Type 2 diabetes mellitus with hyperglycemia: Secondary | ICD-10-CM

## 2022-12-26 ENCOUNTER — Encounter: Payer: Self-pay | Admitting: Physician Assistant

## 2022-12-26 ENCOUNTER — Ambulatory Visit: Payer: BC Managed Care – PPO | Attending: Physician Assistant | Admitting: Cardiology

## 2022-12-26 VITALS — BP 148/90 | HR 54 | Ht 72.0 in | Wt 228.2 lb

## 2022-12-26 DIAGNOSIS — I351 Nonrheumatic aortic (valve) insufficiency: Secondary | ICD-10-CM | POA: Diagnosis not present

## 2022-12-26 DIAGNOSIS — I10 Essential (primary) hypertension: Secondary | ICD-10-CM | POA: Diagnosis not present

## 2022-12-26 DIAGNOSIS — E785 Hyperlipidemia, unspecified: Secondary | ICD-10-CM

## 2022-12-26 DIAGNOSIS — I2510 Atherosclerotic heart disease of native coronary artery without angina pectoris: Secondary | ICD-10-CM | POA: Diagnosis not present

## 2022-12-26 MED ORDER — ROSUVASTATIN CALCIUM 40 MG PO TABS: ORAL_TABLET | ORAL | 3 refills | Status: DC

## 2022-12-26 NOTE — Progress Notes (Signed)
Cardiology Office Note:    Date:  12/26/2022   ID:  Chad Chase, DOB 1971/05/15, MRN [id number]  PCP:  Zola Button, Grayling Congress, DO   [facility] Providers Cardiologist:  Verne Carrow, MD {   Referring MD: Zola Button, Grayling Congress, *   Chief Complaint  Patient presents with   Follow-up    CAD    History of Present Illness:    Chad Chase is a 52 y.o. male with a hx of CAD, HTN.  Per chart review, patient was admitted to [facility] with an inferior STEMI. He underwent LCH on 07/21/17 and was found to have an occluded RCA. He received 2 DES to the RCA. On 07/23/17 he underwent staged PCI of the intermediate branch with DES. During staged PCI, he was found to have occlusion of the mid RCA stents, likely due to stent thrombosis from high thrombus burden and poor distal runoff due to embolization. Flow could not be restored down the RCA on repeated PCI. Echocardiogram that admission showed EF 60-65%, moderate LVH, no regional wall motion abnormalities, grade I diastolic dysfunction, mild AI, normal RV systolic function. After his MI, patient did well and completed cardiac rehab.   He was last seen by Dr. Clifton James on 09/26/21. At that time, he was doing well and denied chest pain, dyspnea, palpitations, lower extremity edema. Plavix was stopped at that appointment, and he remained on ASA, statin, and betablocker.   Today, patient reports that he has been doing well since last being seen. He has about 1-2 episodes of chest pain per month. These episodes feel like an ache in his chest, and occur while he is at rest. Pain resolves with either SL nitro or on its own. He denies chest pain or shortness of breath with activity. 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 Denies ankle edema, orthopnea, palpitations, dizziness, syncope, near syncope   Past Medical History:  Diagnosis Date   CAD (coronary  artery disease)    Hypertension    10 years ago--treated with medication    Past Surgical History:  Procedure Laterality Date   CORONARY STENT INTERVENTION N/A 07/23/2017   Procedure: CORONARY STENT INTERVENTION;  Surgeon: Kathleene Hazel, MD;  Location: [facility] INVASIVE CV [facility];  Service: Cardiovascular;  Laterality: N/A;   CORONARY/GRAFT ACUTE MI REVASCULARIZATION N/A 07/21/2017   Procedure: Coronary/Graft Acute MI Revascularization;  Surgeon: Kathleene Hazel, MD;  Location: [facility] INVASIVE CV LAB;  Service: Cardiovascular;  Laterality: N/A;   KNEE ARTHROSCOPY     LEFT HEART CATH AND CORONARY ANGIOGRAPHY N/A 07/21/2017   Procedure: LEFT HEART CATH AND CORONARY ANGIOGRAPHY;  Surgeon: Kathleene Hazel, MD;  Location: [facility] INVASIVE CV LAB;  Service: Cardiovascular;  Laterality: N/A;   LEFT HEART CATH AND CORONARY ANGIOGRAPHY N/A 07/23/2017   Procedure: LEFT HEART CATH AND CORONARY ANGIOGRAPHY;  Surgeon: Kathleene Hazel, MD;  Location: [facility] INVASIVE CV [facility];  Service: Cardiovascular;  Laterality: N/A;    Current Medications: Current Meds  Medication Sig   aspirin 81 MG chewable tablet Chew 1 tablet (81 mg total) by mouth daily.   Bempedoic Acid-Ezetimibe (NEXLIZET) 180-10 MG TABS Take 1 tablet by mouth daily.   blood glucose meter kit and supplies KIT Dispense based on patient and insurance preference. Use up to four times daily as directed. (FOR ICD-9 250.00, 250.01).   Blood Glucose Monitoring Suppl (ONETOUCH VERIO  FLEX SYSTEM) w/Device KIT Use to check blood sugar once day.  Dx code: E11.9   carvedilol (COREG) 12.5 MG tablet TAKE 1 TABLET (12.5MG  TOTAL) BY MOUTH TWICE A DAY WITH MEALS   Dulaglutide (TRULICITY) 0.75 MG/0.5ML SOPN INJECT 0.75 MG SUBCUTANEOUSLY ONE TIME PER WEEK   glucose blood (ONETOUCH VERIO) test strip Use to check blood sugar once day.  Dx code: E11.9   nitroGLYCERIN (NITROSTAT) 0.4 MG SL tablet Place 1 tablet (0.4 mg total) under the tongue every 5  (five) minutes as needed.   OneTouch Delica Lancets 33G MISC Use to check blood sugar once day.  Dx code: E11.9   [DISCONTINUED] rosuvastatin (CRESTOR) 40 MG tablet TAKE 1 TABLET BY MOUTH DAILY.     Allergies:   Patient has no known allergies.   Social History   Socioeconomic History   Marital status: Married    Spouse name: Not on file   Number of children: Not on file   Years of education: Not on file   Highest education level: Not on file  Occupational History   Occupation: self employed    Employer: [employer]  Tobacco Use   Smoking status: Never   Smokeless tobacco: Never  Vaping Use   Vaping Use: Never used  Substance and Sexual Activity   Alcohol use: Not Currently   Drug use: No   Sexual activity: Yes    Partners: Female  Other Topics Concern   Not on file  Social History Narrative   Exercise--- at home   Social Determinants of Health   Financial Resource Strain: Not on file  Food Insecurity: Not on file  Transportation Needs: Not on file  Physical Activity: Not on file  Stress: No Stress Concern Present (08/22/2022)   [employer] of Occupational Health - Occupational Stress Questionnaire    Feeling of Stress : Only a little  Social Connections: Not on file     Family History: The patient's family history includes Heart failure in his paternal grandmother; Hypertension in his father, maternal grandfather, maternal grandmother, and mother; Prostate cancer in his father.  ROS:   Please see the history of present illness.     All other systems reviewed and are negative.  EKGs/Labs/Other Studies Reviewed:    The following studies were reviewed today: Cardiac Studies & Procedures   CARDIAC CATHETERIZATION  CARDIAC CATHETERIZATION 07/23/2017  Narrative  Mid RCA to Dist RCA lesion is 100% stenosed.  Ramus lesion is 90% stenosed.  Prox Cx to Mid Cx lesion is 30% stenosed.  Ost 1st Diag lesion is 50% stenosed.  A drug-eluting stent was  successfully placed using a STENT SIERRA 2.75 X 18 MM.  Post intervention, there is a 0% residual stenosis.  1. Severe stenosis ramus intermediate branch. Successful PTCA/DES x 1 intermediate branch 2. Occlusion of the mid RCA stents. Unable to open the occluded RCA stented segment.  Recommendations: Continue DAPT with ASA and Brilinta for one year. Continue statin/beta blocker.  Findings Coronary Findings Diagnostic  Dominance: Co-dominant  Left Anterior Descending  First Diagonal Branch Ost 1st Diag lesion is 50% stenosed.  Ramus Intermedius Vessel is large. Ramus lesion is 90% stenosed.  Left Circumflex Prox Cx to Mid Cx lesion is 30% stenosed.  Right Coronary Artery Mid RCA to Dist RCA lesion is 100% stenosed. The lesion was previously treated using a drug eluting stent less than 48 hours ago.  Intervention  Ramus lesion Stent Pre-stent angioplasty was performed using a BALLOON SAPPHIRE 2.5X12. A drug-eluting  stent was successfully placed using a STENT SIERRA 2.75 X 18 MM. Stent strut is well apposed. Post-stent angioplasty was performed using a BALLOON SAPPHIRE [place] [id number]. Post-Intervention Lesion Assessment The intervention was successful. Pre-interventional TIMI flow is 3. Post-intervention TIMI flow is 3. No complications occurred at this lesion. There is a 0% residual stenosis post intervention.   CARDIAC CATHETERIZATION 07/21/2017  Narrative  Mid RCA lesion is 100% stenosed.  Acute Mrg lesion is 50% stenosed.  A drug-eluting stent was successfully placed using a STENT SYNERGY DES 3X24.  Post intervention, there is a 0% residual stenosis.  Dist RCA lesion is 80% stenosed.  A drug-eluting stent was successfully placed using a STENT SYNERGY DES 2.75X24.  Post intervention, there is a 0% residual stenosis.  Prox Cx to Mid Cx lesion is 30% stenosed.  Ramus lesion is 90% stenosed.  Ost Ramus lesion is 30% stenosed.  Ost 1st Diag lesion is 50%  stenosed.  Prox LAD to Mid LAD lesion is 20% stenosed.  The left ventricular systolic function is normal.  LV end diastolic pressure is normal.  The left ventricular ejection fraction is greater than 65% by visual estimate.  There is no mitral valve regurgitation.  1. Acute inferior STEMI secondary to occluded, aneurysmal mid RCA. 2. Successful PTCA/DES x 2 mid RCA with evidence of residual thrombus in the small caliber distal branches. 3. Mild non-obstructive disease in the LAD. Moderate ostial stenosis in the moderate caliber first Diagonal branch. 4. Mild non-obstructive disease in the Circumflex artery. 5. Severe stenosis in the large caliber intermediate branch. 6. Normal LV systolic function  Recommendations: Will admit to ICU and continue Aggrastat drip for 18 hours given the large thrombus burden. Continue ASA/Brilinta/high intensity statin. No beta blocker given bradycardia. Echo in am. Will plan re-look cath late tomorrow afternoon or on Tuesday and at that time will likely perform PCI on the intermediate branch and assess need for any further intervention on the RCA.  Findings Coronary Findings Diagnostic  Dominance: Co-dominant  Left Anterior Descending Prox LAD to Mid LAD lesion is 20% stenosed.  First Diagonal Branch Vessel is moderate in size. Ost 1st Diag lesion is 50% stenosed.  Ramus Intermedius Vessel is large. Ost Ramus lesion is 30% stenosed. Ramus lesion is 90% stenosed.  Left Circumflex Prox Cx to Mid Cx lesion is 30% stenosed. The Circumflex has several aneurysmal segments  Right Coronary Artery Vessel is large. Mid RCA lesion is 100% stenosed. The lesion is heavily thrombotic. Dist RCA lesion is 80% stenosed.  Acute Marginal Branch Acute Mrg lesion is 50% stenosed.  Intervention  Mid RCA lesion Stent Pre-stent angioplasty was performed using a BALLOON SAPPHIRE 2.5X15. A drug-eluting stent was successfully placed using a STENT SYNERGY DES  3X24. Stent strut is well apposed. Post-stent angioplasty was performed using a BALLOON SAPPHIRE [place] 4.0X18. Post-Intervention Lesion Assessment There is a 0% residual stenosis post intervention.  Dist RCA lesion Stent Pre-stent angioplasty was performed using a BALLOON SAPPHIRE 2.0X20. A drug-eluting stent was successfully placed using a STENT SYNERGY DES 2.75X24. Stent overlaps previously placed stent. Post-stent angioplasty was performed using a BALLOON SAPPHIRE [place] 3.0X18. Post-Intervention Lesion Assessment The intervention was successful. Pre-interventional TIMI flow is 0. Post-intervention TIMI flow is 2. No complications occurred at this lesion. There is a 0% residual stenosis post intervention.     ECHOCARDIOGRAM  ECHOCARDIOGRAM COMPLETE 07/22/2017  Narrative *[place]* *[facility]* [address] [place], [place] [address] [phone]  ------------------------------------------------------------------- Transthoracic Echocardiography  (  Report amended )  Patient:    Tysen, Roesler MR #:       [id number] Study Date: 07/22/2017 Gender:     M Age:        89 Height:     180.3 cm Weight:     98 kg BSA:        2.24 m^2 Pt. Status: Room:       2H24C  ATTENDING    Gwyneth Sprout [id number] ADMITTING    Clifton James, Audie Box, Christopher REFERRING    Verne Carrow PERFORMING   [facility], Inpatient SONOGRAPHER  Sinda Du, RDCS  cc:  ------------------------------------------------------------------- LV EF: 60% -   65%  ------------------------------------------------------------------- Indications:      CAD of native vessels 414.01.  ------------------------------------------------------------------- History:   PMH:   Myocardial infarction.  Risk factors: Hypertension.  ------------------------------------------------------------------- Study Conclusions  - Left ventricle: The cavity size was normal. There was  moderate concentric hypertrophy. Systolic function was normal. The estimated ejection fraction was in the range of 60% to 65%. Wall motion was normal; there were no regional wall motion abnormalities. Doppler parameters are consistent with abnormal left ventricular relaxation (grade 1 diastolic dysfunction). - Aortic valve: Transvalvular velocity was within the normal range. There was no stenosis. There was mild regurgitation. Valve area (Vmax): 1.94 cm^2. - Mitral valve: Transvalvular velocity was within the normal range. There was no evidence for stenosis. There was no regurgitation. Valve area by pressure half-time: 1.71 cm^2. - Right ventricle: The cavity size was normal. Wall thickness was normal. Systolic function was normal. - Atrial septum: No defect or patent foramen ovale was identified. - Tricuspid valve: There was no regurgitation. - Pericardium, extracardiac: There was a left pleural effusion.  ------------------------------------------------------------------- Study data:  No prior study was available for comparison.  Study status:  Routine.  Procedure:  Transthoracic echocardiography. Image quality was adequate.  Study completion:  There were no complications.          Transthoracic echocardiography.  M-mode, complete 2D, spectral Doppler, and color Doppler.  Birthdate: Patient birthdate: [birth year].  Age:  Patient is 52 yr old.  Sex: Gender: male.    BMI: 30.1 kg/m^2.  Blood pressure:     160/112 Patient status:  Inpatient.  Study date:  Study date: 07/22/2017. Study time: 10:15 AM.  Location:  Bedside.  -------------------------------------------------------------------  ------------------------------------------------------------------- Left ventricle:  The cavity size was normal. There was moderate concentric hypertrophy. Systolic function was normal. The estimated ejection fraction was in the range of 60% to 65%. Wall motion was normal; there were no regional  wall motion abnormalities. Doppler parameters are consistent with abnormal left ventricular relaxation (grade 1 diastolic dysfunction).  ------------------------------------------------------------------- Aortic valve:   Trileaflet; normal thickness leaflets. Mobility was not restricted.  Doppler:  Transvalvular velocity was within the normal range. There was no stenosis. There was mild regurgitation. Peak velocity ratio of LVOT to aortic valve: 0.68. Valve area (Vmax): 1.94 cm^2. Indexed valve area (Vmax): 0.87 cm^2/m^2. Peak gradient (S): 12 mm Hg.  ------------------------------------------------------------------- Aorta:  Aortic root: The aortic root was normal in size.  ------------------------------------------------------------------- Mitral valve:   Structurally normal valve.   Mobility was not restricted.  Doppler:  Transvalvular velocity was within the normal range. There was no evidence for stenosis. There was no regurgitation.    Valve area by pressure half-time: 1.71 cm^2. Indexed valve area by pressure half-time: 0.76 cm^2/m^2.  ------------------------------------------------------------------- Left atrium:  The atrium was normal in size.  ------------------------------------------------------------------- Atrial septum:  No defect or patent foramen ovale was identified.  ------------------------------------------------------------------- Right ventricle:  The cavity size was normal. Wall thickness was normal. Systolic function was normal.  ------------------------------------------------------------------- Pulmonic valve:    Structurally normal valve.   Cusp separation was normal.  Doppler:  Transvalvular velocity was within the normal range. There was no evidence for stenosis. There was no regurgitation.  ------------------------------------------------------------------- Tricuspid valve:   Structurally normal valve.    Doppler: Transvalvular velocity was  within the normal range. There was no regurgitation.  ------------------------------------------------------------------- Pulmonary artery:   The main pulmonary artery was normal-sized. Systolic pressure was within the normal range.  ------------------------------------------------------------------- Right atrium:  The atrium was normal in size.  ------------------------------------------------------------------- Pericardium:  There was no pericardial effusion.  ------------------------------------------------------------------- Systemic veins: Inferior vena cava: The vessel was normal in size. The respirophasic diameter changes were in the normal range (>= 50%), consistent with normal central venous pressure.  ------------------------------------------------------------------- Pleura:  There was a left pleural effusion.  ------------------------------------------------------------------- Measurements  Left ventricle                           Value          Reference LV ID, ED, PLAX chordal                  44.7  mm       43 - 52 LV ID, ES, PLAX chordal          (L)     21.6  mm       23 - 38 LV fx shortening, PLAX chordal           52    %        >=29 LV PW thickness, ED                      15.2  mm       ---------- IVS/LV PW ratio, ED                      1.06           <=1.3 Stroke volume, 2D                        68    ml       ---------- Stroke volume/bsa, 2D                    30    ml/m^2   ---------- LV e&', lateral                           9.03  cm/s     ---------- LV E/e&', lateral                         0.94           ----------  Ventricular septum                       Value          Reference IVS thickness, ED                        16.1  mm       ----------  LVOT  Value          Reference LVOT ID, S                               19    mm       ---------- LVOT area                                2.84  cm^2      ---------- LVOT peak velocity, S                    117   cm/s     ---------- LVOT mean velocity, S                    88.1  cm/s     ---------- LVOT VTI, S                              24    cm       ---------- LVOT peak gradient, S                    5     mm Hg    ----------  Aortic valve                             Value          Reference Aortic valve peak velocity, S            171   cm/s     ---------- Aortic peak gradient, S                  12    mm Hg    ---------- Velocity ratio, peak, LVOT/AV            0.68           ---------- Aortic valve area, peak velocity         1.94  cm^2     ---------- Aortic valve area/bsa, peak              0.87  cm^2/m^2 ---------- velocity Aortic regurg pressure half-time         412   ms       ----------  Aorta                                    Value          Reference Aortic root ID, ED                       30    mm       ----------  Left atrium                              Value          Reference LA ID, A-P, ES                           37    mm       ----------  LA ID/bsa, A-P                           1.65  cm/m^2   <=2.2 LA volume, ES, 1-p A4C                   50.7  ml       ---------- LA volume/bsa, ES, 1-p A4C               22.6  ml/m^2   ----------  Mitral valve                             Value          Reference Mitral E-wave peak velocity              8.49  cm/s     ---------- Mitral A-wave peak velocity              90.8  cm/s     ---------- Mitral deceleration time         (H)     268   ms       150 - 230 Mitral pressure half-time                78    ms       ---------- Mitral E/A ratio, peak                   0.6            ---------- Mitral valve area, PHT, DP               1.71  cm^2     ---------- Mitral valve area/bsa, PHT, DP           0.76  cm^2/m^2 ----------  Right atrium                             Value          Reference RA ID, S-I, ES, A4C              (H)     55.3  mm       34 - 49 RA area, ES, A4C                          17.1  cm^2     8.3 - 19.5 RA volume, ES, A/L                       43    ml       ---------- RA volume/bsa, ES, A/L                   19.2  ml/m^2   ----------  Systemic veins                           Value          Reference Estimated CVP                            3     mm Hg    ----------  Right ventricle  Value          Reference RV ID, minor axis, ED, A4C base          35.6  mm       ---------- TAPSE                                    25.6  mm       ---------- RV s&', lateral, S                        13.2  cm/s     ----------  Legend: (L)  and  (H)  mark values outside specified reference range.  ------------------------------------------------------------------- Hart Carwin, MD 2018-11-26T11:37:16              EKG:  EKG is ordered today.  The ekg ordered today demonstrates sinus bradycardia with HR 54 BPM.   Recent Labs: 06/19/2022: ALT 32; BUN 19; Creatinine, Ser 1.54; Hemoglobin 12.9; Platelets 297.0; Potassium 4.4; Sodium 135; TSH 1.31  Recent Lipid Panel    Component Value Date/Time   CHOL 100 04/02/2022 1018   TRIG 117 04/02/2022 1018   HDL 24 (L) 04/02/2022 1018   CHOLHDL 4.2 04/02/2022 1018   CHOLHDL 5 06/09/2021 1054   VLDL 17.0 06/09/2021 1054   LDLCALC 54 04/02/2022 1018   LDLDIRECT 93.0 03/31/2018 0904     Risk Assessment/Calculations:      HYPERTENSION CONTROL Vitals:   12/26/22 1536 12/26/22 1621  BP: (!) 141/98 (!) 148/90    The patient's blood pressure is elevated above target today.  In order to address the patient's elevated BP: Blood pressure will be monitored at home to determine if medication changes need to be made.            Physical Exam:    VS:  BP (!) 148/90   Pulse (!) 54   Ht 6' (1.829 m)   Wt 228 lb 3.2 oz (103.5 kg)   SpO2 98%   BMI 30.95 kg/m     Wt Readings from Last 3 Encounters:  12/26/22 228 lb 3.2 oz (103.5 kg)  06/19/22 228 lb (103.4 kg)  09/26/21 242  lb (109.8 kg)     GEN:  Well nourished, well developed in no acute distress. Sitting comfortably on the exam table  HEENT: Normal NECK: No JVD CARDIAC: RRR, no murmurs, rubs, gallops. Radial pulses 2+ bilaterally  RESPIRATORY:  Clear to auscultation without rales, wheezing or rhonchi. Normal WOB on room air   ABDOMEN: Soft, non-tender, non-distended MUSCULOSKELETAL:  No edema in BLE; No deformity  SKIN: Warm and dry NEUROLOGIC:  Alert and oriented x 3 PSYCHIATRIC:  Normal affect   ASSESSMENT:    1. Coronary artery disease involving native coronary artery of native heart without angina pectoris   2. Essential hypertension    PLAN:    In order of problems listed above:  CAD - Patient had an inferior STEMI in 2018. Treated with DESx2 to the mid RCA. A few days later, he had staged PCI/DES to the intermediate branch, and was found to have occlusion of the RCA stents. Flow could not be restored to the RCA on repeated PCI  - Patient reports that he has about 1-2 episodes of chest pain per month that occur while he is at rest. Feels like an ache, resolves with SL nitro or spontaneously. He does  not have chest pain/pressure on exertion. He believes that chest pain is related to stress.  - Offered nuclear stress test, but patient would like to hold off for now. This is reasonable as his chest pain is somewhat atypical (he does not have chest pain on exertion, pain only occurs at rest)  - EKG in office today was without ischemic changes  - Patient has not been taking ASA. Instructed him to resume taking ASA 81 mg daily  - Continue carvedilol 12.5 mg BID, and crestor 40 mg daily   HTN  - BP is elevated in clinic today  - Discussed adding amlodipine 5 mg daily to his medication regiment. Patient would prefer to keep a blood pressure log for the next 2 weeks before adding additional medications.  - Provided patient with BP log, he will return log to our office via mychart in 2 weeks  -  Discussed low sodium diets   HLD  - Lipid panel from 03/2022 showed LDL 54, HDL 24, triglycerides 117, total cholesterol 100  - Continue crestor 40 mg daily   Mild AI - Noted on echocardiogram from 2018 - No murmur on exam. No need for intervention at this time. Continue to monitor clinically    Medication Adjustments/Labs and Tests Ordered: Current medicines are reviewed at length with the patient today.  Concerns regarding medicines are outlined above.  Orders Placed This Encounter  Procedures   EKG 12-Lead   Meds ordered this encounter  Medications   rosuvastatin (CRESTOR) 40 MG tablet    Sig: TAKE 1 TABLET BY MOUTH DAILY.    Dispense:  90 tablet    Refill:  3    Patient Instructions  Medication Instructions:  Your physician has recommended you make the following change in your medication:   RESTART Aspirin 81 mg taking 1 daily  *If you need a refill on your cardiac medications before your next appointment, please call your pharmacy*   Lab Work: None ordered  If you have labs (blood work) drawn today and your tests are completely normal, you will receive your results only by: MyChart Message (if you have MyChart) OR A paper copy in the mail If you have any lab test that is abnormal or we need to change your treatment, we will call you to review the results.   Testing/Procedures: None ordered   Follow-Up: At [facility], you and your health needs are our priority.  As part of our continuing mission to provide you with exceptional heart care, we have created designated Provider Care Teams.  These Care Teams include your primary Cardiologist (physician) and Advanced Practice Providers (APPs -  Physician Assistants and Nurse Practitioners) who all work together to provide you with the care you need, when you need it.  We recommend signing up for the patient portal called "MyChart".  Sign up information is provided on this After Visit Summary.  MyChart is used  to connect with patients for Virtual Visits (Telemedicine).  Patients are able to view lab/test results, encounter notes, upcoming appointments, etc.  Non-urgent messages can be sent to your provider as well.   To learn more about what you can do with MyChart, go to [email or web address].    Your next appointment:   12 month(s)  Provider:   Verne Carrow, MD  1   Other Instructions Your physician has requested that you regularly monitor and record your blood pressure readings at home. Please use the same machine at the same time of day  to check your readings and record them to bring to your follow-up visit.   Please monitor blood pressures and keep a log of your readings.    Make sure to check 2 hours after your medications in the a.m., then take it in the afternoon    AVOID these things for 30 minutes before checking your blood pressure: No Drinking caffeine. No Drinking alcohol. No Eating. No Smoking. No Exercising.   Five minutes before checking your blood pressure: Pee. Sit in a dining chair. Avoid sitting in a soft couch or armchair. Be quiet. Do not talk   Date: _______________________ a.m. _____________________(1st reading) _____________________(2nd reading) p.m. _____________________(1st reading) _____________________(2nd reading) Date: _______________________ a.m. _____________________(1st reading) _____________________(2nd reading) p.m. _____________________(1st reading) _____________________(2nd reading) Date: _______________________ a.m. _____________________(1st reading) _____________________(2nd reading) p.m. _____________________(1st reading) _____________________(2nd reading) Date: _______________________ a.m. _____________________(1st reading) _____________________(2nd reading) p.m. _____________________(1st reading) _____________________(2nd reading) Date: _______________________ a.m. _____________________(1st reading) _____________________(2nd  reading) p.m. _____________________(1st reading) _____________________(2nd reading)  Date: _______________________ a.m. _____________________(1st reading) _____________________(2nd reading) p.m. _____________________(1st reading) _____________________(2nd reading) Date: _______________________ a.m. _____________________(1st reading) _____________________(2nd reading) p.m. _____________________(1st reading) _____________________(2nd reading) Date: _______________________ a.m. _____________________(1st reading) _____________________(2nd reading) p.m. _____________________(1st reading) _____________________(2nd reading) Date: _______________________ a.m. _____________________(1st reading) _____________________(2nd reading) p.m. _____________________(1st reading) _____________________(2nd reading) Date: _______________________ a.m. _____________________(1st reading) _____________________(2nd reading) p.m. _____________________(1st reading) _____________________(2nd reading) Date: _______________________ a.m. _____________________(1st reading) _____________________(2nd reading) p.m. _____________________(1st reading) _____________________(2nd reading) Date: _______________________ a.m. _____________________(1st reading) _____________________(2nd reading) p.m. _____________________(1st reading) _____________________(2nd reading) Date: _______________________ a.m. _____________________(1st reading) _____________________(2nd reading) p.m. _____________________(1st reading) _____________________(2nd reading) Date: _______________________ a.m. _____________________(1st reading) _____________________(2nd reading) p.m. _____________________(1st reading) _____________________(2nd reading) Date: _______________________ a.m. _____________________(1st reading) _____________________(2nd reading) p.m. _____________________(1st reading) _____________________(2nd reading)    Cora Collum, PA-C  12/26/2022 4:28 PM    [facility]

## 2022-12-26 NOTE — Patient Instructions (Signed)
Medication Instructions:  Your physician has recommended you make the following change in your medication:   RESTART Aspirin 81 mg taking 1 daily  *If you need a refill on your cardiac medications before your next appointment, please call your pharmacy*   Lab Work: None ordered  If you have labs (blood work) drawn today and your tests are completely normal, you will receive your results only by: MyChart Message (if you have MyChart) OR A paper copy in the mail If you have any lab test that is abnormal or we need to change your treatment, we will call you to review the results.   Testing/Procedures: None ordered   Follow-Up: At [facility], you and your health needs are our priority.  As part of our continuing mission to provide you with exceptional heart care, we have created designated Provider Care Teams.  These Care Teams include your primary Cardiologist (physician) and Advanced Practice Providers (APPs -  Physician Assistants and Nurse Practitioners) who all work together to provide you with the care you need, when you need it.  We recommend signing up for the patient portal called "MyChart".  Sign up information is provided on this After Visit Summary.  MyChart is used to connect with patients for Virtual Visits (Telemedicine).  Patients are able to view lab/test results, encounter notes, upcoming appointments, etc.  Non-urgent messages can be sent to your provider as well.   To learn more about what you can do with MyChart, go to [email or web address].    Your next appointment:   12 month(s)  Provider:   Verne Carrow, MD  1   Other Instructions Your physician has requested that you regularly monitor and record your blood pressure readings at home. Please use the same machine at the same time of day to check your readings and record them to bring to your follow-up visit.   Please monitor blood pressures and keep a log of your readings.    Make sure to  check 2 hours after your medications in the a.m., then take it in the afternoon    AVOID these things for 30 minutes before checking your blood pressure: No Drinking caffeine. No Drinking alcohol. No Eating. No Smoking. No Exercising.   Five minutes before checking your blood pressure: Pee. Sit in a dining chair. Avoid sitting in a soft couch or armchair. Be quiet. Do not talk   Date: _______________________ a.m. _____________________(1st reading) _____________________(2nd reading) p.m. _____________________(1st reading) _____________________(2nd reading) Date: _______________________ a.m. _____________________(1st reading) _____________________(2nd reading) p.m. _____________________(1st reading) _____________________(2nd reading) Date: _______________________ a.m. _____________________(1st reading) _____________________(2nd reading) p.m. _____________________(1st reading) _____________________(2nd reading) Date: _______________________ a.m. _____________________(1st reading) _____________________(2nd reading) p.m. _____________________(1st reading) _____________________(2nd reading) Date: _______________________ a.m. _____________________(1st reading) _____________________(2nd reading) p.m. _____________________(1st reading) _____________________(2nd reading)  Date: _______________________ a.m. _____________________(1st reading) _____________________(2nd reading) p.m. _____________________(1st reading) _____________________(2nd reading) Date: _______________________ a.m. _____________________(1st reading) _____________________(2nd reading) p.m. _____________________(1st reading) _____________________(2nd reading) Date: _______________________ a.m. _____________________(1st reading) _____________________(2nd reading) p.m. _____________________(1st reading) _____________________(2nd reading) Date: _______________________ a.m. _____________________(1st reading)  _____________________(2nd reading) p.m. _____________________(1st reading) _____________________(2nd reading) Date: _______________________ a.m. _____________________(1st reading) _____________________(2nd reading) p.m. _____________________(1st reading) _____________________(2nd reading) Date: _______________________ a.m. _____________________(1st reading) _____________________(2nd reading) p.m. _____________________(1st reading) _____________________(2nd reading) Date: _______________________ a.m. _____________________(1st reading) _____________________(2nd reading) p.m. _____________________(1st reading) _____________________(2nd reading) Date: _______________________ a.m. _____________________(1st reading) _____________________(2nd reading) p.m. _____________________(1st reading) _____________________(2nd reading) Date: _______________________ a.m. _____________________(1st reading) _____________________(2nd reading) p.m. _____________________(1st reading) _____________________(2nd reading) Date: _______________________ a.m. _____________________(1st reading) _____________________(2nd reading) p.m. _____________________(1st reading) _____________________(2nd reading)

## 2023-01-16 ENCOUNTER — Other Ambulatory Visit (HOSPITAL_COMMUNITY): Payer: Self-pay

## 2023-01-22 DIAGNOSIS — H0012 Chalazion right lower eyelid: Secondary | ICD-10-CM | POA: Diagnosis not present

## 2023-02-02 ENCOUNTER — Other Ambulatory Visit: Payer: Self-pay | Admitting: Cardiovascular Disease

## 2023-02-12 DIAGNOSIS — H0012 Chalazion right lower eyelid: Secondary | ICD-10-CM | POA: Diagnosis not present

## 2023-03-17 ENCOUNTER — Other Ambulatory Visit: Payer: Self-pay | Admitting: Family Medicine

## 2023-03-18 ENCOUNTER — Encounter: Payer: Self-pay | Admitting: *Deleted

## 2023-04-23 ENCOUNTER — Telehealth: Payer: Self-pay | Admitting: Family Medicine

## 2023-04-23 DIAGNOSIS — E1165 Type 2 diabetes mellitus with hyperglycemia: Secondary | ICD-10-CM

## 2023-04-23 MED ORDER — TRULICITY 0.75 MG/0.5ML ~~LOC~~ SOAJ: 0.7500 mg | SUBCUTANEOUS | 2 refills | Status: AC

## 2023-04-23 NOTE — Addendum Note (Signed)
Addended by: Roxanne Gates on: 04/23/2023 04:17 PM   Modules accepted: Orders

## 2023-04-23 NOTE — Telephone Encounter (Signed)
Refill sent.

## 2023-04-23 NOTE — Telephone Encounter (Signed)
Pt called & stated his pharmacy told him that they faxed a pre-authorization document over to our office to receive approval to refill the medication Dulaglutide (TRULICITY) 0.75 MG/0.5ML SOPN. Please call & advise pt if this medication can be refilled.   Patients's pharmacy is [id number] - [place], [place] - [address], [place] [address] Phone: [phone]  Fax: [phone] DEA #: [id number]

## 2023-05-30 DIAGNOSIS — H2513 Age-related nuclear cataract, bilateral: Secondary | ICD-10-CM | POA: Diagnosis not present

## 2023-05-30 DIAGNOSIS — E119 Type 2 diabetes mellitus without complications: Secondary | ICD-10-CM | POA: Diagnosis not present

## 2023-05-30 DIAGNOSIS — H52223 Regular astigmatism, bilateral: Secondary | ICD-10-CM | POA: Diagnosis not present

## 2023-05-30 DIAGNOSIS — H524 Presbyopia: Secondary | ICD-10-CM | POA: Diagnosis not present

## 2023-05-30 DIAGNOSIS — H5213 Myopia, bilateral: Secondary | ICD-10-CM | POA: Diagnosis not present

## 2023-05-30 DIAGNOSIS — H04123 Dry eye syndrome of bilateral lacrimal glands: Secondary | ICD-10-CM | POA: Diagnosis not present

## 2023-05-30 LAB — HM DIABETES EYE EXAM

## 2023-05-31 ENCOUNTER — Other Ambulatory Visit: Payer: Self-pay | Admitting: Family Medicine

## 2023-05-31 DIAGNOSIS — Z1211 Encounter for screening for malignant neoplasm of colon: Secondary | ICD-10-CM

## 2023-05-31 DIAGNOSIS — Z1212 Encounter for screening for malignant neoplasm of rectum: Secondary | ICD-10-CM

## 2023-09-02 ENCOUNTER — Telehealth (INDEPENDENT_AMBULATORY_CARE_PROVIDER_SITE_OTHER): Payer: BC Managed Care – PPO | Admitting: Family Medicine

## 2023-09-02 DIAGNOSIS — R21 Rash and other nonspecific skin eruption: Secondary | ICD-10-CM | POA: Diagnosis not present

## 2023-09-02 MED ORDER — TRIAMCINOLONE ACETONIDE 0.1 % EX CREA: 1.0000 | TOPICAL_CREAM | Freq: Two times a day (BID) | CUTANEOUS | 0 refills | Status: DC

## 2023-09-02 NOTE — Progress Notes (Signed)
 MyChart Video Visit    Virtual Visit via Video Note   This patient is at least at moderate risk for complications without adequate follow up. This format is felt to be most appropriate for this patient at this time. Physical exam was limited by quality of the video and audio technology used for the visit. [name] was able to get the patient set up on a video visit.  Patient location: home Patient and provider in visit Provider location: Office  I discussed the limitations of evaluation and management by telemedicine and the availability of in person appointments. The patient expressed understanding and agreed to proceed.  Visit Date: 09/02/2023  Today's healthcare provider: [name] Antonio [name], DO     Subjective:    Patient ID: Chad Chase, male    DOB: [birth year], 53 y.o.   MRN: [id number]  Chief Complaint  Patient presents with   breakout on face    Prsent for 3 weeks/month,  Hasn't tried anything new face product wise.  On nose and around eyes, little bumps, no white heads    HPI Patient is in today for rash on face. Discussed the use of AI scribe software for clinical note transcription with the patient, who gave verbal consent to proceed.  History of Present Illness   The patient presents with a primary concern of persistent facial bumps, specifically located on the forehead, around and underneath the eyes. These have been present for over three weeks. The patient attempted to treat the condition with an unidentified cream previously used by a family member, but this did not alleviate the symptoms. The patient reports no itching, but does experience pain upon touching the affected areas. The patient has not tried any other treatments and has returned to using his regular Aveeno wash and lotion.  In addition to the skin concern, the patient also discusses a previous prescription for Trulicity  for diabetes management. However, the patient has discontinued this medication  due to high cost. The patient has not yet explored alternative medications covered by his insurance.  The patient's medical history includes cholesterol management, for which he is prescribed Nexlizet  and rosuvastatin . The patient does not have any upcoming appointments scheduled at the time of the consultation.       Past Medical History:  Diagnosis Date   CAD (coronary artery disease)    Hypertension    10 years ago--treated with medication    Past Surgical History:  Procedure Laterality Date   CORONARY STENT INTERVENTION N/A 07/23/2017   Procedure: CORONARY STENT INTERVENTION;  Surgeon: Verlin Lonni BIRCH, MD;  Location: [facility] INVASIVE CV LAB;  Service: Cardiovascular;  Laterality: N/A;   CORONARY/GRAFT ACUTE MI REVASCULARIZATION N/A 07/21/2017   Procedure: Coronary/Graft Acute MI Revascularization;  Surgeon: Verlin Lonni BIRCH, MD;  Location: [facility]AS[facility] CV LAB;  Service: Cardiovascular;  Laterality: N/A;   KNEE ARTHROSCOPY     LEFT HEART CATH AND CORONARY ANGIOGRAPHY N/A 07/21/2017   Procedure: LEFT HEART CATH AND CORONARY ANGIOGRAPHY;  Surgeon: Verlin Lonni BIRCH, MD;  Location: [facility]AS[facility] CV [facility];  Service: Cardiovascular;  Laterality: N/A;   LEFT HEART CATH AND CORONARY ANGIOGRAPHY N/A 07/23/2017   Procedure: LEFT HEART CATH AND CORONARY ANGIOGRAPHY;  Surgeon: Verlin Lonni BIRCH, MD;  Location: [facility]AS[facility];  Service: Cardiovascular;  Laterality: N/A;    Family History  Problem Relation Age of Onset   Hypertension Mother    Prostate cancer Father    Hypertension Father    Hypertension Maternal  Grandmother    Hypertension Maternal Grandfather    Heart failure Paternal Grandmother     Social History   Socioeconomic History   Marital status: Married    Spouse name: Not on file   Number of children: Not on file   Years of education: Not on file   Highest education level: Not on file  Occupational History   Occupation: self employed    Employer:  [employer]  Tobacco Use   Smoking status: Never   Smokeless tobacco: Never  Vaping Use   Vaping status: Never Used  Substance and Sexual Activity   Alcohol use: Not Currently   Drug use: No   Sexual activity: Yes    Partners: Female  Other Topics Concern   Not on file  Social History Narrative   Exercise--- at home   Social Drivers of Health   Financial Resource Strain: Not on file  Food Insecurity: Not on file  Transportation Needs: Not on file  Physical Activity: Not on file  Stress: No Stress Concern Present (08/22/2022)   [employer] of Occupational Health - Occupational Stress Questionnaire    Feeling of Stress : Only a little  Social Connections: Not on file  Intimate Partner Violence: Not on file    Outpatient Medications Prior to Visit  Medication Sig Dispense Refill   aspirin  81 MG chewable tablet Chew 1 tablet (81 mg total) by mouth daily.     blood glucose meter kit and supplies KIT Dispense based on patient and insurance preference. Use up to four times daily as directed. (FOR ICD-9 250.00, 250.01). 1 each 0   Blood Glucose Monitoring Suppl (ONETOUCH VERIO FLEX SYSTEM) w/Device KIT Use to check blood sugar once day.  Dx code: E11.9 1 kit 0   carvedilol  (COREG ) 12.5 MG tablet TAKE 1 TABLET (12.5MG  TOTAL) BY MOUTH TWICE A DAY WITH MEALS 180 tablet 0   Dulaglutide  (TRULICITY ) 0.75 MG/0.5ML SOPN Inject 0.75 mg into the skin once a week. 2 mL 2   glucose blood (ONETOUCH VERIO) test strip Use to check blood sugar once day.  Dx code: E11.9 100 each 1   NEXLIZET  180-10 MG TABS TAKE 1 TABLET BY MOUTH EVERY DAY 30 tablet 11   nitroGLYCERIN  (NITROSTAT ) 0.4 MG SL tablet Place 1 tablet (0.4 mg total) under the tongue every 5 (five) minutes as needed. 25 tablet 4   OneTouch Delica Lancets 33G MISC Use to check blood sugar once day.  Dx code: E11.9 100 each 1   rosuvastatin  (CRESTOR ) 40 MG tablet TAKE 1 TABLET BY MOUTH DAILY. 90 tablet 3   No facility-administered  medications prior to visit.    No Known Allergies  Review of Systems  Constitutional:  Negative for fever and malaise/fatigue.  HENT:  Negative for congestion.   Eyes:  Negative for blurred vision.  Respiratory:  Negative for shortness of breath.   Cardiovascular:  Negative for chest pain, palpitations and leg swelling.  Gastrointestinal:  Negative for abdominal pain, blood in stool and nausea.  Genitourinary:  Negative for dysuria and frequency.  Musculoskeletal:  Negative for falls.  Skin:  Positive for itching and rash.  Neurological:  Negative for dizziness, loss of consciousness and headaches.  Endo/Heme/Allergies:  Negative for environmental allergies.  Psychiatric/Behavioral:  Negative for depression. The patient is not nervous/anxious.        Objective:    Physical Exam Vitals and nursing note reviewed.  Skin:    Findings: Rash present. Rash is papular.  Comments: Papular rash between eyes and across nose and eyelids  Psychiatric:        Mood and Affect: Mood normal.        Behavior: Behavior normal.        Thought Content: Thought content normal.        Judgment: Judgment normal.    There were no vitals taken for this visit. Wt Readings from Last 3 Encounters:  12/26/22 228 lb 3.2 oz (103.5 kg)  06/19/22 228 lb (103.4 kg)  09/26/21 242 lb (109.8 kg)       Assessment & Plan:  Rash -     Triamcinolone  Acetonide; Apply 1 Application topically 2 (two) times daily.  Dispense: 30 g; Refill: 0  Assessment and Plan    Facial Dermatitis   He presents with bumps on his face, including the forehead, between the eyes, around the eyes, and on the eyelids, persisting for over three weeks. He attempted treatment with a cream previously used by another individual, which did not lead to improvement. The bumps are not itchy but cause mild pain upon touch, leading to a suspicion of dermatitis. We discussed the risks of steroid cream causing skin dryness and the  benefits of mixing it with CeraVe or Eucerin to maintain moisture. We will prescribe a steroid cream and recommend using CeraVe or Eucerin cream in a tub. He is instructed to mix the steroid cream with CeraVe or Eucerin cream to prevent dryness.  Diabetes Mellitus   He was previously on Trulicity  but discontinued it due to high cost. We advised checking the insurance formulary for covered diabetes medications to find a more affordable option. We will check the insurance formulary for covered diabetes medications and print out the diabetes section of the formulary for him to bring to the next appointment.  Hyperlipidemia   He is currently on Nexlizet  and rosuvastatin  for cholesterol management. There are no new concerns or changes in medication discussed.  Follow-up   We will schedule a follow-up appointment in April.        I discussed the assessment and treatment plan with the patient. The patient was provided an opportunity to ask questions and all were answered. The patient agreed with the plan and demonstrated an understanding of the instructions.   The patient was advised to call back or seek an in-person evaluation if the symptoms worsen or if the condition fails to improve as anticipated.  [name] Antonio [name], DO [facility] at [facility] [phone] (phone) [phone] (fax)  [facility]

## 2023-10-23 ENCOUNTER — Other Ambulatory Visit: Payer: Self-pay | Admitting: Family Medicine

## 2023-10-23 DIAGNOSIS — R21 Rash and other nonspecific skin eruption: Secondary | ICD-10-CM

## 2023-11-18 ENCOUNTER — Encounter: Payer: Self-pay | Admitting: Family Medicine

## 2023-11-18 ENCOUNTER — Encounter: Payer: Self-pay | Admitting: Cardiovascular Disease

## 2023-11-18 ENCOUNTER — Other Ambulatory Visit: Payer: Self-pay | Admitting: Family Medicine

## 2023-11-19 MED ORDER — CARVEDILOL 12.5 MG PO TABS: 12.5000 mg | ORAL_TABLET | Freq: Two times a day (BID) | ORAL | 0 refills | Status: DC

## 2023-12-16 ENCOUNTER — Encounter: Payer: Self-pay | Admitting: Cardiovascular Disease

## 2023-12-16 ENCOUNTER — Ambulatory Visit: Attending: Cardiovascular Disease | Admitting: Cardiovascular Disease

## 2023-12-16 ENCOUNTER — Other Ambulatory Visit: Payer: Self-pay

## 2023-12-16 VITALS — BP 154/100 | HR 48 | Ht 72.0 in | Wt 233.2 lb

## 2023-12-16 DIAGNOSIS — E785 Hyperlipidemia, unspecified: Secondary | ICD-10-CM

## 2023-12-16 DIAGNOSIS — I2510 Atherosclerotic heart disease of native coronary artery without angina pectoris: Secondary | ICD-10-CM

## 2023-12-16 DIAGNOSIS — I10 Essential (primary) hypertension: Secondary | ICD-10-CM

## 2023-12-16 DIAGNOSIS — I351 Nonrheumatic aortic (valve) insufficiency: Secondary | ICD-10-CM

## 2023-12-16 MED ORDER — AMLODIPINE BESYLATE 5 MG PO TABS: 5.0000 mg | ORAL_TABLET | Freq: Every day | ORAL | 3 refills | Status: AC

## 2023-12-16 NOTE — Patient Instructions (Addendum)
 Medication Instructions:  Your physician has recommended you make the following change in your medication:  1-START Amlodipine   5 mg by mouth daily.  *If you need a refill on your cardiac medications before your next appointment, please call your pharmacy*  Lab Work: Your physician recommends that you have lab work - BMET, CBC, Lipid and Liver panel. If you have labs (blood work) drawn today and your tests are completely normal, you will receive your results only by: MyChart Message (if you have MyChart) OR A paper copy in the mail If you have any lab test that is abnormal or we need to change your treatment, we will call you to review the results.  Testing/Procedures: Your physician has requested that you have an echocardiogram. Echocardiography is a painless test that uses sound waves to create images of your heart. It provides your doctor with information about the size and shape of your heart and how well your heart's chambers and valves are working. This procedure takes approximately one hour. There are no restrictions for this procedure. Please do NOT wear cologne, perfume, aftershave, or lotions (deodorant is allowed). Please arrive 15 minutes prior to your appointment time.  Please note: We ask at that you not bring children with you during ultrasound (echo/ vascular) testing. Due to room size and safety concerns, children are not allowed in the ultrasound rooms during exams. Our front office staff cannot provide observation of children in our lobby area while testing is being conducted. An adult accompanying a patient to their appointment will only be allowed in the ultrasound room at the discretion of the ultrasound technician under special circumstances. We apologize for any inconvenience. Follow-Up: At [facility], you and your health needs are our priority.  As part of our continuing mission to provide you with exceptional heart care, our providers are all part of one team.   This team includes your primary Cardiologist (physician) and Advanced Practice Providers or APPs (Physician Assistants and Nurse Practitioners) who all work together to provide you with the care you need, when you need it.  Your next appointment:   1 year(s)  Provider:   Antoinette Batman, MD     We recommend signing up for the patient portal called "MyChart".  Sign up information is provided on this After Visit Summary.  MyChart is used to connect with patients for Virtual Visits (Telemedicine).  Patients are able to view lab/test results, encounter notes, upcoming appointments, etc.  Non-urgent messages can be sent to your provider as well.   To learn more about what you can do with MyChart, go to [email or web address].   Other Instructions      1st Floor: - Lobby - Registration  - Pharmacy  - Lab - Cafe  2nd Floor: - PV Lab - Diagnostic Testing (echo, CT, nuclear med)  3rd Floor: - Vacant  4th Floor: - TCTS (cardiothoracic surgery) - AFib Clinic - Structural Heart Clinic - Vascular Surgery  - Vascular Ultrasound  5th Floor: - HeartCare Cardiology (general and EP) - Clinical Pharmacy for coumadin, hypertension, lipid, weight-loss medications, and med management appointments    Valet parking services will be available as well.

## 2023-12-16 NOTE — Progress Notes (Signed)
 Chief Complaint  Patient presents with   Follow-up    CAD   History of Present Illness: 53 yo male with history of CAD, HLD, aortic insufficiency and HTN who is here today for follow up. He was admitted to [facility] November 2018 with an inferior STEMI secondary to an occluded RCA. Two drug eluting stents were placed in the RCA. Staged PCI of the intermediate branch with placement of a drug eluting stent. During the staged PCI, he was found to have occlusion of the mid RCA stents, likely due to stent thrombosis from high thrombus burden and poor distal runoff due to embolization. Flow could not be restored down the RCA on repeated PCI. Echo November 2018 with LVEF=60-65%, mild MR.    He is here today for follow up. The patient denies any chest pain, dyspnea, palpitations, lower extremity edema, orthopnea, PND, dizziness, near syncope or syncope.   Primary Care Physician: Crecencio Dodge, Candida Chalk, DO  Past Medical History:  Diagnosis Date   CAD (coronary artery disease)    Hypertension    10 years ago--treated with medication    Past Surgical History:  Procedure Laterality Date   CORONARY STENT INTERVENTION N/A 07/23/2017   Procedure: CORONARY STENT INTERVENTION;  Surgeon: Odie Benne, MD;  [facility];  Service: Cardiovascular;  Laterality: N/A;   CORONARY/GRAFT ACUTE MI REVASCULARIZATION N/A 07/21/2017   Procedure: Coronary/Graft Acute MI Revascularization;  Surgeon: Odie Benne, MD;  [facility]: [facility];  Service: Cardiovascular;  Laterality: N/A;   KNEE ARTHROSCOPY     LEFT HEART CATH AND CORONARY ANGIOGRAPHY N/A 07/21/2017   Procedure: LEFT HEART CATH AND CORONARY ANGIOGRAPHY;  Surgeon: Odie Benne, MD;  [facility];  Service: Cardiovascular;  Laterality: N/A;   LEFT HEART CATH AND CORONARY ANGIOGRAPHY N/A 07/23/2017   Procedure: LEFT HEART CATH AND CORONARY ANGIOGRAPHY;  Surgeon: Odie Benne, MD;   [facility];  Service: Cardiovascular;  Laterality: N/A;    Current Outpatient Medications  Medication Sig Dispense Refill   amLODipine  (NORVASC ) 5 MG tablet Take 1 tablet (5 mg total) by mouth daily. 90 tablet 3   aspirin  81 MG chewable tablet Chew 1 tablet (81 mg total) by mouth daily.     blood glucose meter kit and supplies KIT Dispense based on patient and insurance preference. Use up to four times daily as directed. (FOR ICD-9 250.00, 250.01). 1 each 0   Blood Glucose Monitoring Suppl (ONETOUCH VERIO FLEX SYSTEM) w/Device KIT Use to check blood sugar once day.  Dx code: E11.9 1 kit 0   carvedilol  (COREG ) 12.5 MG tablet Take 1 tablet (12.5 mg total) by mouth 2 (two) times daily with a meal. 180 tablet 0   glucose blood (ONETOUCH VERIO) test strip Use to check blood sugar once day.  Dx code: E11.9 100 each 1   NEXLIZET  180-10 MG TABS TAKE 1 TABLET BY MOUTH EVERY DAY 30 tablet 11   nitroGLYCERIN  (NITROSTAT ) 0.4 MG SL tablet Place 1 tablet (0.4 mg total) under the tongue every 5 (five) minutes as needed. 25 tablet 4   OneTouch Delica Lancets 33G MISC Use to check blood sugar once day.  Dx code: E11.9 100 each 1   rosuvastatin  (CRESTOR ) 40 MG tablet TAKE 1 TABLET BY MOUTH DAILY. 90 tablet 3   Dulaglutide  (TRULICITY ) 0.75 MG/0.5ML SOPN Inject 0.75 mg into the skin once a week. (Patient not taking: Reported on 12/16/2023) 2 mL 2  triamcinolone  cream (KENALOG ) 0.1 % APPLY TO AFFECTED AREA TWICE A DAY 30 g 0   No current facility-administered medications for this visit.    No Known Allergies  Social History   Socioeconomic History   Marital status: Married    Spouse name: Not on file   Number of children: Not on file   Years of education: Not on file   Highest education level: Not on file  Occupational History   Occupation: self employed    Employer: [employer]  Tobacco Use   Smoking status: Never   Smokeless tobacco: Never  Vaping Use   Vaping status: Never Used   Substance and Sexual Activity   Alcohol use: Not Currently   Drug use: No   Sexual activity: Yes    Partners: Female  Other Topics Concern   Not on file  Social History Narrative   Exercise--- at home   Social Drivers of Health   Financial Resource Strain: Not on file  Food Insecurity: Not on file  Transportation Needs: Not on file  Physical Activity: Not on file  Stress: No Stress Concern Present (08/22/2022)   [employer] of Occupational Health - Occupational Stress Questionnaire    Feeling of Stress : Only a little  Social Connections: Not on file  Intimate Partner Violence: Not on file    Family History  Problem Relation Age of Onset   Hypertension Mother    Prostate cancer Father    Hypertension Father    Hypertension Maternal Grandmother    Hypertension Maternal Grandfather    Heart failure Paternal Grandmother     Review of Systems:  As stated in the HPI and otherwise negative.   BP (!) 154/100   Pulse (!) 48   Ht 6' (1.829 m)   Wt 105.8 kg   SpO2 96%   BMI 31.63 kg/m   Physical Examination: General: Well developed, well nourished, NAD  HEENT: OP clear, mucus membranes moist  SKIN: warm, dry. No rashes. Neuro: No focal deficits  Musculoskeletal: Muscle strength 5/5 all ext  Psychiatric: Mood and affect normal  Neck: No JVD, no carotid bruits, no thyromegaly, no lymphadenopathy.  Lungs:Clear bilaterally, no wheezes, rhonci, crackles Cardiovascular: Regular rate and rhythm. No murmurs, gallops or rubs. Abdomen:Soft. Bowel sounds present. Non-tender.  Extremities: No lower extremity edema. Pulses are 2 + in the bilateral DP/PT.  EKG:  EKG is ordered today. The ekg ordered today demonstrates  EKG Interpretation Date/Time:  Monday December 16 2023 14:37:11 EDT Ventricular Rate:  48 PR Interval:  180 QRS Duration:  94 QT Interval:  438 QTC Calculation: 391 R Axis:   20  Text Interpretation: Sinus bradycardia Confirmed by Antoinette Batman  [phone]) on 12/16/2023 2:46:40 PM    Recent Labs: No results found for requested labs within last 365 days.   Lipid Panel    Component Value Date/Time   CHOL 100 04/02/2022 1018   TRIG 117 04/02/2022 1018   HDL 24 (L) 04/02/2022 1018   CHOLHDL 4.2 04/02/2022 1018   CHOLHDL 5 06/09/2021 1054   VLDL 17.0 06/09/2021 1054   LDLCALC 54 04/02/2022 1018   LDLDIRECT 93.0 03/31/2018 0904     Wt Readings from Last 3 Encounters:  12/16/23 105.8 kg  12/26/22 103.5 kg  06/19/22 103.4 kg    Assessment and Plan:   1. CAD without angina: No chest pain. Continue ASA, statin and beta blocker.   2. HTN: BP is elevated. Will add Norvasc  5 mg daily. Continue Coreg .  BMET today.   3. HLD: LDL 53 in 2023. Continue Crestor  and Nexlizet . Check lipids and LFTS today.l   4. Aortic valve insufficiency: Mild by echo in 2018. Repeat echo now.   5. CKD: no recent labs. Will check BMET today.   Labs/ tests ordered today include:   Orders Placed This Encounter  Procedures   CBC with Differential/Platelet   Basic metabolic panel with GFR   Lipid panel   Hepatic function panel   EKG 12-Lead   ECHOCARDIOGRAM COMPLETE   Disposition:   F/U with me in 12 months  Signed, Antoinette Batman, MD 12/16/2023 3:18 PM    [facility] [address] [place], [place], [place]  [address] Phone: [phone]; Fax: [phone]

## 2023-12-27 ENCOUNTER — Ambulatory Visit (HOSPITAL_COMMUNITY): Attending: Cardiology

## 2023-12-27 DIAGNOSIS — I351 Nonrheumatic aortic (valve) insufficiency: Secondary | ICD-10-CM | POA: Diagnosis not present

## 2023-12-27 LAB — ECHOCARDIOGRAM COMPLETE
AR max vel: 2.16 cm2
AV Area VTI: 2.3 cm2
AV Area mean vel: 2.18 cm2
AV Mean grad: 4 mmHg
AV Peak grad: 7.5 mmHg
Ao pk vel: 1.37 m/s
Area-P 1/2: 3.37 cm2
Calc EF: 51.6 %
P 1/2 time: 472 ms
S' Lateral: 3.1 cm
Single Plane A2C EF: 55.7 %
Single Plane A4C EF: 55.9 %

## 2024-02-13 ENCOUNTER — Encounter: Payer: Self-pay | Admitting: Family Medicine

## 2024-02-19 ENCOUNTER — Other Ambulatory Visit: Payer: Self-pay | Admitting: Family Medicine

## 2024-02-19 ENCOUNTER — Other Ambulatory Visit: Payer: Self-pay | Admitting: Cardiovascular Disease

## 2024-02-19 ENCOUNTER — Other Ambulatory Visit: Payer: Self-pay

## 2024-02-19 MED ORDER — ROSUVASTATIN CALCIUM 40 MG PO TABS: ORAL_TABLET | ORAL | 2 refills | Status: AC

## 2024-04-04 ENCOUNTER — Other Ambulatory Visit: Payer: Self-pay | Admitting: Family Medicine

## 2024-04-15 ENCOUNTER — Other Ambulatory Visit: Payer: Self-pay | Admitting: Family Medicine

## 2024-04-15 MED ORDER — CARVEDILOL 12.5 MG PO TABS: 12.5000 mg | ORAL_TABLET | Freq: Two times a day (BID) | ORAL | 0 refills | Status: DC

## 2024-05-18 ENCOUNTER — Other Ambulatory Visit: Payer: Self-pay | Admitting: Family Medicine

## 2024-07-25 ENCOUNTER — Encounter: Payer: Self-pay | Admitting: Family Medicine

## 2024-07-25 ENCOUNTER — Encounter: Payer: Self-pay | Admitting: Cardiovascular Disease

## 2024-07-27 MED ORDER — NEXLIZET 180-10 MG PO TABS: 1.0000 | ORAL_TABLET | Freq: Every day | ORAL | 6 refills | Status: AC

## 2024-07-27 MED ORDER — CARVEDILOL 12.5 MG PO TABS: 12.5000 mg | ORAL_TABLET | Freq: Two times a day (BID) | ORAL | 0 refills | Status: DC

## 2024-08-31 ENCOUNTER — Ambulatory Visit (INDEPENDENT_AMBULATORY_CARE_PROVIDER_SITE_OTHER): Admitting: Family Medicine

## 2024-08-31 ENCOUNTER — Encounter: Payer: Self-pay | Admitting: Family Medicine

## 2024-08-31 VITALS — BP 120/78 | HR 54 | Resp 18 | Ht 72.0 in | Wt 238.2 lb

## 2024-08-31 DIAGNOSIS — I25110 Atherosclerotic heart disease of native coronary artery with unstable angina pectoris: Secondary | ICD-10-CM | POA: Diagnosis not present

## 2024-08-31 DIAGNOSIS — E1165 Type 2 diabetes mellitus with hyperglycemia: Secondary | ICD-10-CM

## 2024-08-31 DIAGNOSIS — I10 Essential (primary) hypertension: Secondary | ICD-10-CM | POA: Diagnosis not present

## 2024-08-31 DIAGNOSIS — Z7985 Long-term (current) use of injectable non-insulin antidiabetic drugs: Secondary | ICD-10-CM | POA: Diagnosis not present

## 2024-08-31 DIAGNOSIS — I110 Hypertensive heart disease with heart failure: Secondary | ICD-10-CM

## 2024-08-31 DIAGNOSIS — E785 Hyperlipidemia, unspecified: Secondary | ICD-10-CM | POA: Diagnosis not present

## 2024-08-31 DIAGNOSIS — I252 Old myocardial infarction: Secondary | ICD-10-CM | POA: Diagnosis not present

## 2024-08-31 MED ORDER — CARVEDILOL 12.5 MG PO TABS: 12.5000 mg | ORAL_TABLET | Freq: Two times a day (BID) | ORAL | 1 refills | Status: AC

## 2024-08-31 NOTE — Assessment & Plan Note (Signed)
 hgba1c to be checked, minimize simple carbs. Increase exercise as tolerated. Continue current meds

## 2024-08-31 NOTE — Assessment & Plan Note (Signed)
 Encourage heart healthy diet such as MIND or DASH diet, increase exercise, avoid trans fats, simple carbohydrates and processed foods, consider a krill or fish or flaxseed oil cap daily.

## 2024-08-31 NOTE — Progress Notes (Signed)
 "     Subjective:    Patient ID: Chad Chase, male    DOB: [birth year], 54 y.o.   MRN: [id number]  Chief Complaint  Patient presents with   Hypertension   Follow-up    HPI Patient is in today for f/u bp.  Discussed the use of AI scribe software for clinical note transcription with the patient, who gave verbal consent to proceed.  History of Present Illness Chad Chase is a 54 year old male with diabetes who presents for a follow-up visit.  He is not performing regular blood glucose monitoring due to the inconvenience of finger pricking. He is interested in alternative methods for glucose monitoring, such as continuous glucose monitors (CGMs), but insurance coverage is a concern unless he is on insulin. Over-the-counter CGMs are available but are costly, around a hundred dollars a month.  He previously used Trulicity  for diabetes management but has not taken it for some time due to the high cost, even with a coupon. He is considering checking with his insurance for coverage of alternative medications in the same category, such as Ozempic or Monsero. His deductible is $5,750 under his HSA plan, which affects his medication coverage.  He discussed his current insurance situation, noting a high monthly premium of approximately $2,300 for family coverage, in addition to the deductible. He is frustrated with the rising costs of insurance and the complexity of coverage options.    Past Medical History:  Diagnosis Date   CAD (coronary artery disease)    Hypertension    10 years ago--treated with medication    Past Surgical History:  Procedure Laterality Date   CORONARY STENT INTERVENTION N/A 07/23/2017   Procedure: CORONARY STENT INTERVENTION;  Surgeon: Verlin Lonni BIRCH, MD;  [facility];  Service: Cardiovascular;  Laterality: N/A;   CORONARY/GRAFT ACUTE MI REVASCULARIZATION N/A 07/21/2017   Procedure: Coronary/Graft Acute MI Revascularization;  Surgeon:  Verlin Lonni BIRCH, MD;  [facility];  Service: Cardiovascular;  Laterality: N/A;   KNEE ARTHROSCOPY     LEFT HEART CATH AND CORONARY ANGIOGRAPHY N/A 07/21/2017   Procedure: LEFT HEART CATH AND CORONARY ANGIOGRAPHY;  Surgeon: Verlin Lonni BIRCH, MD;  [facility];  Service: Cardiovascular;  Laterality: N/A;   LEFT HEART CATH AND CORONARY ANGIOGRAPHY N/A 07/23/2017   Procedure: LEFT HEART CATH AND CORONARY ANGIOGRAPHY;  Surgeon: Verlin Lonni BIRCH, MD;  [facility];  Service: Cardiovascular;  Laterality: N/A;    Family History  Problem Relation Age of Onset   Hypertension Mother    Prostate cancer Father    Hypertension Father    Hypertension Maternal Grandmother    Hypertension Maternal Grandfather    Heart failure Paternal Grandmother     Social History   Socioeconomic History   Marital status: Married    Spouse name: Not on file   Number of children: Not on file   Years of education: Not on file   Highest education level: Not on file  Occupational History   Occupation: self employed    Employer: [employer]  Tobacco Use   Smoking status: Never   Smokeless tobacco: Never  Vaping Use   Vaping status: Never Used  Substance and Sexual Activity   Alcohol use: Not Currently   Drug use: No   Sexual activity: Yes    Partners: Female  Other Topics Concern   Not on file  Social History Narrative   Exercise--- at home  Social Drivers of Health   Tobacco Use: Low Risk (08/31/2024)   Patient History    Smoking Tobacco Use: Never    Smokeless Tobacco Use: Never    Passive Exposure: Not on file  Financial Resource Strain: Not on file  Food Insecurity: Not on file  Transportation Needs: Not on file  Physical Activity: Not on file  Stress: No Stress Concern Present (08/22/2022)   [employer] of Occupational Health - Occupational Stress Questionnaire    Feeling of Stress : Only a little  Social Connections:  Not on file  Intimate Partner Violence: Not on file  Depression (PHQ2-9): Low Risk (08/31/2024)   Depression (PHQ2-9)    PHQ-2 Score: 0  Alcohol Screen: Not on file  Housing: Not on file  Utilities: Not on file  Health Literacy: Not on file    Outpatient Medications Prior to Visit  Medication Sig Dispense Refill   amLODipine  (NORVASC ) 5 MG tablet Take 1 tablet (5 mg total) by mouth daily. 90 tablet 3   aspirin  81 MG chewable tablet Chew 1 tablet (81 mg total) by mouth daily.     Bempedoic Acid-Ezetimibe  (NEXLIZET ) 180-10 MG TABS Take 1 tablet by mouth daily. 30 tablet 6   blood glucose meter kit and supplies KIT Dispense based on patient and insurance preference. Use up to four times daily as directed. (FOR ICD-9 250.00, 250.01). 1 each 0   Blood Glucose Monitoring Suppl (ONETOUCH VERIO FLEX SYSTEM) w/Device KIT Use to check blood sugar once day.  Dx code: E11.9 1 kit 0   glucose blood (ONETOUCH VERIO) test strip Use to check blood sugar once day.  Dx code: E11.9 100 each 1   nitroGLYCERIN  (NITROSTAT ) 0.4 MG SL tablet Place 1 tablet (0.4 mg total) under the tongue every 5 (five) minutes as needed. 25 tablet 4   OneTouch Delica Lancets 33G MISC Use to check blood sugar once day.  Dx code: E11.9 100 each 1   rosuvastatin  (CRESTOR ) 40 MG tablet TAKE 1 TABLET BY MOUTH DAILY. 90 tablet 2   carvedilol  (COREG ) 12.5 MG tablet Take 1 tablet (12.5 mg total) by mouth 2 (two) times daily with a meal. Needs appt 60 tablet 0   Dulaglutide  (TRULICITY ) 0.75 MG/0.5ML SOPN Inject 0.75 mg into the skin once a week. (Patient not taking: Reported on 08/31/2024) 2 mL 2   triamcinolone  cream (KENALOG ) 0.1 % APPLY TO AFFECTED AREA TWICE A DAY 30 g 0   No facility-administered medications prior to visit.    No Known Allergies  Review of Systems  Constitutional:  Negative for chills, fever and malaise/fatigue.  HENT:  Negative for congestion and hearing loss.   Eyes:  Negative for discharge.  Respiratory:   Negative for cough, sputum production and shortness of breath.   Cardiovascular:  Negative for chest pain, palpitations and leg swelling.  Gastrointestinal:  Negative for abdominal pain, blood in stool, constipation, diarrhea, heartburn, nausea and vomiting.  Genitourinary:  Negative for dysuria, frequency, hematuria and urgency.  Musculoskeletal:  Negative for back pain, falls and myalgias.  Skin:  Negative for rash.  Neurological:  Negative for dizziness, sensory change, loss of consciousness, weakness and headaches.  Endo/Heme/Allergies:  Negative for environmental allergies. Does not bruise/bleed easily.  Psychiatric/Behavioral:  Negative for depression and suicidal ideas. The patient is not nervous/anxious and does not have insomnia.        Objective:    Physical Exam Vitals and nursing note reviewed.  Constitutional:      General:  He is not in acute distress.    Appearance: Normal appearance. He is well-developed.  HENT:     Head: Normocephalic and atraumatic.  Eyes:     General: No scleral icterus.       Right eye: No discharge.        Left eye: No discharge.  Cardiovascular:     Rate and Rhythm: Normal rate and regular rhythm.     Heart sounds: No murmur heard. Pulmonary:     Effort: Pulmonary effort is normal. No respiratory distress.     Breath sounds: Normal breath sounds.  Musculoskeletal:        General: Normal range of motion.     Cervical back: Normal range of motion and neck supple.     Right lower leg: No edema.     Left lower leg: No edema.  Skin:    General: Skin is warm and dry.  Neurological:     Mental Status: He is alert and oriented to person, place, and time.  Psychiatric:        Mood and Affect: Mood normal.        Behavior: Behavior normal.        Thought Content: Thought content normal.        Judgment: Judgment normal.     BP 120/78 (BP Location: Left Arm, Patient Position: Sitting, Cuff Size: Large)   Pulse (!) 54   Resp 18   Ht 6'  (1.829 m)   Wt 238 lb 3.2 oz (108 kg)   SpO2 98%   BMI 32.31 kg/m  Wt Readings from Last 3 Encounters:  08/31/24 238 lb 3.2 oz (108 kg)  12/16/23 233 lb 3.2 oz (105.8 kg)  12/26/22 228 lb 3.2 oz (103.5 kg)    Diabetic Foot Exam - Simple   No data filed    Lab Results  Component Value Date   WBC 5.6 06/19/2022   HGB 12.9 (L) 06/19/2022   HCT 40.2 06/19/2022   PLT 297.0 06/19/2022   GLUCOSE 87 06/19/2022   CHOL 100 04/02/2022   TRIG 117 04/02/2022   HDL 24 (L) 04/02/2022   LDLDIRECT 93.0 03/31/2018   LDLCALC 54 04/02/2022   ALT 32 06/19/2022   AST 45 (H) 06/19/2022   NA 135 06/19/2022   K 4.4 06/19/2022   CL 98 06/19/2022   CREATININE 1.54 (H) 06/19/2022   BUN 19 06/19/2022   CO2 29 06/19/2022   TSH 1.31 06/19/2022   PSA 0.20 06/19/2022   INR 0.94 07/22/2017   HGBA1C 7.3 (H) 06/19/2022    Lab Results  Component Value Date   TSH 1.31 06/19/2022   Lab Results  Component Value Date   WBC 5.6 06/19/2022   HGB 12.9 (L) 06/19/2022   HCT 40.2 06/19/2022   MCV 87.0 06/19/2022   PLT 297.0 06/19/2022   Lab Results  Component Value Date   NA 135 06/19/2022   K 4.4 06/19/2022   CO2 29 06/19/2022   GLUCOSE 87 06/19/2022   BUN 19 06/19/2022   CREATININE 1.54 (H) 06/19/2022   BILITOT 0.8 06/19/2022   ALKPHOS 65 06/19/2022   AST 45 (H) 06/19/2022   ALT 32 06/19/2022   PROT 7.3 06/19/2022   ALBUMIN 4.3 06/19/2022   CALCIUM  9.8 06/19/2022   ANIONGAP 7 10/21/2018   EGFR 51 (L) 04/02/2022   GFR 51.65 (L) 06/19/2022   Lab Results  Component Value Date   CHOL 100 04/02/2022   Lab Results  Component Value Date  HDL 24 (L) 04/02/2022   Lab Results  Component Value Date   LDLCALC 54 04/02/2022   Lab Results  Component Value Date   TRIG 117 04/02/2022   Lab Results  Component Value Date   CHOLHDL 4.2 04/02/2022   Lab Results  Component Value Date   HGBA1C 7.3 (H) 06/19/2022       Assessment & Plan:  Primary hypertension Assessment & Plan: Well  controlled, no changes to meds. Encouraged heart healthy diet such as the DASH diet and exercise as tolerated.    Orders: -     Carvedilol ; Take 1 tablet (12.5 mg total) by mouth 2 (two) times daily with a meal. Needs appt  Dispense: 120 tablet; Refill: 1 -     Comprehensive metabolic panel with GFR -     Hemoglobin A1c -     Microalbumin / creatinine urine ratio  Type 2 diabetes mellitus with hyperglycemia, without long-term current use of insulin (HCC) Assessment & Plan: hgba1c to be checked, minimize simple carbs. Increase exercise as tolerated. Continue current meds   Orders: -     Lipid panel -     CBC with Differential/Platelet -     Comprehensive metabolic panel with GFR -     Hemoglobin A1c -     Microalbumin / creatinine urine ratio  Hypertensive heart disease with heart failure (HCC) Assessment & Plan: Per cardiology   Hyperlipidemia, unspecified hyperlipidemia type Assessment & Plan: Encourage heart healthy diet such as MIND or DASH diet, increase exercise, avoid trans fats, simple carbohydrates and processed foods, consider a krill or fish or flaxseed oil cap daily.     Coronary artery disease involving native coronary artery of native heart with unstable angina pectoris [facility]) Assessment & Plan: Encourage heart healthy diet such as MIND or DASH diet, increase exercise, avoid trans fats, simple carbohydrates and processed foods, consider a krill or fish or flaxseed oil cap daily.     History of acute myocardial infarction Assessment & Plan: Per cardiology   Assessment and Plan Assessment & Plan Type 2 diabetes mellitus with hyperglycemia   Currently managed with Trulicity , but cost is a concern. Discussed insurance coverage for alternatives like Ozempic or Monsero. Over-the-counter CGMs are an option, though expensive. He has not been using fingerstick glucose monitoring. Check with insurance for coverage of alternative medications. Consider over-the-counter CGMs  if insurance does not cover alternatives.  Primary hypertension   Blood pressure is well-controlled with carvedilol . Continue carvedilol  for blood pressure management.    Esti Demello R Lowne Chase, DO  "

## 2024-08-31 NOTE — Assessment & Plan Note (Signed)
"   Per cardiology         "

## 2024-08-31 NOTE — Assessment & Plan Note (Signed)
 Well controlled, no changes to meds. Encouraged heart healthy diet such as the DASH diet and exercise as tolerated.

## 2024-09-01 LAB — CBC WITH DIFFERENTIAL/PLATELET
Basophils Absolute: 0 K/uL (ref 0.0–0.1)
Basophils Relative: 1 % (ref 0.0–3.0)
Eosinophils Absolute: 0.1 K/uL (ref 0.0–0.7)
Eosinophils Relative: 2.6 % (ref 0.0–5.0)
HCT: 39.1 % (ref 39.0–52.0)
Hemoglobin: 12.7 g/dL — ABNORMAL LOW (ref 13.0–17.0)
Lymphocytes Relative: 42.3 % (ref 12.0–46.0)
Lymphs Abs: 1.8 K/uL (ref 0.7–4.0)
MCHC: 32.5 g/dL (ref 30.0–36.0)
MCV: 84.3 fl (ref 78.0–100.0)
Monocytes Absolute: 0.5 K/uL (ref 0.1–1.0)
Monocytes Relative: 11 % (ref 3.0–12.0)
Neutro Abs: 1.8 K/uL (ref 1.4–7.7)
Neutrophils Relative %: 43.1 % (ref 43.0–77.0)
Platelets: 323 K/uL (ref 150.0–400.0)
RBC: 4.63 Mil/uL (ref 4.22–5.81)
RDW: 13.8 % (ref 11.5–15.5)
WBC: 4.3 K/uL (ref 4.0–10.5)

## 2024-09-01 LAB — LIPID PANEL
Cholesterol: 100 mg/dL (ref 28–200)
HDL: 26 mg/dL — ABNORMAL LOW
LDL Cholesterol: 59 mg/dL (ref 10–99)
NonHDL: 73.96
Total CHOL/HDL Ratio: 4
Triglycerides: 73 mg/dL (ref 10.0–149.0)
VLDL: 14.6 mg/dL (ref 0.0–40.0)

## 2024-09-01 LAB — COMPREHENSIVE METABOLIC PANEL WITH GFR
ALT: 34 U/L (ref 3–53)
AST: 34 U/L (ref 5–37)
Albumin: 4.5 g/dL (ref 3.5–5.2)
Alkaline Phosphatase: 76 U/L (ref 39–117)
BUN: 13 mg/dL (ref 6–23)
CO2: 29 meq/L (ref 19–32)
Calcium: 9.4 mg/dL (ref 8.4–10.5)
Chloride: 101 meq/L (ref 96–112)
Creatinine, Ser: 1.18 mg/dL (ref 0.40–1.50)
GFR: 70.01 mL/min
Glucose, Bld: 111 mg/dL — ABNORMAL HIGH (ref 70–99)
Potassium: 4.3 meq/L (ref 3.5–5.1)
Sodium: 138 meq/L (ref 135–145)
Total Bilirubin: 0.5 mg/dL (ref 0.2–1.2)
Total Protein: 7.3 g/dL (ref 6.0–8.3)

## 2024-09-01 LAB — MICROALBUMIN / CREATININE URINE RATIO
Creatinine,U: 40.4 mg/dL
Microalb Creat Ratio: UNDETERMINED mg/g (ref 0.0–30.0)
Microalb, Ur: <0.7 mg/dL

## 2024-09-01 LAB — HEMOGLOBIN A1C: Hgb A1c MFr Bld: 8.3 % — ABNORMAL HIGH (ref 4.6–6.5)

## 2024-09-05 ENCOUNTER — Ambulatory Visit: Payer: Self-pay | Admitting: Family Medicine

## 2024-09-05 ENCOUNTER — Other Ambulatory Visit: Payer: Self-pay | Admitting: Family Medicine

## 2024-09-05 DIAGNOSIS — E1165 Type 2 diabetes mellitus with hyperglycemia: Secondary | ICD-10-CM

## 2024-09-05 DIAGNOSIS — I252 Old myocardial infarction: Secondary | ICD-10-CM

## 2024-09-05 DIAGNOSIS — I25110 Atherosclerotic heart disease of native coronary artery with unstable angina pectoris: Secondary | ICD-10-CM

## 2024-09-05 MED ORDER — OZEMPIC (0.25 OR 0.5 MG/DOSE) 2 MG/3ML ~~LOC~~ SOPN: PEN_INJECTOR | SUBCUTANEOUS | 1 refills | Status: AC

## 2024-09-09 ENCOUNTER — Other Ambulatory Visit (HOSPITAL_COMMUNITY): Payer: Self-pay

## 2024-09-09 ENCOUNTER — Telehealth: Payer: Self-pay

## 2024-09-09 NOTE — Telephone Encounter (Signed)
 Pharmacy Patient Advocate Encounter   Received notification from Physician's Office that prior authorization for Ozempic  is required/requested.   Insurance verification completed.   The patient is insured through [facility].   Per test claim: PA required; PA submitted to above mentioned insurance via Latent Key/confirmation #/EOC [id number] Status is pending

## 2024-09-09 NOTE — Telephone Encounter (Signed)
 Spoke with patient. Pt states pharmacy needs a PA for Ozempic . Please advise

## 2024-09-10 ENCOUNTER — Other Ambulatory Visit (HOSPITAL_COMMUNITY): Payer: Self-pay

## 2024-09-10 NOTE — Telephone Encounter (Signed)
 Pharmacy Patient Advocate Encounter  Received notification from [facility] that Prior Authorization for Ozempic  has been APPROVED from 09/09/2024 to 09/09/2025. Ran test claim, Copay is $1,003.08. This test claim was processed through [facility]- copay amounts may vary at other pharmacies due to pharmacy/plan contracts, or as the patient moves through the different stages of their insurance plan.    PA #/Case ID/Reference #: [id number]

## 2024-09-25 LAB — OPHTHALMOLOGY REPORT-SCANNED

## 2024-12-30 ENCOUNTER — Ambulatory Visit: Admitting: Cardiovascular Disease
